# Patient Record
Sex: Female | Born: 1960 | Race: Black or African American | Hispanic: No | Smoking: Current every day smoker
Health system: Southern US, Community
[De-identification: ages and names within clinical notes are randomized; demographics above are authoritative.]

## PROBLEM LIST (undated history)

## (undated) DIAGNOSIS — F419 Anxiety disorder, unspecified: Secondary | ICD-10-CM

## (undated) DIAGNOSIS — F329 Major depressive disorder, single episode, unspecified: Secondary | ICD-10-CM

## (undated) DIAGNOSIS — M25569 Pain in unspecified knee: Secondary | ICD-10-CM

## (undated) DIAGNOSIS — Z8711 Personal history of peptic ulcer disease: Secondary | ICD-10-CM

## (undated) DIAGNOSIS — M25552 Pain in left hip: Secondary | ICD-10-CM

## (undated) DIAGNOSIS — K219 Gastro-esophageal reflux disease without esophagitis: Secondary | ICD-10-CM

## (undated) DIAGNOSIS — J449 Chronic obstructive pulmonary disease, unspecified: Secondary | ICD-10-CM

## (undated) DIAGNOSIS — F32A Depression, unspecified: Secondary | ICD-10-CM

## (undated) DIAGNOSIS — M549 Dorsalgia, unspecified: Secondary | ICD-10-CM

## (undated) DIAGNOSIS — M25579 Pain in unspecified ankle and joints of unspecified foot: Secondary | ICD-10-CM

## (undated) DIAGNOSIS — M87059 Idiopathic aseptic necrosis of unspecified femur: Secondary | ICD-10-CM

## (undated) DIAGNOSIS — Z8719 Personal history of other diseases of the digestive system: Secondary | ICD-10-CM

## (undated) DIAGNOSIS — I10 Essential (primary) hypertension: Secondary | ICD-10-CM

## (undated) DIAGNOSIS — G8929 Other chronic pain: Secondary | ICD-10-CM

## (undated) DIAGNOSIS — F1010 Alcohol abuse, uncomplicated: Secondary | ICD-10-CM

## (undated) DIAGNOSIS — R21 Rash and other nonspecific skin eruption: Secondary | ICD-10-CM

## (undated) HISTORY — PX: WISDOM TOOTH EXTRACTION: SHX21

## (undated) HISTORY — DX: Gastro-esophageal reflux disease without esophagitis: K21.9

## (undated) HISTORY — PX: OTHER SURGICAL HISTORY: SHX169

## (undated) HISTORY — PX: TOTAL ABDOMINAL HYSTERECTOMY: SHX209

## (undated) HISTORY — DX: Pain in left hip: M25.552

## (undated) HISTORY — DX: Chronic obstructive pulmonary disease, unspecified: J44.9

## (undated) HISTORY — PX: TUBAL LIGATION: SHX77

## (undated) HISTORY — DX: Essential (primary) hypertension: I10

## (undated) HISTORY — PX: NASAL SINUS SURGERY: SHX719

---

## 2001-05-22 ENCOUNTER — Encounter: Payer: Self-pay | Admitting: General Surgery

## 2001-05-25 ENCOUNTER — Ambulatory Visit (HOSPITAL_COMMUNITY): Admission: RE | Admit: 2001-05-25 | Discharge: 2001-05-25 | Payer: Self-pay | Admitting: General Surgery

## 2002-10-08 ENCOUNTER — Emergency Department (HOSPITAL_COMMUNITY): Admission: EM | Admit: 2002-10-08 | Discharge: 2002-10-08 | Payer: Self-pay | Admitting: Emergency Medicine

## 2002-10-08 ENCOUNTER — Encounter: Payer: Self-pay | Admitting: Emergency Medicine

## 2003-05-24 ENCOUNTER — Encounter: Payer: Self-pay | Admitting: Emergency Medicine

## 2003-05-24 ENCOUNTER — Encounter: Payer: Self-pay | Admitting: Internal Medicine

## 2003-05-24 ENCOUNTER — Inpatient Hospital Stay (HOSPITAL_COMMUNITY): Admission: EM | Admit: 2003-05-24 | Discharge: 2003-05-27 | Payer: Self-pay | Admitting: Emergency Medicine

## 2003-05-25 ENCOUNTER — Encounter: Payer: Self-pay | Admitting: Cardiology

## 2004-05-29 ENCOUNTER — Observation Stay (HOSPITAL_COMMUNITY): Admission: EM | Admit: 2004-05-29 | Discharge: 2004-05-30 | Payer: Self-pay | Admitting: Emergency Medicine

## 2004-05-29 ENCOUNTER — Ambulatory Visit: Payer: Self-pay | Admitting: Internal Medicine

## 2004-09-14 ENCOUNTER — Emergency Department (HOSPITAL_COMMUNITY): Admission: AC | Admit: 2004-09-14 | Discharge: 2004-09-15 | Payer: Self-pay

## 2004-12-31 ENCOUNTER — Emergency Department (HOSPITAL_COMMUNITY): Admission: EM | Admit: 2004-12-31 | Discharge: 2004-12-31 | Payer: Self-pay | Admitting: Emergency Medicine

## 2007-01-08 ENCOUNTER — Emergency Department (HOSPITAL_COMMUNITY): Admission: EM | Admit: 2007-01-08 | Discharge: 2007-01-08 | Payer: Self-pay | Admitting: Emergency Medicine

## 2007-10-27 ENCOUNTER — Encounter: Payer: Self-pay | Admitting: Orthopedic Surgery

## 2007-10-27 ENCOUNTER — Emergency Department (HOSPITAL_COMMUNITY): Admission: EM | Admit: 2007-10-27 | Discharge: 2007-10-27 | Payer: Self-pay | Admitting: Emergency Medicine

## 2008-01-03 ENCOUNTER — Emergency Department (HOSPITAL_COMMUNITY): Admission: EM | Admit: 2008-01-03 | Discharge: 2008-01-03 | Payer: Self-pay | Admitting: Emergency Medicine

## 2008-01-20 ENCOUNTER — Inpatient Hospital Stay (HOSPITAL_COMMUNITY): Admission: EM | Admit: 2008-01-20 | Discharge: 2008-01-23 | Payer: Self-pay | Admitting: Emergency Medicine

## 2008-03-15 ENCOUNTER — Encounter: Payer: Self-pay | Admitting: Physician Assistant

## 2008-04-11 ENCOUNTER — Encounter: Payer: Self-pay | Admitting: Physician Assistant

## 2008-04-25 ENCOUNTER — Encounter: Payer: Self-pay | Admitting: Physician Assistant

## 2008-05-05 ENCOUNTER — Encounter: Payer: Self-pay | Admitting: Physician Assistant

## 2008-05-05 ENCOUNTER — Ambulatory Visit: Payer: Self-pay | Admitting: Cardiology

## 2008-05-11 ENCOUNTER — Ambulatory Visit: Payer: Self-pay | Admitting: Cardiology

## 2008-10-04 ENCOUNTER — Emergency Department (HOSPITAL_COMMUNITY): Admission: EM | Admit: 2008-10-04 | Discharge: 2008-10-04 | Payer: Self-pay | Admitting: Emergency Medicine

## 2008-11-12 ENCOUNTER — Encounter: Payer: Self-pay | Admitting: Physician Assistant

## 2009-01-07 ENCOUNTER — Encounter: Payer: Self-pay | Admitting: Orthopedic Surgery

## 2009-01-25 ENCOUNTER — Ambulatory Visit (HOSPITAL_COMMUNITY): Admission: RE | Admit: 2009-01-25 | Discharge: 2009-01-25 | Payer: Self-pay | Admitting: Family Medicine

## 2009-01-25 ENCOUNTER — Encounter: Payer: Self-pay | Admitting: Orthopedic Surgery

## 2009-02-01 ENCOUNTER — Ambulatory Visit: Payer: Self-pay | Admitting: Orthopedic Surgery

## 2009-02-01 DIAGNOSIS — M8700 Idiopathic aseptic necrosis of unspecified bone: Secondary | ICD-10-CM | POA: Insufficient documentation

## 2009-02-03 ENCOUNTER — Emergency Department (HOSPITAL_COMMUNITY): Admission: EM | Admit: 2009-02-03 | Discharge: 2009-02-03 | Payer: Self-pay | Admitting: Emergency Medicine

## 2009-02-05 ENCOUNTER — Emergency Department (HOSPITAL_COMMUNITY): Admission: EM | Admit: 2009-02-05 | Discharge: 2009-02-05 | Payer: Self-pay | Admitting: Emergency Medicine

## 2009-02-08 ENCOUNTER — Emergency Department (HOSPITAL_COMMUNITY): Admission: EM | Admit: 2009-02-08 | Discharge: 2009-02-08 | Payer: Self-pay | Admitting: Emergency Medicine

## 2009-04-03 ENCOUNTER — Ambulatory Visit: Payer: Self-pay | Admitting: Orthopedic Surgery

## 2009-05-10 ENCOUNTER — Ambulatory Visit: Payer: Self-pay | Admitting: Orthopedic Surgery

## 2009-05-10 ENCOUNTER — Telehealth: Payer: Self-pay | Admitting: Orthopedic Surgery

## 2009-05-10 DIAGNOSIS — M5126 Other intervertebral disc displacement, lumbar region: Secondary | ICD-10-CM

## 2009-05-25 ENCOUNTER — Emergency Department (HOSPITAL_COMMUNITY): Admission: EM | Admit: 2009-05-25 | Discharge: 2009-05-25 | Payer: Self-pay | Admitting: Emergency Medicine

## 2009-06-08 ENCOUNTER — Encounter: Payer: Self-pay | Admitting: Orthopedic Surgery

## 2009-07-14 ENCOUNTER — Ambulatory Visit (HOSPITAL_COMMUNITY): Admission: RE | Admit: 2009-07-14 | Discharge: 2009-07-14 | Payer: Self-pay | Admitting: Family Medicine

## 2009-07-14 ENCOUNTER — Encounter (INDEPENDENT_AMBULATORY_CARE_PROVIDER_SITE_OTHER): Payer: Self-pay

## 2009-07-14 LAB — CONVERTED CEMR LAB
ALT: 19 units/L
AST: 33 units/L
Alkaline Phosphatase: 59 units/L
BUN: 16 mg/dL
Chloride: 103 meq/L
MCV: 94.4 fL
Platelets: 259 10*3/uL
Potassium: 3.2 meq/L
Sodium: 140 meq/L
Total Protein: 7.5 g/dL

## 2009-07-17 ENCOUNTER — Ambulatory Visit (HOSPITAL_COMMUNITY): Admission: RE | Admit: 2009-07-17 | Discharge: 2009-07-17 | Payer: Self-pay | Admitting: Family Medicine

## 2009-08-24 ENCOUNTER — Ambulatory Visit (HOSPITAL_COMMUNITY): Admission: RE | Admit: 2009-08-24 | Discharge: 2009-08-24 | Payer: Self-pay | Admitting: Family Medicine

## 2009-08-30 ENCOUNTER — Ambulatory Visit (HOSPITAL_COMMUNITY): Payer: Self-pay | Admitting: Oncology

## 2009-09-17 ENCOUNTER — Emergency Department (HOSPITAL_COMMUNITY): Admission: EM | Admit: 2009-09-17 | Discharge: 2009-09-17 | Payer: Self-pay | Admitting: Emergency Medicine

## 2009-09-18 ENCOUNTER — Telehealth: Payer: Self-pay | Admitting: Orthopedic Surgery

## 2009-09-20 ENCOUNTER — Telehealth: Payer: Self-pay | Admitting: Orthopedic Surgery

## 2009-09-22 ENCOUNTER — Encounter: Payer: Self-pay | Admitting: Orthopedic Surgery

## 2009-09-22 ENCOUNTER — Telehealth (INDEPENDENT_AMBULATORY_CARE_PROVIDER_SITE_OTHER): Payer: Self-pay | Admitting: *Deleted

## 2009-09-22 ENCOUNTER — Ambulatory Visit (HOSPITAL_COMMUNITY): Admission: RE | Admit: 2009-09-22 | Discharge: 2009-09-22 | Payer: Self-pay | Admitting: Family Medicine

## 2009-09-25 ENCOUNTER — Ambulatory Visit: Payer: Self-pay | Admitting: Genetic Counselor

## 2009-10-10 ENCOUNTER — Telehealth: Payer: Self-pay | Admitting: Orthopedic Surgery

## 2009-10-11 ENCOUNTER — Ambulatory Visit (HOSPITAL_COMMUNITY): Admission: RE | Admit: 2009-10-11 | Discharge: 2009-10-11 | Payer: Self-pay | Admitting: Orthopedic Surgery

## 2009-10-11 ENCOUNTER — Encounter: Payer: Self-pay | Admitting: Orthopedic Surgery

## 2009-10-13 ENCOUNTER — Ambulatory Visit (HOSPITAL_COMMUNITY): Admission: RE | Admit: 2009-10-13 | Discharge: 2009-10-13 | Payer: Self-pay | Admitting: Family Medicine

## 2009-10-18 ENCOUNTER — Ambulatory Visit: Payer: Self-pay | Admitting: Orthopedic Surgery

## 2009-10-18 ENCOUNTER — Encounter (INDEPENDENT_AMBULATORY_CARE_PROVIDER_SITE_OTHER): Payer: Self-pay | Admitting: *Deleted

## 2009-10-18 DIAGNOSIS — D1620 Benign neoplasm of long bones of unspecified lower limb: Secondary | ICD-10-CM

## 2009-10-18 DIAGNOSIS — M5430 Sciatica, unspecified side: Secondary | ICD-10-CM

## 2009-11-10 ENCOUNTER — Emergency Department (HOSPITAL_COMMUNITY): Admission: EM | Admit: 2009-11-10 | Discharge: 2009-11-10 | Payer: Self-pay | Admitting: Emergency Medicine

## 2009-11-29 ENCOUNTER — Encounter (HOSPITAL_COMMUNITY): Admission: RE | Admit: 2009-11-29 | Discharge: 2009-12-29 | Payer: Self-pay | Admitting: Oncology

## 2009-11-29 ENCOUNTER — Encounter: Payer: Self-pay | Admitting: Orthopedic Surgery

## 2009-11-29 ENCOUNTER — Ambulatory Visit (HOSPITAL_COMMUNITY): Payer: Self-pay | Admitting: Oncology

## 2009-12-13 ENCOUNTER — Telehealth: Payer: Self-pay | Admitting: Orthopedic Surgery

## 2009-12-20 ENCOUNTER — Ambulatory Visit: Payer: Self-pay | Admitting: Orthopedic Surgery

## 2009-12-20 DIAGNOSIS — M23329 Other meniscus derangements, posterior horn of medial meniscus, unspecified knee: Secondary | ICD-10-CM

## 2009-12-21 ENCOUNTER — Encounter: Payer: Self-pay | Admitting: Orthopedic Surgery

## 2009-12-25 ENCOUNTER — Telehealth: Payer: Self-pay | Admitting: Orthopedic Surgery

## 2009-12-26 ENCOUNTER — Telehealth: Payer: Self-pay | Admitting: Orthopedic Surgery

## 2009-12-27 ENCOUNTER — Ambulatory Visit: Payer: Self-pay | Admitting: Orthopedic Surgery

## 2009-12-27 ENCOUNTER — Ambulatory Visit (HOSPITAL_COMMUNITY): Admission: RE | Admit: 2009-12-27 | Discharge: 2009-12-27 | Payer: Self-pay | Admitting: Orthopedic Surgery

## 2010-01-03 ENCOUNTER — Ambulatory Visit: Payer: Self-pay | Admitting: Orthopedic Surgery

## 2010-01-04 ENCOUNTER — Telehealth: Payer: Self-pay | Admitting: Orthopedic Surgery

## 2010-01-06 ENCOUNTER — Encounter: Payer: Self-pay | Admitting: Orthopedic Surgery

## 2010-01-24 ENCOUNTER — Encounter: Payer: Self-pay | Admitting: Orthopedic Surgery

## 2010-02-07 ENCOUNTER — Ambulatory Visit: Payer: Self-pay | Admitting: Orthopedic Surgery

## 2010-02-14 ENCOUNTER — Ambulatory Visit: Payer: Self-pay | Admitting: Oncology

## 2010-03-28 ENCOUNTER — Encounter: Payer: Self-pay | Admitting: Orthopedic Surgery

## 2010-03-29 ENCOUNTER — Ambulatory Visit: Payer: Self-pay | Admitting: Oncology

## 2010-04-04 ENCOUNTER — Ambulatory Visit: Payer: Self-pay | Admitting: Orthopedic Surgery

## 2010-04-19 ENCOUNTER — Ambulatory Visit: Payer: Self-pay | Admitting: Genetic Counselor

## 2010-04-27 ENCOUNTER — Emergency Department (HOSPITAL_COMMUNITY): Admission: EM | Admit: 2010-04-27 | Discharge: 2010-04-27 | Payer: Self-pay | Admitting: Emergency Medicine

## 2010-05-16 ENCOUNTER — Emergency Department (HOSPITAL_COMMUNITY): Admission: EM | Admit: 2010-05-16 | Discharge: 2010-05-17 | Payer: Self-pay | Admitting: Emergency Medicine

## 2010-05-16 ENCOUNTER — Encounter (INDEPENDENT_AMBULATORY_CARE_PROVIDER_SITE_OTHER): Payer: Self-pay

## 2010-05-23 ENCOUNTER — Ambulatory Visit: Payer: Self-pay | Admitting: Oncology

## 2010-05-25 ENCOUNTER — Encounter (INDEPENDENT_AMBULATORY_CARE_PROVIDER_SITE_OTHER): Payer: Self-pay | Admitting: *Deleted

## 2010-06-07 ENCOUNTER — Ambulatory Visit: Payer: Self-pay | Admitting: Orthopedic Surgery

## 2010-06-07 DIAGNOSIS — M25569 Pain in unspecified knee: Secondary | ICD-10-CM | POA: Insufficient documentation

## 2010-06-18 ENCOUNTER — Telehealth: Payer: Self-pay | Admitting: Orthopedic Surgery

## 2010-06-29 ENCOUNTER — Encounter (HOSPITAL_COMMUNITY): Admission: RE | Admit: 2010-06-29 | Discharge: 2010-07-29 | Payer: Self-pay | Admitting: Orthopedic Surgery

## 2010-07-09 ENCOUNTER — Encounter: Payer: Self-pay | Admitting: Orthopedic Surgery

## 2010-08-15 ENCOUNTER — Encounter: Payer: Self-pay | Admitting: Orthopedic Surgery

## 2010-10-04 ENCOUNTER — Ambulatory Visit: Payer: Self-pay | Admitting: Oncology

## 2010-10-07 ENCOUNTER — Encounter: Payer: Self-pay | Admitting: Orthopedic Surgery

## 2010-10-07 ENCOUNTER — Encounter (HOSPITAL_COMMUNITY): Payer: Self-pay | Admitting: Oncology

## 2010-10-07 ENCOUNTER — Encounter: Payer: Self-pay | Admitting: Family Medicine

## 2010-10-08 ENCOUNTER — Encounter: Payer: Self-pay | Admitting: Orthopedic Surgery

## 2010-10-18 NOTE — Miscellaneous (Signed)
Summary: CMP and CBC 07-14-09  Clinical Lists Changes  Observations: Added new observation of CALCIUM: 9.2 mg/dL (01/60/1093 23:55) Added new observation of ALBUMIN: 4.1 g/dL (73/22/0254 27:06) Added new observation of PROTEIN, TOT: 7.5 g/dL (23/76/2831 51:76) Added new observation of SGPT (ALT): 19 units/L (07/14/2009 10:29) Added new observation of SGOT (AST): 33 units/L (07/14/2009 10:29) Added new observation of ALK PHOS: 59 units/L (07/14/2009 10:29) Added new observation of BILI DIRECT: BILI Total: 0.6 mg/dL (16/03/3709 62:69) Added new observation of GFR AA: >60 mL/min/1.58m2 (07/14/2009 10:29) Added new observation of GFR: >60 mL/min (07/14/2009 10:29) Added new observation of CREATININE: 0.66 mg/dL (48/54/6270 35:00) Added new observation of BUN: 16 mg/dL (93/81/8299 37:16) Added new observation of BG RANDOM: 111 mg/dL (96/78/9381 01:75) Added new observation of CO2 PLSM/SER: 29 meq/L (07/14/2009 10:29) Added new observation of CL SERUM: 103 meq/L (07/14/2009 10:29) Added new observation of K SERUM: 3.2 meq/L (07/14/2009 10:29) Added new observation of NA: 140 meq/L (07/14/2009 10:29) Added new observation of PLATELETK/UL: 259 K/uL (07/14/2009 10:29) Added new observation of MCV: 94.4 fL (07/14/2009 10:29) Added new observation of HCT: 41.1 % (07/14/2009 10:29) Added new observation of HGB: 13.9 g/dL (07/10/8526 78:24) Added new observation of WBC COUNT: 10.0 10*3/microliter (07/14/2009 10:29)

## 2010-10-18 NOTE — Progress Notes (Signed)
Summary: Call from patient's PCP  Phone Note From Other Clinic   Summary of Call: Beck Caller: Provider Summary of Call: Kriste Basque from Dr Michelle Nasuti office, patient's primary care physician.  States Dr Sudie Bailey wishes [place] to proceed with ordering MRI.   I relayed that patient had an MRI appt and a fol up appt scheduled, both of which were no-shows.  Letter had been sent to patient re: missed appointments; no response.  - Notes faxed  to their office. Initial call taken by: Cammie Sickle,  September 18, 2009 11:47 AM

## 2010-10-18 NOTE — Assessment & Plan Note (Signed)
Summary: MRI RESULTS BRINGING DISC/MED/MEDICAID/[place]/BSF   Visit Type:  Follow-up Referring Provider:  Dr Sudie Bailey  Primary Provider:  Dr. Sudie Bailey  CC:  back and right knee pain.  History of Present Illness: I saw Pilar Klasen in the office today for a followup visit.  She is a 50 years old woman with the complaint of:  DX:  Right knee and back pain.  Image:  MRI L spine 10/11/09 and MRI Right knee 09/22/09.  Lorcet plus for pain, no relief Neurontin 100mg  three times a day does not help  MEDS: Prednisone pak and Neurontin, Prednisone helped.  Her MRI did not show any compressive disease  Her RIGHT knee only showed infarcts    IMPRESSION: No neural compressive pathology is seen.  There are mild disc bulges at T11-12 and L4-5.    Right knee         no injections or recent therapy. IMPRESSION: Right knee image  Arthritis and meniscal tear.  I would like to get a consult regarding her bone infarcts, have her see Dr Jerelyn Scott.  She may need a peripheral blood smear or bone biopsy.  If nothing else turns up within she might be a candidate for a knee scope.  Allergies: 1)  ! Sulfa 2)  ! 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 Patient Level II (10272)  Problem # 2:  SCIATICA (ICD-724.3) Assessment: Comment Only  seems to have primarily RIGHT hip thigh and leg symptoms were no compressive neuropathy  Referred back to primary care for further workup   Her updated medication list for this problem includes:    Lorcet Plus 7.5-650 Mg Tabs (Hydrocodone-acetaminophen) .Marland Kitchen... 1 by mouth  q 4 as needed  Orders: Est. Patient Level II (53664)  Patient Instructions: 1)  Consult Dr Jerelyn Scott Evaluate for bone infarct possible blood dyscrasias  2)  Refer back to Dr Sudie Bailey  for pain management  3)  Return here after seeing Dr Jerelyn Scott

## 2010-10-18 NOTE — Progress Notes (Signed)
Summary: MRI need to be ordered?  Phone Note Outgoing Call Call back at [facility] Phone [phone]   Summary of Call: called and left pt a message to call here, to ask if she wants MRI L spine ordered so she can be seen here Initial call taken by: Ether Griffins,  September 20, 2009 2:37 PM  Follow-up for Phone Call        patient called back stating that [facility] ordered MRI for her knee and she has appt  to come in here for the MRI results of her knee 09/28/08 that we did not order, in the past she was to get MRI L spine before coming back here because you had examined her knee and she was having radicular symptoms,  do we need to order MRI L spine and go over both results on 09/28/09 or wait for patient to come in for knee results and then order L spine MRI. Follow-up by: Ether Griffins,  September 20, 2009 2:57 PM  Additional Follow-up for Phone Call Additional follow up Details #1::        i dont see a need for mri of the knee   I need one of the spine before she comes back here  Additional Follow-up by: Fuller Canada MD,  September 20, 2009 3:08 PM    Additional Follow-up for Phone Call Additional follow up Details #2::    ok Follow-up by: Ether Griffins,  September 20, 2009 4:21 PM

## 2010-10-18 NOTE — Miscellaneous (Signed)
Summary: Verbal order for equipment  Verbal order for equipment   Imported By: Jacklynn Ganong 03/28/2010 11:35:32  _____________________________________________________________________  External Attachment:    Type:   Image     Comment:   External Document

## 2010-10-18 NOTE — Progress Notes (Signed)
Summary: getting Vicodin from Dr. Hilda Lias  Phone Note Other Incoming   Summary of Call: Warnell Forester at Dr. Sanjuan Dame office called to let you know that Toleen Lachapelle (09/01/1961) is also  getting Vicodin from Dr. Hilda Lias for  Shoulder pain.  They had received a refill request from [facility] in [place] Initial call taken by: Jacklynn Ganong,  June 18, 2010 10:59 AM

## 2010-10-18 NOTE — Progress Notes (Signed)
Summary: MRI appointment re-scheduled  Phone Note Outgoing Call   Summary of Call: Called patient to followup on appointment, MRI of L-spine, as she did not make it to original appointment at [facility].   MRI Re-scheduled, per [facility] for Fri, 10/13/09, register 1:30pm, 2:00pm appt.  Patient to follow up at our office for results. Advised patient, cell # [phone]. Initial call taken by: Cammie Sickle,  October 10, 2009 2:07 PM

## 2010-10-18 NOTE — Progress Notes (Signed)
Summary: please call Dr. Mariel Sleet regarding this pt  Phone Note Other Incoming Call back at [phone] Dr. Mariel Sleet   Summary of Call: would like for you to call him about this patient, he will be in his office on Friday Initial call taken by: Chasity Tereasa Coop,  December 13, 2009 1:45 PM

## 2010-10-18 NOTE — Progress Notes (Signed)
Summary: needs order faxed to [facility]  Phone Note Call from Patient   Summary of Call: Sarah Ochoa (12/29/1960) needs an order faxed to [facility] for a bedside commode and a small wash pan. Her # [id number] Initial call taken by: Jacklynn Ganong,  December 26, 2009 2:57 PM
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
## 2010-10-18 NOTE — Letter (Signed)
Summary: Appointment - Missed  [facility], Main Office  [address]   [place], [place] [address]   Phone: [phone]  Fax: [phone]     May 25, 2010 MRN: [id number]   Sarah Ochoa [address] [place], [place]  [address]   Dear Ms. REY,  Our records indicate you missed your appointment on    Friday, May 25, 2010                    with Dr. Willa Rough   It is very important that we reach you to reschedule this appointment. We look forward to participating in your health care needs. Please contact us at [phone] at your earliest convenience to reschedule this appointment.     Sincerely,    Glass blower/designer

## 2010-10-18 NOTE — Letter (Signed)
Summary: Office Note Dr Mariel Sleet   Office Note Dr Mariel Sleet   Imported By: Cammie Sickle 12/21/2009 11:30:45  _____________________________________________________________________  External Attachment:    Type:   Image     Comment:   External Document

## 2010-10-18 NOTE — Miscellaneous (Signed)
Summary: Discharge from  PT  Discharge from  PT   Imported By: Jacklynn Ganong 08/16/2010 08:47:59  _____________________________________________________________________  External Attachment:    Type:   Image     Comment:   External Document
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
## 2010-10-18 NOTE — Letter (Signed)
Summary: surgery order RT knee sched 12/27/09  surgery order RT knee sched 12/27/09   Imported By: Cammie Sickle 01/01/2010 06:37:07  _____________________________________________________________________  External Attachment:    Type:   Image     Comment:   External Document

## 2010-10-18 NOTE — Assessment & Plan Note (Signed)
Summary: POST OP 1/SARK/SURG 12/27/09/MEDICARE,MEDICAID/CAF   Visit Type:  Follow-up Referring Provider:  Dr Sudie Bailey  Primary Provider:  Dr. Sudie Bailey  CC:  post op 1 sark.  History of Present Illness: I saw Sarah Ochoa in the office today for a followup visit.  She is a 50 years old woman with the complaint of:  post op 1 sark, PMM, drilling, chondroplasty, medial femoral condyle.  Does exercises everyday and uses CPM.  12/27/09 DOS right knee.  POD 7  Has not been weightbearing.  In our wheelchair today with Cryocuff.  No PT.  Taking Norco 7.5 for pain, no Ibuprofen.  Pain is a 7 today.      Allergies: 1)  ! Sulfa 2)  ! 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  Nonweightbearing 6 weeks  Orders: Post-Op Check (16109)  Patient Instructions: 1)  No weight on right foot  2)  Use CPM machine 8 hrs a day  3)  return in 5 weeks

## 2010-10-18 NOTE — Progress Notes (Signed)
Summary: MRI no show, cx fu appt for 1/13/11in our office  Phone Note Outgoing Call Call back at [facility] Phone [phone]   Summary of Call: I called patient to advise her that I cancelled her appt for 09/28/09 in our office, she did not go for MRI L spine on 09/21/09, so no need to fu here until she has MRI per Dr. Rexene Edison      No answer or machine when I called we will have to get in touch with her, will try calling again later. Initial call taken by: Ether Griffins,  September 22, 2009 8:51 AM  Follow-up for Phone Call        no answer again Follow-up by: Ether Griffins,  September 22, 2009 11:04 AM  Additional Follow-up for Phone Call Additional follow up Details #1::        Patient called today to relay that she had the MRI of the RT knee 09/22/09 at [facility], 9AM today.   States that she is to return there for MRI of her back on Tues, 09/26/09, 12:30PM register. Additional Follow-up by: Cammie Sickle,  September 22, 2009 11:59 AM

## 2010-10-18 NOTE — Assessment & Plan Note (Signed)
Summary: RECK RT KNEE POST OP 12/27/09/MEDICARE/MEDICAID/   Visit Type:  Follow-up Referring Provider:  Dr Sudie Bailey  Primary Provider:  Dr. Sudie Bailey  CC:  recheck right knee.  History of Present Illness: 50 year old female status post arthroscopy RIGHT knee partial medial meniscectomy drilling chondroplasty medial femoral condyle on April 13  Medications Norco 5 mg request refill  Patient states she is doing a lot better only has pain when the weather is changing or him when it's raining and she also says it pops at times    Allergies: 1)  ! Sulfa 2)  ! 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 1 q 6 as needed pain  Patient Instructions: 1)  You are being released today you do not have to return  2)  You have 6 weeks of pain medication  3)  wear your brace as needed  Prescriptions: NORCO 5-325 MG TABS (HYDROCODONE-ACETAMINOPHEN) 1 q 6 as needed pain  #56 x 2   Entered and Authorized by:   Fuller Canada MD   Signed by:   Fuller Canada MD on 04/04/2010   Method used:   Print then Give to Patient   RxID:   [id number]

## 2010-10-18 NOTE — Progress Notes (Signed)
Summary: question from patient about driving  Phone Note Call from Patient   Caller: Patient Summary of Call: Patient asking when she can drive.  Ph [phone] Initial call taken by: Cammie Sickle,  January 04, 2010 4:19 PM  Follow-up for Phone Call        advised patient that she is not weightbearing yet, so when she comes back in 5 weeks Dr will let her know. Follow-up by: Ether Griffins,  January 04, 2010 4:25 PM

## 2010-10-18 NOTE — Progress Notes (Signed)
Summary: potassium low  Phone Note Outgoing Call   Summary of Call:  Takes Potassium 10 meg everyday already, preop today for wed surgery potassium is 3.3 any suggestions? Initial call taken by: Ether Griffins,  December 25, 2009 1:27 PM  Follow-up for Phone Call        take 2 tabs a day starting to day Follow-up by: Fuller Canada MD,  December 25, 2009 1:33 PM  Additional Follow-up for Phone Call Additional follow up Details #1::        advised patient Additional Follow-up by: Ether Griffins,  December 25, 2009 1:49 PM

## 2010-10-18 NOTE — Letter (Signed)
Summary: Out of Work  [facility]  [address]  [place], [place] [address]   Phone: [phone]  Fax: [phone]    January 03, 2010   Employee:  Sarah Ochoa    To Whom It May Concern:   For Medical reasons, please excuse the above named :  Start:   01/04/2010  End:   01/20/2010  If you need additional information, please feel free to contact our office.         Sincerely,    Fuller Canada MD

## 2010-10-18 NOTE — Assessment & Plan Note (Signed)
Summary: 5 WK RE-CK RT FOOT/POST OP 12/27/09/MEDICARE,MEDICAI/[facility]F   Visit Type:  Follow-up Referring Provider:  Dr Sudie Bailey  Primary Provider:  Dr. Sudie Bailey  CC:  post op right knee.  History of Present Illness: I saw Sarah Ochoa in the office today for a 5 week  followup visit.  She is a 50 years old woman with the complaint of:  post op, right knee.  post op 2  sark, PMM, drilling, chondroplasty, medial femoral condyle.  Does exercises everyday.  12/27/09 DOS right knee.  week # 6   Meds: Norco 7.5  complains of a popping sensation when she goes from a flexed position to the extended position, complains of lateral knee pain.    Allergies: 1)  ! Sulfa 2)  ! Codeine 3)  Codeine Phosphate (Codeine Phosphate) 4)  Sulfamethoxazole (Sulfamethoxazole)   Knee Exam  General:    Well-developed, well-nourished, normal body habitus; no deformities, normal grooming.  Gait:    she is using crutches and/or a cane, partial weightbearing  Skin:    portal sites are normal  Inspection:    no swelling today  Palpation:    tenderness lateral patella femoral joint line  Vascular:    There was no swelling or varicose veins. The pulses and temperature are normal. There was no edema or tenderness.  Motor:    Motor strength 5/5 bilaterally for quadriceps, hamstrings, ankle dorsiflexion, and ankle plantar flexion.    Knee Exam:    Right:    Inspection/Palpation:  she has painful range of motion with patellar compression throughout the arc of 0-90, the knee is stable. The medial side is nontender. Range of motion is full   Impression & Recommendations:  Problem # 1:  DERANGEMENT OF POSTERIOR HORN OF MEDIAL MENISCUS (ICD-717.2) Assessment Improved  Orders: Post-Op Check (60454)  Problem # 2:  ASEPTIC NECROSIS (ICD-733.40) Assessment: Improved  Orders: Post-Op Check (09811)  Patient Instructions: 1)  Resume normal walking  2)  return in 6 weeks  3)  wear knee sleeve

## 2010-10-18 NOTE — Letter (Signed)
Summary: Letter fax [facility]  Letter fax [facility]   Imported By: Cammie Sickle 01/06/2010 10:49:39  _____________________________________________________________________  External Attachment:    Type:   Image     Comment:   External Document

## 2010-10-18 NOTE — Miscellaneous (Signed)
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

## 2010-10-19 NOTE — Miscellaneous (Signed)
Summary: PT Clinical  Evaluation  PT Clinical  Evaluation   Imported By: Jacklynn Ganong 07/09/2010 13:34:28  _____________________________________________________________________  External Attachment:    Type:   Image     Comment:   External Document

## 2010-10-19 NOTE — Miscellaneous (Signed)
Summary: Home Care Report  Home Care Report   Imported By: Elvera Maria 01/26/2010 10:35:40  _____________________________________________________________________  External Attachment:    Type:   Image     Comment:   advanced order

## 2010-10-19 NOTE — Consult Note (Signed)
Summary: Consult report from Dr. Mariel Sleet  Consult report from Dr. Mariel Sleet   Imported By: Jacklynn Ganong 12/14/2009 14:59:44  _____________________________________________________________________  External Attachment:    Type:   Image     Comment:   External Document

## 2010-12-01 LAB — BASIC METABOLIC PANEL
CO2: 27 mEq/L (ref 19–32)
Calcium: 9.2 mg/dL (ref 8.4–10.5)
Creatinine, Ser: 0.93 mg/dL (ref 0.4–1.2)
Glucose, Bld: 111 mg/dL — ABNORMAL HIGH (ref 70–99)

## 2010-12-05 LAB — BASIC METABOLIC PANEL
CO2: 28 mEq/L (ref 19–32)
Chloride: 106 mEq/L (ref 96–112)
Creatinine, Ser: 0.7 mg/dL (ref 0.4–1.2)
GFR calc Af Amer: >60 mL/min (ref >60)
Sodium: 141 mEq/L (ref 135–145)

## 2010-12-05 LAB — RAPID STREP SCREEN (MED CTR MEBANE ONLY): Streptococcus, Group A Screen (Direct): POSITIVE — AB

## 2010-12-09 LAB — CBC
Hemoglobin: 14 g/dL (ref 12.0–15.0)
MCHC: 34.1 g/dL (ref 30.0–36.0)
MCV: 92.7 fL (ref 78.0–100.0)
RBC: 4.42 MIL/uL (ref 3.87–5.11)
WBC: 9 10*3/uL (ref 4.0–10.5)

## 2010-12-09 LAB — DIFFERENTIAL
Eosinophils Absolute: 0.2 10*3/uL (ref 0.0–0.7)
Lymphs Abs: 3.3 10*3/uL (ref 0.7–4.0)
Monocytes Absolute: 0.8 10*3/uL (ref 0.1–1.0)
Monocytes Relative: 9 % (ref 3–12)
Neutro Abs: 4.6 10*3/uL (ref 1.7–7.7)
Neutrophils Relative %: 51 % (ref 43–77)

## 2010-12-09 LAB — HEMOGLOBINOPATHY EVALUATION
Hemoglobin Other: 0 % (ref 0.0–0.0)
Hgb F Quant: 0 % (ref 0.0–2.0)
Hgb S Quant: 0 % (ref 0.0–0.0)

## 2010-12-20 LAB — COMPREHENSIVE METABOLIC PANEL
ALT: 19 U/L (ref 0–35)
AST: 33 U/L (ref 0–37)
Albumin: 4.1 g/dL (ref 3.5–5.2)
Alkaline Phosphatase: 59 U/L (ref 39–117)
CO2: 29 mEq/L (ref 19–32)
Chloride: 103 mEq/L (ref 96–112)
GFR calc Af Amer: >60 mL/min (ref >60)
Potassium: 3.2 mEq/L — ABNORMAL LOW (ref 3.5–5.1)
Total Bilirubin: 0.6 mg/dL (ref 0.3–1.2)

## 2010-12-20 LAB — DIFFERENTIAL
Basophils Relative: 1 % (ref 0–1)
Eosinophils Absolute: 0.2 10*3/uL (ref 0.0–0.7)
Eosinophils Relative: 2 % (ref 0–5)
Monocytes Absolute: 0.9 10*3/uL (ref 0.1–1.0)
Monocytes Relative: 9 % (ref 3–12)

## 2010-12-20 LAB — CBC
Platelets: 259 10*3/uL (ref 150–400)
RBC: 4.35 MIL/uL (ref 3.87–5.11)
WBC: 10 10*3/uL (ref 4.0–10.5)

## 2010-12-31 LAB — BASIC METABOLIC PANEL
BUN: 13 mg/dL (ref 6–23)
CO2: 25 mEq/L (ref 19–32)
Calcium: 8.2 mg/dL — ABNORMAL LOW (ref 8.4–10.5)
Glucose, Bld: 101 mg/dL — ABNORMAL HIGH (ref 70–99)
Sodium: 141 mEq/L (ref 135–145)

## 2010-12-31 LAB — POCT CARDIAC MARKERS
Myoglobin, poc: 24.6 ng/mL (ref 12–200)
Troponin i, poc: <0.05 ng/mL (ref 0.00–0.09)

## 2010-12-31 LAB — DIFFERENTIAL
Basophils Absolute: 0 10*3/uL (ref 0.0–0.1)
Basophils Relative: 0 % (ref 0–1)
Eosinophils Relative: 2 % (ref 0–5)
Monocytes Absolute: 1 10*3/uL (ref 0.1–1.0)
Neutro Abs: 4.8 10*3/uL (ref 1.7–7.7)

## 2010-12-31 LAB — CBC
Hemoglobin: 12.3 g/dL (ref 12.0–15.0)
MCHC: 32.5 g/dL (ref 30.0–36.0)
Platelets: 166 10*3/uL (ref 150–400)
RDW: 15.5 % (ref 11.5–15.5)
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
## 2011-01-29 NOTE — H&P (Signed)
Sarah Ochoa, Sarah Ochoa                ACCOUNT NO.:  [id number]   MEDICAL RECORD NO.:  [id number]          PATIENT TYPE:  INP   LOCATION:  A318                          FACILITY:  [facility]   PHYSICIAN:  Skeet Latch, DO    DATE OF BIRTH:  [birth year]   DATE OF ADMISSION:  01/20/2008  DATE OF DISCHARGE:  LH                              HISTORY & PHYSICAL   PRIMARY CARE PHYSICIAN:  Delaney Meigs, M.D.   CHIEF COMPLAINT:  Cough.   HISTORY OF PRESENT ILLNESS:  This is a 50 year old African American  female who presents with complaint of cough.  The patient states that  she has been having productive cough for the last 2-3 days.  She saw her  PCP yesterday and was started on Zithromax.  The patient states she  continues to have fever, chills and constant cough and states that she  fell like she was getting worse and she decided to come to the emergency  room for evaluation.  Upon being seen in the emergency room chest x-ray  was performed which showed mild basilar pneumonia notable in the lateral  view posteriorly.  The patient seems to be stable.  No acute distress at  this time but still has cough.   PAST MEDICAL HISTORY:  Includes asthma, hypertension and  gastroesophageal reflux disease.   PAST SURGICAL HISTORY:  Hysterectomy, hernia repair.   FAMILY HISTORY:  Unremarkable.   SOCIAL HISTORY:  She is a current smoker one-half pack per day for over  30 years.  No history of drug or alcohol abuse.   HOME MEDICATIONS:  1. Include Singulair 10 mg at bedtime.  2. Amitriptyline 50 mg at bedtime.  3. Diovan 160 mg once daily.  4. ProAir HFA as needed.  5. Ventolin inhaler as needed.  6. Advair Diskus 250/50 mg twice a day.  7. She was on a Z-Pak.  8. She is also on Xanax 0.5 mg as needed.  9. Tussicaps 10/8 mg twice a day.  10.Clotrimazole topical twice daily.   DRUG ALLERGIES:  SULFA and CODEINE.   REVIEW OF SYSTEMS:  CONSTITUTIONAL:  Positive for fever, decreased  appetite,  chills and fatigue.  HEENT:  Slight sore throat, nasal  congestion.  CARDIOVASCULAR:  No chest pain, palpitation.  RESPIRATORY:  Positive for cough, slight wheeze.  GASTROINTESTINAL:  No nausea,  vomiting, diarrhea.  GENITOURINARY:  Unremarkable.  MUSCULOSKELETAL:  Positive for some myalgias.  SKIN:  No rashes, no pruritus.  NEUROLOGICAL:  Positive for a slight headache.  Other systems are  negative.   PHYSICAL EXAM:  VITAL SIGNS:  Temperature 98.5, pulse 99, respirations  18, blood pressure 114/73.  GENERAL:  She is well-developed, well-nourished, well-hydrated, in no  acute distress.  HEENT:  Head is atraumatic, normocephalic.  Eyes PERRLA.  EOMI.  NECK:  Soft, supple, nontender, nondistended.  CARDIOVASCULAR:  Regular rate and rhythm.  No murmurs, rubs or gallops.  LUNGS:  Slight wheeze, otherwise clear.  No rhonchi, no rales.  ABDOMEN:  Abdomen is soft, nontender, nondistended.  No rigidity or  guarding.  Positive bowel sounds.  EXTREMITIES:  No clubbing, cyanosis or edema.  NEUROLOGICAL:  She is awake and alert.  Cranial nerves II-XII are  grossly intact.   LABS:  Her white count is 11,800, hemoglobin 13.0, hematocrit 36.6,  platelets 213.  Sodium 143, potassium 2.6, chloride 106, CO2 29, glucose  113, BUN 7, creatinine 0.94.  Chest x-ray showed mild basilar pneumonia  notable in the lateral view posteriorly.   ASSESSMENT:  1. Pneumonia.  2. History of asthma.  3. History of hypertension.  4. History of GERD (gastroesophageal reflux disease).   PLAN:  1. The patient will be admitted to a general medical bed.  For her      pneumonia the patient will be placed on IV Levaquin.  Will obtain a      sputum culture and sensitivity and incentive spirometry to her      bedside.  The patient also will receive nebulizer treatments q.4 h      and as needed.  2. Leukocytosis probably secondary to pneumonia.  We will continue to      monitor closely.  3. History of hypertension.  The  patient states she is on a medication      of unknown name and dose at this time.  We will try to reconcile      her home hypertensive medicine and place the patient when      available.  4. For her gastroesophageal reflux disease.  The patient will be      placed on Protonix daily.  5. For her nicotine abuse.  The patient will placed on nicotine patch      and receive counseling regarding tobacco cessation.  6. The patient will also be on DVT as well as GI prophylaxis.      Skeet Latch, DO  Electronically Signed     SM/MEDQ  D:  01/20/2008  T:  01/20/2008  Job:  [id number]   cc:   Delaney Meigs, M.D.  Fax: [phone]

## 2011-01-29 NOTE — Discharge Summary (Signed)
Sarah Ochoa, Sarah Ochoa                ACCOUNT NO.:  [id number]   MEDICAL RECORD NO.:  [id number]          PATIENT TYPE:  INP   LOCATION:  A318                          FACILITY:  [facility]   PHYSICIAN:  Osvaldo Shipper, MD     DATE OF BIRTH:  03-19-1961   DATE OF ADMISSION:  01/20/2008  DATE OF DISCHARGE:  05/09/2009LH                               DISCHARGE SUMMARY   PRIMARY MEDICAL DOCTOR:  Delaney Meigs, M.D.   DISCHARGE DIAGNOSES:  1. Community-acquired pneumonia.  2. Hypokalemia, resolved.  3. History of asthma.  4. Depression.  5. Hypertension.   BRIEF HOSPITAL COURSE:  Briefly, this is a 50 year old African-American  female who has a history of asthma, depression, hypertension, who  presented to the ED complaining of shortness of breath and cough.  She  underwent a chest x-ray which suggested pneumonia.  The patient was  evaluated in the ED where she was found to have an elevated white count.  The patient was subsequently admitted to the hospital.  She was started  on Levaquin.  She was also found to be severely hypokalemic with a  potassium of 2.6.  This was also aggressively replaced.  Potassium has  come up to 4.0 as of yesterday.  Her elevated white count has resolved.  Her pneumonia is actually better.  Her examination also improved.  She  is saturating more than 94% on room air.   She also started complaining of left ear pain.  Examination of the left  ear suggested the middle ear infection.  She is on Levaquin which should  cover any organisms which can cause middle ear infection.  I did not see  any obvious swelling around the left ear.  She does have some enlarged  cervical lymph nodes.  I have told her that if her ear pain does not  improve in the next few days, her doctor may consider referring her to  an ENT specialist.  I have explained to her that ENT is not available in  this hospital during weekends and most of the week days as well.   The patient was also  mildly anemic for which an iron profile study was  done which showed low iron and low ferritin as well, so she was started  on Nu-Iron.  The patient to discuss with her PMD regarding further  evaluation of her iron-deficiency anemia.  Her hemoglobin here was 12.1.  It did drop down to 11.8.   Her other medical issues are all stable.  This morning she was  complaining of the left ear pain which she says is slightly better, but  her breathing is improved.  Cough is improved.  Examination reveals that  her lungs clear to auscultation bilaterally.  Examination of the HEENT  was described above.  Vital signs are all stable, so she is considered  stable for discharge.   DISCHARGE MEDICATIONS:  1. Levaquin 750 mg daily for 3 days.  2. Nu-Iron 150 mg b.i.d.  3. Tessalon 200 mg t.i.d. for 5 days.  4. Oxycodone 5 mg q.6 h. p.r.n. for pain,  15 tablets prescribed.  5. Otherwise she may continue her home medications as follows:      Singulair 10 mg daily.  6. Albuterol nebulizers as needed.  7. Advair inhaler b.i.d.  8. Amitriptyline 50 mg at night time.  9. Diovan 160 mg every day.  10.Xanax as needed.  11.Clotrimazole topically twice daily.  12.She has been asked to discontinue her azithromycin.   DIET:  No restrictions.   PHYSICAL ACTIVITY:  No restrictions.   FOLLOW UP:  Follow up with PMD in 1 week.   Total time on discharge encounter 35 minutes.      Osvaldo Shipper, MD  Electronically Signed     GK/MEDQ  D:  01/23/2008  T:  01/23/2008  Job:  [id number]   cc:   Delaney Meigs, M.D.  Fax: [phone]

## 2011-01-29 NOTE — Group Therapy Note (Signed)
NAMESAVVY, PEETERS                ACCOUNT NO.:  [id number]   MEDICAL RECORD NO.:  [id number]          PATIENT TYPE:  INP   LOCATION:  A318                          FACILITY:  [facility]   PHYSICIAN:  Osvaldo Shipper, MD     DATE OF BIRTH:  [birth year]   DATE OF PROCEDURE:  01/21/2008  DATE OF DISCHARGE:                                 PROGRESS NOTE   PRIMARY CARE PHYSICIAN:  Delaney Meigs, M.D.   SUBJECTIVE:  The patient says she has not shown any improvement,  continues to cough with  yellowish white expectoration.  She also is  complaining of some pleuritic chest pain in the retrosternal area  worsened with deep breathing and cough.  She admitted to some fever and  chills at home as well.  These symptoms have been ongoing 2-3 days.   OBJECTIVE:  VITAL SIGNS:  She is afebrile at 97.6, heart rate 83,  respiratory rate 20, blood pressure 109/74 and saturation 96% on room  air.  LUNGS:  Reveal crackles in the right 1/3 of the lung at the bases.  The  left lung is zone mostly clear.  No wheezes appreciated.  CARDIOVASCULAR:  S1, S2 is normal, regular.  ABDOMEN:  Soft.  EXTREMITIES:  Show no edema.   LABORATORY DATA:  This morning white count is normal, hemoglobin is  11.8, lower from 13.0 yesterday, platelet count is 197. Potassium 3.3,  other parameters are all normal.   ASSESSMENT/PLAN:  1. Acute community-acquired pneumonia involving mostly the right lung.      This is made on clinical examination this morning.  She is on IV      Levaquin which will be continued. Her nebulizer treatment frequency      will be decreased today.  The rest of her medications will be      continued.  She is on [employer] as well.  2. She also has a history of asthma for which she is on maintenance      medications which will be continued.  3. She also has a history of psych disorder which appears to be stable      at this point.  She has a history of hypertension as well.   Continue current  treatment, anticipate improvement in the next day or  two.  If not we will have to obtain repeat imaging studies.      Osvaldo Shipper, MD  Electronically Signed    GK/MEDQ  D:  01/21/2008  T:  01/21/2008  Job:  [id number]

## 2011-01-29 NOTE — Group Therapy Note (Signed)
NAMEDENA, ESPERANZA                ACCOUNT NO.:  [id number]   MEDICAL RECORD NO.:  [id number]          PATIENT TYPE:  INP   LOCATION:  A318                          FACILITY:  [facility]   PHYSICIAN:  Osvaldo Shipper, MD     DATE OF BIRTH:  1961/05/01   DATE OF PROCEDURE:  01/22/2008  DATE OF DISCHARGE:                                 PROGRESS NOTE   SUBJECTIVE:  She feels better this morning.  Continues to have a cough.  She is also complaining of some left ear stuffiness.  Otherwise, the  breathing is better.  No chest pain.   OBJECTIVE:  VITAL SIGNS:  Stable.  She is afebrile, saturating 99% on  room air.  LUNGS:  Today clear to auscultation bilaterally.  The crackles I heard  yesterday have completely disappeared this morning.  CARDIAC:  Unremarkable.  ABDOMEN:  Soft.  EXTREMITIES:  No edema.   Labs this morning:  Her CBC shows stable hemoglobin.  Other parameters  are OK.  BMET is still pending.  Blood cultures are not growing anything  at this point.   ASSESSMENT:  1. Community-acquired pneumonia.  She is on Levaquin, which we will      switch to p.o. today.  She appears to be improving clinically.  She      is on [employer] as well for her cough.  She is on nebulizer      treatments as well, which she is getting every 8 hours.  So overall      the patient has continued to show improvement.  2. Hypokalemia.  We are awaiting a BMET this morning to see if she      needs more replacement of her potassium.  3. History of asthma, depression and hypertension are all stable at      this point.   So I am anticipating the patient going home tomorrow morning.  I have  told her of this.      Osvaldo Shipper, MD  Electronically Signed     GK/MEDQ  D:  01/22/2008  T:  01/22/2008  Job:  [id number]

## 2011-01-29 NOTE — Group Therapy Note (Signed)
NAMECRYSTIN, Sarah Ochoa                ACCOUNT NO.:  [id number]   MEDICAL RECORD NO.:  [id number]          PATIENT TYPE:  INP   LOCATION:  A318                          FACILITY:  [facility]   PHYSICIAN:  Lucita Ferrara, MD         DATE OF BIRTH:  [birth year]   DATE OF PROCEDURE:  DATE OF DISCHARGE:                                 PROGRESS NOTE   Addendum   This is an addendum to Dr. Chancy Milroy progress note.   Upon questioning of how patient was doing overnight, the patient states  that she has left ear fullness.  Otoscopic examination shows a middle  ear infection.  Given the patient is already on antibiotics with  Levaquin, I will keep the current treatment.  If the patient's symptoms  progress, she may need ENT referral versus a CT scan of the head and  neck.  Will continue to monitor her progress.      Lucita Ferrara, MD  Electronically Signed     RR/MEDQ  D:  01/22/2008  T:  01/22/2008  Job:  [id number]

## 2011-02-01 NOTE — Consult Note (Signed)
NAME:  Sarah Ochoa, Sarah Ochoa                          ACCOUNT NO.:  [id number]   MEDICAL RECORD NO.:  [id number]                   PATIENT TYPE:  EMS   LOCATION:  MAJO                                 FACILITY:  [facility]   PHYSICIAN:  Melvyn Novas, M.D.               DATE OF BIRTH:  [birth year]   DATE OF CONSULTATION:  05/29/2004  DATE OF DISCHARGE:                                   CONSULTATION   This is a 50 year old African-American right-handed female with a history of  posttraumatic stress disorder, nonvascular stroke symptoms in the past, and  had __________  evaluation for stroke and stroke risk factors last year in  September the same week.  She presents this time to the ER with identical  symptoms.  The patient, besides a history of hypertension, has no risk  factors.  She is taking hydrochlorothiazide at a low dose and potassium  supplements her diet.  The patient's primary care physician is Dr.  __________  in [place], [place].  Her psychiatric care is unknown.  She  was seen by Dr. Jeanie Sewer in the hospital and previously admitted to the  teaching service under Dr. Lonia Blood.  Her family history:  The patient  lives with one of her daughters.  She indicates that there is a lot of home  stress, but does not give [place] any detail.  She presents today with noticing  left-sided grip weakness upon awakening between 7-7:30 in the morning.  Her  grip strength varies very much during the examination and during the  interview here.  She also presents with aphasia and dysarthria, but  maintains an active jaw deviation to the right while her tongue deviates to  the left.  Both require significant muscular strength and I cannot overcome  her jaw deviation to the right indicating that this is a willful  presentation and not a sign of a stroke.   PHYSICAL EXAMINATION:  VITAL SIGNS:  The patient has 150/80 blood pressure,  heart rate 62, respiratory rate 16.  Her temperature is 98 degrees  Fahrenheit.  GENERAL:  She is tearful.  EXTREMITIES:  She has no clubbing, cyanosis.  NEUROLOGIC:  She can speak, but speaks in a very unintelligible fashion and  her jaw remains deviated while she is trying to speak.  She does not open  her jaw when she tries to speak.  Asked to open her jaw for the neurologic  evaluation.  The deviation of the jaw disappears.  She is also able to  wiggle her jaw from the left to the right.  She shows full extraocular  movements, full pupillary reactions.  No papilledema.  No visual field  deficits.  Tongue and uvula are moving and are in midline.  No  fasciculation.  No atrophy.  The forehead is not involved, neither is the  eye closure.  There is no sensory abnormality over the face but  the patient  states that she has jaw pain.   Deep tendon reflexes are equal with downgoing toes to plantar stimulation.  No clonus.  Sensory:  States that she feels nothing in left hand and foot,  but responds with downgoing Babinski to plantar responses.  Coordination:  On finger-nose test she produces dysmetria upon reaching the goal of the  nose tip within 3 inches, then starts to have dysmetria.  There is no tremor  and no ataxia and no over shooting movements.  Gait and stance show astasia  and abasia.  She can maintain her stance without assistance.   ASSESSMENT:  Likely nonorganic origin of various deficits that are not  likely to originate from the same lesion.  I do wonder about severe  depression.  The patient is tearful and has a history of posttraumatic  stress disorder.  She does not provide [place] with the number of her mental  health caretaker and I doubt that she has one on the outside.   PLAN:  Obtain an MRI with diffusion weighted image.  Although her CT was  negative, it will be helpful to verify that there is truly no stroke.  She  will receive 1 mg of Ativan prior to the study and she needs to establish  primary care outside of [place] since she  attempts to present here to the  hospital and not in [place].  She also needs to establish a connection with a  mental health professional.                                               Melvyn Novas, M.D.    CD/MEDQ  D:  05/29/2004  T:  05/29/2004  Job:  [id number]
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
## 2011-04-17 ENCOUNTER — Ambulatory Visit (HOSPITAL_COMMUNITY)
Admission: RE | Admit: 2011-04-17 | Discharge: 2011-04-17 | Disposition: A | Discharge status: Home / Self Care | Payer: Medicare Other | Source: Ambulatory Visit | Attending: Family Medicine | Admitting: Family Medicine

## 2011-04-17 ENCOUNTER — Other Ambulatory Visit (HOSPITAL_COMMUNITY): Payer: Self-pay | Admitting: Family Medicine

## 2011-04-17 DIAGNOSIS — M545 Low back pain, unspecified: Secondary | ICD-10-CM | POA: Insufficient documentation

## 2011-04-17 DIAGNOSIS — M25571 Pain in right ankle and joints of right foot: Secondary | ICD-10-CM

## 2011-04-17 DIAGNOSIS — M25579 Pain in unspecified ankle and joints of unspecified foot: Secondary | ICD-10-CM | POA: Insufficient documentation

## 2011-06-11 ENCOUNTER — Encounter: Payer: Self-pay | Admitting: Orthopedic Surgery

## 2011-06-11 ENCOUNTER — Ambulatory Visit (INDEPENDENT_AMBULATORY_CARE_PROVIDER_SITE_OTHER): Payer: Medicare Other | Admitting: Orthopedic Surgery

## 2011-06-11 VITALS — BP 128/82 | Ht 64.0 in | Wt 138.0 lb

## 2011-06-11 DIAGNOSIS — M25579 Pain in unspecified ankle and joints of unspecified foot: Secondary | ICD-10-CM

## 2011-06-11 DIAGNOSIS — M24873 Other specific joint derangements of unspecified ankle, not elsewhere classified: Secondary | ICD-10-CM

## 2011-06-11 DIAGNOSIS — M25373 Other instability, unspecified ankle: Secondary | ICD-10-CM | POA: Insufficient documentation

## 2011-06-11 NOTE — Progress Notes (Signed)
Chief complaint RIGHT foot and ankle pain  Patient referred by Dr. Sudie Bailey  History the patient presents with a 2-3 month history of recurrent ankle sprain.  She indicates multiple ankle sprains over the years complains of medial pain beneath the medial malleolus which she describes as a 10 out of 10.  She describes it as constant and sharp associated with locking and swelling and again frequent instability episodes.  She has several positive review of systems including shortness of breath chest pain heartburn nervousness easy bruising seasonal ALLERGIES blurred vision and weight gain  Her medical history has been reviewed including family history, social history and surgical history see recorded data  Exam reveals a well-developed well-nourished female who is ectomorphic in terms of her body habitus, she has excellent groaning and normal hygiene.  Her vital signs are stable. She is oriented x3, her mood and affect are normal  She ambulates favoring the RIGHT lower extremity  Her RIGHT ankle is tender in the medial malleolus she is hypersensitive if not over reacting to palpation making her diagnosis in question  The range of motion in the ankle is normal the ankle does not have any detectable stability to manual testing.  She has weakness in her toe extensors and dorsiflexors which is chronic and may be related to her degenerative disc disease.  The skin is normal although dark in color.  Shows normal pulse and temperature.  She has hypersensitivity to the foot.  No pathologic reflexes.  Coordination is normal.  X-ray was taken at the hospital and it included 3 views of her ankle it was done within the last month or so.  It is negative.  There is an accessory ossicle under the medial malleolus.  Although I really feel that there is not much going on here I recommended an MRI and a Cam Walker to fully evaluate the situation.  If the MRI is normal patient will be discharged.  He is to wear the  Cam Walker for 6 weeks.  She may benefit from some physical therapy.

## 2011-06-11 NOTE — Patient Instructions (Addendum)
Use heat or ice   Send MRI right ankle   Take tylenol or advil for pain   Short Cam walker x 6 weeks

## 2011-06-12 LAB — DIFFERENTIAL
Basophils Absolute: 0
Basophils Absolute: 0
Basophils Relative: 1
Basophils Relative: 1
Basophils Relative: 1
Eosinophils Absolute: 0.5
Eosinophils Absolute: 0.5
Eosinophils Absolute: 0.6
Eosinophils Relative: 5
Eosinophils Relative: 6 — ABNORMAL HIGH
Eosinophils Relative: 7 — ABNORMAL HIGH
Lymphocytes Relative: 45
Lymphs Abs: 3.6
Lymphs Abs: 3.8
Lymphs Abs: 3.9
Monocytes Absolute: 0.8
Monocytes Absolute: 0.9
Monocytes Relative: 10
Monocytes Relative: 7
Neutro Abs: 3.2
Neutrophils Relative %: 32 — ABNORMAL LOW
Neutrophils Relative %: 39 — ABNORMAL LOW

## 2011-06-12 LAB — CULTURE, BLOOD (ROUTINE X 2)
Culture: NO GROWTH
Report Status: 5122009

## 2011-06-12 LAB — BASIC METABOLIC PANEL
BUN: 9
BUN: 9
CO2: 27
CO2: 27
CO2: 29
Calcium: 8.1 — ABNORMAL LOW
Calcium: 9
Chloride: 106
Chloride: 109
Creatinine, Ser: 0.73
Creatinine, Ser: 0.98
GFR calc Af Amer: >60
GFR calc Af Amer: >60
GFR calc non Af Amer: >60
Glucose, Bld: 137 — ABNORMAL HIGH
Glucose, Bld: 84
Glucose, Bld: 97
Potassium: 2.6 — CL
Potassium: 4
Sodium: 137
Sodium: 139

## 2011-06-12 LAB — CBC
HCT: 34.1 — ABNORMAL LOW
HCT: 34.4 — ABNORMAL LOW
HCT: 36.6
Hemoglobin: 12.1
MCHC: 34.6
MCHC: 35
MCHC: 35.5
MCV: 90.5
MCV: 91
MCV: 91.1
Platelets: 197
Platelets: 206
RBC: 3.78 — ABNORMAL LOW
RBC: 4.04
RDW: 14.7
RDW: 14.8
WBC: 11.8 — ABNORMAL HIGH
WBC: 7.5
WBC: 8.2

## 2011-06-12 LAB — URINALYSIS, ROUTINE W REFLEX MICROSCOPIC
Bilirubin Urine: NEGATIVE
Glucose, UA: NEGATIVE
Ketones, ur: NEGATIVE
Leukocytes, UA: NEGATIVE
Nitrite: NEGATIVE
Protein, ur: NEGATIVE
Specific Gravity, Urine: <1.005 — ABNORMAL LOW
Urobilinogen, UA: 0.2
pH: 5.5

## 2011-06-12 LAB — URINE MICROSCOPIC-ADD ON: Urine-Other: NONE SEEN

## 2011-06-12 LAB — MAGNESIUM: Magnesium: 1.9

## 2011-06-12 LAB — IRON AND TIBC
Iron: 40 — ABNORMAL LOW
TIBC: 272

## 2011-06-12 LAB — URINE CULTURE: Colony Count: 9000

## 2011-06-12 LAB — FERRITIN: Ferritin: 47 (ref 10–291)

## 2011-06-12 LAB — PHOSPHORUS: Phosphorus: 4.1

## 2011-06-19 ENCOUNTER — Ambulatory Visit (HOSPITAL_COMMUNITY): Payer: Medicare Other | Attending: Orthopedic Surgery

## 2011-06-19 ENCOUNTER — Telehealth: Payer: Self-pay | Admitting: Radiology

## 2011-06-19 NOTE — Telephone Encounter (Signed)
I called to give the patient her MRI appointment and cell phone was disconnected and home phone, no answer and no answering machine. Her MRI appointment is 06-19-11 at 10:00.

## 2011-07-01 ENCOUNTER — Ambulatory Visit (HOSPITAL_COMMUNITY)
Admission: RE | Admit: 2011-07-01 | Discharge: 2011-07-01 | Disposition: A | Discharge status: Home / Self Care | Payer: Medicare Other | Source: Ambulatory Visit | Attending: Orthopedic Surgery | Admitting: Orthopedic Surgery

## 2011-07-01 DIAGNOSIS — M25373 Other instability, unspecified ankle: Secondary | ICD-10-CM

## 2011-07-01 DIAGNOSIS — R937 Abnormal findings on diagnostic imaging of other parts of musculoskeletal system: Secondary | ICD-10-CM | POA: Insufficient documentation

## 2011-07-01 DIAGNOSIS — M25579 Pain in unspecified ankle and joints of unspecified foot: Secondary | ICD-10-CM

## 2011-07-11 ENCOUNTER — Ambulatory Visit (INDEPENDENT_AMBULATORY_CARE_PROVIDER_SITE_OTHER): Payer: Medicare Other | Admitting: Orthopedic Surgery

## 2011-07-11 ENCOUNTER — Encounter: Payer: Self-pay | Admitting: Orthopedic Surgery

## 2011-07-11 VITALS — BP 120/80 | Ht 64.0 in | Wt 141.6 lb

## 2011-07-11 DIAGNOSIS — M8700 Idiopathic aseptic necrosis of unspecified bone: Secondary | ICD-10-CM

## 2011-07-11 MED ORDER — HYDROCODONE-ACETAMINOPHEN 7.5-650 MG PO TABS: 1.0000 | ORAL_TABLET | Freq: Four times a day (QID) | ORAL | Status: DC | PRN

## 2011-07-11 NOTE — Patient Instructions (Signed)
BRACE OFF  CRUTCHES NO WEIGHT BEARING

## 2011-07-11 NOTE — Progress Notes (Signed)
Follow up visit after MRI, RIGHT ankle at the hospital, date, October 15  Patient having pain in the RIGHT ankle.  MRI shows bone infarcts distal tibia and calcaneus ligament structures and soft tissue structures are otherwise normal.  Recommend nonweightbearing with crutches for 6 weeks. Continue hydrocodone 7.5 mg

## 2011-08-27 ENCOUNTER — Encounter: Payer: Self-pay | Admitting: Orthopedic Surgery

## 2011-08-27 ENCOUNTER — Telehealth: Payer: Self-pay | Admitting: Orthopedic Surgery

## 2011-08-27 ENCOUNTER — Ambulatory Visit (INDEPENDENT_AMBULATORY_CARE_PROVIDER_SITE_OTHER): Payer: Medicare Other | Admitting: Orthopedic Surgery

## 2011-08-27 VITALS — BP 120/70 | Ht 64.0 in | Wt 140.0 lb

## 2011-08-27 DIAGNOSIS — M8700 Idiopathic aseptic necrosis of unspecified bone: Secondary | ICD-10-CM

## 2011-08-27 MED ORDER — HYDROCODONE-ACETAMINOPHEN 7.5-650 MG PO TABS: 1.0000 | ORAL_TABLET | Freq: Four times a day (QID) | ORAL | Status: DC | PRN

## 2011-08-27 NOTE — Progress Notes (Signed)
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 Bone marrow signal is otherwise unremarkable. There is  no osteochondral lesion of the talar dome.  The peroneal, Achilles, posteromedial and anterior tendons all  appear normal. Major ligamentous structures about the ankle are  intact. There is no focal fluid collection or mass. Plantar  fascia and sinus tarsi appear normal.  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 The  examination is otherwise negative.   The patient continues to complain of severe pain although the MRI does not show why.  Her pain is not relieved by hydrocodone 7.5 mg  I can find no reason for her to continue to have this kind of discomfort in her ankle.  There is no swelling she has normal range of motion at the ankle joint and the ankle joint is stable.  The patient does not need any surgery that I can see that would help  I think she needs further treatment than she should see a foot and ankle specialist and we are trying to arrange that at [facility].  No further appointments have been arranged here.  I did give her some pain medication to help with her discomfort.

## 2011-08-27 NOTE — Patient Instructions (Signed)
Schedule appointment to see foot ankle specialist at [facility] for bone infarcts ankle

## 2011-08-27 NOTE — Telephone Encounter (Signed)
Patient asked if still to continue using crutches?  She has no follow up appointment here; referral to [facility].  Her ph# [phone].

## 2011-08-28 ENCOUNTER — Telehealth: Payer: Self-pay | Admitting: Radiology

## 2011-08-28 NOTE — Telephone Encounter (Signed)
yes

## 2011-08-28 NOTE — Telephone Encounter (Signed)
Called patient to notify

## 2011-08-28 NOTE — Telephone Encounter (Signed)
I faxed a referral for this patient to [facility] to be seen for her ankle.

## 2011-09-15 NOTE — Telephone Encounter (Signed)
Yes

## 2011-09-18 ENCOUNTER — Telehealth: Payer: Self-pay | Admitting: Orthopedic Surgery

## 2011-09-18 NOTE — Telephone Encounter (Signed)
Patient called to ask about referral to [facility].  Advised referral request faxed on 08/28/11 and in progress.  Any further information received?  Her cell # is [phone] (Mobile)

## 2011-10-02 ENCOUNTER — Other Ambulatory Visit: Payer: Self-pay | Admitting: Orthopedic Surgery

## 2011-10-03 NOTE — Telephone Encounter (Signed)
10/03/11 - (1)patient called to relay that she was contacted by [facility] and has been scheduled for her referral appointment:  Dr. Deborra Medina, on 10/24/11, 9:00am                (2)patient is asking about Hydrocodone refill that she said was faxed to our office yesterday.

## 2011-10-03 NOTE — Telephone Encounter (Signed)
Declined

## 2011-10-07 NOTE — Telephone Encounter (Signed)
Patient was made aware last week that pain medicine would not be prescribed from this office

## 2011-10-24 DIAGNOSIS — R52 Pain, unspecified: Secondary | ICD-10-CM | POA: Insufficient documentation

## 2012-03-01 ENCOUNTER — Emergency Department (HOSPITAL_COMMUNITY): Payer: Medicare Other

## 2012-03-01 ENCOUNTER — Encounter (HOSPITAL_COMMUNITY): Payer: Self-pay | Admitting: Emergency Medicine

## 2012-03-01 ENCOUNTER — Inpatient Hospital Stay (HOSPITAL_COMMUNITY)
Admission: EM | Admit: 2012-03-01 | Discharge: 2012-03-02 | DRG: 313 | Disposition: A | Discharge status: Home / Self Care | Payer: Medicare Other | Attending: Internal Medicine | Admitting: Internal Medicine

## 2012-03-01 DIAGNOSIS — Z882 Allergy status to sulfonamides status: Secondary | ICD-10-CM

## 2012-03-01 DIAGNOSIS — F17200 Nicotine dependence, unspecified, uncomplicated: Secondary | ICD-10-CM | POA: Diagnosis present

## 2012-03-01 DIAGNOSIS — K219 Gastro-esophageal reflux disease without esophagitis: Secondary | ICD-10-CM | POA: Diagnosis present

## 2012-03-01 DIAGNOSIS — J441 Chronic obstructive pulmonary disease with (acute) exacerbation: Secondary | ICD-10-CM

## 2012-03-01 DIAGNOSIS — I10 Essential (primary) hypertension: Secondary | ICD-10-CM

## 2012-03-01 DIAGNOSIS — Z23 Encounter for immunization: Secondary | ICD-10-CM

## 2012-03-01 DIAGNOSIS — Z886 Allergy status to analgesic agent status: Secondary | ICD-10-CM

## 2012-03-01 DIAGNOSIS — R0789 Other chest pain: Principal | ICD-10-CM | POA: Diagnosis present

## 2012-03-01 DIAGNOSIS — F1210 Cannabis abuse, uncomplicated: Secondary | ICD-10-CM | POA: Diagnosis present

## 2012-03-01 DIAGNOSIS — E876 Hypokalemia: Secondary | ICD-10-CM | POA: Diagnosis present

## 2012-03-01 DIAGNOSIS — E119 Type 2 diabetes mellitus without complications: Secondary | ICD-10-CM | POA: Diagnosis present

## 2012-03-01 DIAGNOSIS — Z79899 Other long term (current) drug therapy: Secondary | ICD-10-CM

## 2012-03-01 DIAGNOSIS — Z720 Tobacco use: Secondary | ICD-10-CM | POA: Diagnosis present

## 2012-03-01 DIAGNOSIS — R079 Chest pain, unspecified: Secondary | ICD-10-CM

## 2012-03-01 DIAGNOSIS — J449 Chronic obstructive pulmonary disease, unspecified: Secondary | ICD-10-CM | POA: Diagnosis present

## 2012-03-01 HISTORY — DX: Other chronic pain: M25.569

## 2012-03-01 HISTORY — DX: Pain in unspecified ankle and joints of unspecified foot: M25.579

## 2012-03-01 HISTORY — DX: Other chronic pain: M54.9

## 2012-03-01 HISTORY — DX: Other chronic pain: G89.29

## 2012-03-01 LAB — DIFFERENTIAL
Basophils Relative: 1 % (ref 0–1)
Eosinophils Absolute: 0.2 10*3/uL (ref 0.0–0.7)
Lymphs Abs: 3.6 10*3/uL (ref 0.7–4.0)
Neutro Abs: 3.1 10*3/uL (ref 1.7–7.7)
Neutrophils Relative %: 40 % — ABNORMAL LOW (ref 43–77)

## 2012-03-01 LAB — CARDIAC PANEL(CRET KIN+CKTOT+MB+TROPI)
Relative Index: 1.5 (ref 0.0–2.5)
Total CK: 139 U/L (ref 7–177)

## 2012-03-01 LAB — BASIC METABOLIC PANEL
GFR calc Af Amer: >90 mL/min (ref >90)
GFR calc non Af Amer: >90 mL/min (ref >90)
Glucose, Bld: 109 mg/dL — ABNORMAL HIGH (ref 70–99)
Potassium: 3 mEq/L — ABNORMAL LOW (ref 3.5–5.1)
Sodium: 140 mEq/L (ref 135–145)

## 2012-03-01 LAB — RAPID URINE DRUG SCREEN, HOSP PERFORMED
Cocaine: NOT DETECTED
Opiates: POSITIVE — AB

## 2012-03-01 LAB — CBC
MCH: 30.9 pg (ref 26.0–34.0)
Platelets: 206 10*3/uL (ref 150–400)
RBC: 4.33 MIL/uL (ref 3.87–5.11)

## 2012-03-01 MED ORDER — ONDANSETRON HCL 4 MG PO TABS
4.0000 mg | ORAL_TABLET | Freq: Four times a day (QID) | ORAL | Status: DC | PRN
  Administered 2012-03-01: 4 mg via ORAL
  Filled 2012-03-01: qty 1

## 2012-03-01 MED ORDER — MORPHINE SULFATE 4 MG/ML IJ SOLN
4.0000 mg | INTRAMUSCULAR | Status: DC | PRN
  Administered 2012-03-01: 4 mg via INTRAVENOUS
  Filled 2012-03-01: qty 1

## 2012-03-01 MED ORDER — ESTRADIOL 1 MG PO TABS
1.0000 mg | ORAL_TABLET | Freq: Every day | ORAL | Status: DC
  Administered 2012-03-02: 1 mg via ORAL
  Filled 2012-03-01 (×3): qty 1

## 2012-03-01 MED ORDER — HYDROCHLOROTHIAZIDE 25 MG PO TABS
25.0000 mg | ORAL_TABLET | Freq: Every day | ORAL | Status: DC
  Filled 2012-03-01: qty 1

## 2012-03-01 MED ORDER — NITROGLYCERIN 0.4 MG SL SUBL
0.4000 mg | SUBLINGUAL_TABLET | SUBLINGUAL | Status: DC | PRN
  Administered 2012-03-01 (×2): 0.4 mg via SUBLINGUAL
  Filled 2012-03-01: qty 25

## 2012-03-01 MED ORDER — IRBESARTAN 150 MG PO TABS
150.0000 mg | ORAL_TABLET | Freq: Every day | ORAL | Status: DC
  Filled 2012-03-01: qty 1

## 2012-03-01 MED ORDER — DEXTROSE 5 % IV SOLN
1.0000 g | INTRAVENOUS | Status: DC
  Administered 2012-03-01: 1 g via INTRAVENOUS
  Filled 2012-03-01 (×2): qty 10

## 2012-03-01 MED ORDER — PNEUMOCOCCAL VAC POLYVALENT 25 MCG/0.5ML IJ INJ
0.5000 mL | INJECTION | INTRAMUSCULAR | Status: AC
  Administered 2012-03-02: 0.5 mL via INTRAMUSCULAR
  Filled 2012-03-01: qty 0.5

## 2012-03-01 MED ORDER — ALBUTEROL SULFATE (5 MG/ML) 0.5% IN NEBU: 2.5000 mg | INHALATION_SOLUTION | RESPIRATORY_TRACT | Status: DC | PRN

## 2012-03-01 MED ORDER — POTASSIUM CHLORIDE 10 MEQ/100ML IV SOLN
10.0000 meq | Freq: Once | INTRAVENOUS | Status: AC
  Administered 2012-03-01: 10 meq via INTRAVENOUS
  Filled 2012-03-01: qty 100

## 2012-03-01 MED ORDER — DEXTROSE 5 % IV SOLN
INTRAVENOUS | Status: AC
  Filled 2012-03-01: qty 10

## 2012-03-01 MED ORDER — ONDANSETRON HCL 4 MG/2ML IJ SOLN: 4.0000 mg | Freq: Four times a day (QID) | INTRAMUSCULAR | Status: DC | PRN

## 2012-03-01 MED ORDER — POTASSIUM CHLORIDE CRYS ER 20 MEQ PO TBCR
40.0000 meq | EXTENDED_RELEASE_TABLET | Freq: Once | ORAL | Status: AC
  Administered 2012-03-01: 40 meq via ORAL
  Filled 2012-03-01: qty 2

## 2012-03-01 MED ORDER — POTASSIUM CHLORIDE IN NACL 20-0.9 MEQ/L-% IV SOLN
INTRAVENOUS | Status: DC
  Administered 2012-03-01 (×2): via INTRAVENOUS

## 2012-03-01 MED ORDER — SODIUM CHLORIDE 0.9 % IJ SOLN
3.0000 mL | Freq: Two times a day (BID) | INTRAMUSCULAR | Status: DC
  Administered 2012-03-01: 3 mL via INTRAVENOUS
  Filled 2012-03-01: qty 3

## 2012-03-01 MED ORDER — HYDROCODONE-ACETAMINOPHEN 5-325 MG PO TABS
1.0000 | ORAL_TABLET | ORAL | Status: DC | PRN
  Administered 2012-03-01: 2 via ORAL
  Administered 2012-03-01 – 2012-03-02 (×3): 1 via ORAL
  Filled 2012-03-01 (×3): qty 1
  Filled 2012-03-01: qty 2

## 2012-03-01 MED ORDER — METHYLPREDNISOLONE SODIUM SUCC 125 MG IJ SOLR
125.0000 mg | Freq: Four times a day (QID) | INTRAMUSCULAR | Status: DC
  Administered 2012-03-01 – 2012-03-02 (×3): 125 mg via INTRAVENOUS
  Filled 2012-03-01 (×3): qty 2

## 2012-03-01 MED ORDER — IRBESARTAN 150 MG PO TABS
150.0000 mg | ORAL_TABLET | Freq: Every day | ORAL | Status: DC
  Administered 2012-03-01 – 2012-03-02 (×2): 150 mg via ORAL
  Filled 2012-03-01 (×2): qty 1

## 2012-03-01 MED ORDER — BENZONATATE 100 MG PO CAPS
200.0000 mg | ORAL_CAPSULE | Freq: Three times a day (TID) | ORAL | Status: DC | PRN
  Administered 2012-03-01 – 2012-03-02 (×2): 200 mg via ORAL
  Filled 2012-03-01 (×2): qty 2

## 2012-03-01 MED ORDER — ASPIRIN 81 MG PO CHEW
324.0000 mg | CHEWABLE_TABLET | Freq: Once | ORAL | Status: AC
  Administered 2012-03-01: 324 mg via ORAL
  Filled 2012-03-01: qty 4

## 2012-03-01 MED ORDER — ONDANSETRON HCL 4 MG/2ML IJ SOLN
4.0000 mg | Freq: Three times a day (TID) | INTRAMUSCULAR | Status: DC | PRN
  Administered 2012-03-01: 4 mg via INTRAVENOUS
  Filled 2012-03-01: qty 2

## 2012-03-01 MED ORDER — MONTELUKAST SODIUM 10 MG PO TABS
10.0000 mg | ORAL_TABLET | Freq: Every day | ORAL | Status: DC
  Administered 2012-03-02: 10 mg via ORAL
  Filled 2012-03-01: qty 1

## 2012-03-01 MED ORDER — ALBUTEROL SULFATE HFA 108 (90 BASE) MCG/ACT IN AERS
2.0000 | INHALATION_SPRAY | Freq: Four times a day (QID) | RESPIRATORY_TRACT | Status: DC | PRN
  Administered 2012-03-01 – 2012-03-02 (×2): 2 via RESPIRATORY_TRACT
  Filled 2012-03-01: qty 6.7

## 2012-03-01 MED ORDER — SERTRALINE HCL 50 MG PO TABS
50.0000 mg | ORAL_TABLET | Freq: Every day | ORAL | Status: DC
  Administered 2012-03-02: 50 mg via ORAL
  Filled 2012-03-01 (×2): qty 1

## 2012-03-01 MED ORDER — HYDROCHLOROTHIAZIDE 25 MG PO TABS
25.0000 mg | ORAL_TABLET | Freq: Every day | ORAL | Status: DC
  Administered 2012-03-01 – 2012-03-02 (×2): 25 mg via ORAL
  Filled 2012-03-01 (×2): qty 1

## 2012-03-01 MED ORDER — FLUTICASONE-SALMETEROL 115-21 MCG/ACT IN AERO
2.0000 | INHALATION_SPRAY | Freq: Two times a day (BID) | RESPIRATORY_TRACT | Status: DC
  Administered 2012-03-02: 2 via RESPIRATORY_TRACT
  Filled 2012-03-01: qty 8

## 2012-03-01 MED ORDER — GUAIFENESIN ER 600 MG PO TB12
1200.0000 mg | ORAL_TABLET | Freq: Two times a day (BID) | ORAL | Status: DC
  Administered 2012-03-01 – 2012-03-02 (×2): 1200 mg via ORAL
  Filled 2012-03-01 (×2): qty 2

## 2012-03-01 MED ORDER — SODIUM CHLORIDE 0.9 % IV SOLN: INTRAVENOUS | Status: DC

## 2012-03-01 MED ORDER — HEPARIN SODIUM (PORCINE) 5000 UNIT/ML IJ SOLN
5000.0000 [IU] | Freq: Three times a day (TID) | INTRAMUSCULAR | Status: DC
  Administered 2012-03-01 – 2012-03-02 (×2): 5000 [IU] via SUBCUTANEOUS
  Filled 2012-03-01 (×3): qty 1

## 2012-03-01 NOTE — ED Notes (Signed)
Patient with c/o intermittent, sharp chest pain for two days, worsening today. Patient reports pain radiates to neck. +N/V/D, +diaphoresis.

## 2012-03-01 NOTE — ED Notes (Signed)
SL nitro given, pt reporting minor relief following first dose.  Second dose given.
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
## 2012-03-01 NOTE — Progress Notes (Signed)
BP 183/101 paged Dr. Karilyn Cota. Dr. Karilyn Cota stated to give patient Hydrodiuril and Avapro now.
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
## 2012-03-01 NOTE — ED Notes (Signed)
Pt reports continued improvement in pain level. Rated 4/10.

## 2012-03-01 NOTE — ED Notes (Signed)
Pt reporting pain in left chest and shoulder.  Reports pain started 2 days ago.  Also reporting nausea and vomiting x4 days.  Pt on monitor, NS rhythm.  No distress noted.

## 2012-03-01 NOTE — Progress Notes (Signed)
Duplicate Zofran orders noted. One to expire at 0415. Notified MD on call and was told to use discretion based on patient needs. Will follow hospitalist's orders.

## 2012-03-02 DIAGNOSIS — F17200 Nicotine dependence, unspecified, uncomplicated: Secondary | ICD-10-CM

## 2012-03-02 DIAGNOSIS — I10 Essential (primary) hypertension: Secondary | ICD-10-CM

## 2012-03-02 DIAGNOSIS — R079 Chest pain, unspecified: Secondary | ICD-10-CM

## 2012-03-02 DIAGNOSIS — J441 Chronic obstructive pulmonary disease with (acute) exacerbation: Secondary | ICD-10-CM

## 2012-03-02 LAB — CBC
Hemoglobin: 12.9 g/dL (ref 12.0–15.0)
MCH: 30.8 pg (ref 26.0–34.0)
MCV: 90.7 fL (ref 78.0–100.0)
RBC: 4.19 MIL/uL (ref 3.87–5.11)

## 2012-03-02 LAB — CARDIAC PANEL(CRET KIN+CKTOT+MB+TROPI)
CK, MB: 2.3 ng/mL (ref 0.3–4.0)
Relative Index: 1.6 (ref 0.0–2.5)
Relative Index: 1.8 (ref 0.0–2.5)
Total CK: 130 U/L (ref 7–177)
Total CK: 148 U/L (ref 7–177)
Troponin I: <0.3 ng/mL (ref <0.30)

## 2012-03-02 LAB — COMPREHENSIVE METABOLIC PANEL
AST: 19 U/L (ref 0–37)
Albumin: 3.8 g/dL (ref 3.5–5.2)
Calcium: 9.6 mg/dL (ref 8.4–10.5)
Creatinine, Ser: 0.53 mg/dL (ref 0.50–1.10)

## 2012-03-02 MED ORDER — HYDROCODONE-ACETAMINOPHEN 5-325 MG PO TABS: 1.0000 | ORAL_TABLET | ORAL | Status: AC | PRN

## 2012-03-02 MED ORDER — BENZONATATE 200 MG PO CAPS: 200.0000 mg | ORAL_CAPSULE | Freq: Three times a day (TID) | ORAL | Status: AC | PRN

## 2012-03-02 MED ORDER — LEVOFLOXACIN 500 MG PO TABS: 500.0000 mg | ORAL_TABLET | Freq: Every day | ORAL | Status: AC

## 2012-03-02 MED ORDER — PREDNISONE 20 MG PO TABS: ORAL_TABLET | ORAL | Status: DC

## 2012-03-02 NOTE — Progress Notes (Signed)
03/02/12 1320 Patient discharged home this afternoon. Reviewed discharge instructions with patient this morning via teachback method, verbalized understanding. Given copy of instructions, med list, prescriptions, and f/u appointment information. IV site d/c'd per nurse tech, within normal limits. Pt left floor in stable condition. Riccardo Dubin

## 2012-03-02 NOTE — Discharge Summary (Signed)
Physician Discharge Summary  Patient ID: Sarah Ochoa MRN: [id number] DOB/AGE: 1961-04-17 51 y.o. Primary Care Physician:KNOWLTON,STEPHEN D, MD Admit date: 03/01/2012 Discharge date: 03/02/2012    Discharge Diagnoses:  1. Musculoskeletal chest pain secondary to intractable coughing. No evidence of myocardial ischemia or infarction. 2. COPD exacerbation. 3. Hypertension. 4. Tobacco abuse. 5. Marijuana abuse.   Medication List  As of 03/02/2012  7:37 AM   STOP taking these medications         budesonide-formoterol 160-4.5 MCG/ACT inhaler         TAKE these medications         albuterol 108 (90 BASE) MCG/ACT inhaler   Commonly known as: PROVENTIL HFA;VENTOLIN HFA   Inhale 2 puffs into the lungs every 6 (six) hours as needed. Shortness of breath      benzonatate 200 MG capsule   Commonly known as: TESSALON   Take 1 capsule (200 mg total) by mouth 3 (three) times daily as needed for cough.      DIOVAN 160 MG tablet   Generic drug: valsartan   Take 160 mg by mouth daily.      estradiol 1 MG tablet   Commonly known as: ESTRACE   Take 1 mg by mouth daily.      fluticasone-salmeterol 115-21 MCG/ACT inhaler   Commonly known as: ADVAIR HFA   Inhale 2 puffs into the lungs 2 (two) times daily.      hydrochlorothiazide 25 MG tablet   Commonly known as: HYDRODIURIL   Take 25 mg by mouth daily.      HYDROcodone-acetaminophen 5-325 MG per tablet   Commonly known as: NORCO   Take 1-2 tablets by mouth every 4 (four) hours as needed.      levofloxacin 500 MG tablet   Commonly known as: LEVAQUIN   Take 1 tablet (500 mg total) by mouth daily.      predniSONE 20 MG tablet   Commonly known as: DELTASONE   Take 2 tablets daily for 3 days, then 1 tablet daily for 3 days, then half tablet daily for 3 days, then STOP.      sertraline 50 MG tablet   Commonly known as: ZOLOFT   Take 50 mg by mouth daily.      SINGULAIR 10 MG tablet   Generic drug: montelukast   Take 10 mg by mouth  at bedtime.            Discharged Condition: Stable and improved.    Consults: None.  Significant Diagnostic Studies: Dg Chest Portable 1 View  03/01/2012  *RADIOLOGY REPORT*  Clinical Data: Chest pain  PORTABLE CHEST - 1 VIEW  Comparison: 05/16/2010  Findings: Heart size is upper limits of normal, likely related to portable AP technique.  The lungs are grossly clear.  Minimal bilateral lower lobe scarring again noted.  No pleural effusion. No acute osseous finding.  IMPRESSION: No focal acute abnormality.  Original Report Authenticated By: Harrel Lemon, M.D.    Lab Results: Basic Metabolic Panel:  [facility] 03/01/12 1232  NA 140  K 3.0*  CL 103  CO2 23  GLUCOSE 109*  BUN 12  CREATININE 0.68  CALCIUM 9.4  MG --  PHOS --       CBC:  Basename 03/01/12 1232  WBC 7.7  NEUTROABS 3.1  HGB 13.4  HCT 39.1  MCV 90.3  PLT 206       Hospital Course: This 51 year old lady, who has risk factors for coronary artery disease including  hypertension, smoking presented to the hospital with chest pain. Check she described the chest pain as a sharp pain and was associated with coughing. She had been coughing for the last 2-3 days associated with purulent sputum and a fever. She also had some wheezing. On admission she in fact did have some wheezing and was clearly tender in the anterior chest wall, reproducing her pain. She was admitted to telemetry and serial cardiac enzymes twice a day negative. Electrocardiogram twice did not show any ST-T wave changes. She has improved overnight with use of analgesia, steroids and antibiotics. She is stable to be discharged.  Discharge Exam: Blood pressure 143/77, pulse 45, temperature 97.4 F (36.3 C), temperature source Oral, resp. rate 20, height 5\' 5"  (1.651 m), weight 61.598 kg (135 lb 12.8 oz), SpO2 97.00%. She looks systemically well. Lung fields are entirely clear at this time. Heart sounds are present and normal without gallop  rhythm. Chest wall shows anterior chest wall tenderness reproducing her pain but less so than yesterday. She is alert and orientated without any focal neurological signs.  Disposition: Home. She will need a tapering course of prednisone as well as a one-week course of Levaquin. She will follow with her primary care physician.  Discharge Orders    Future Orders Please Complete By Expires   Diet - low sodium heart healthy      Increase activity slowly           SignedWilson Singer Pager [phone]  03/02/2012, 7:37 AM

## 2012-09-26 ENCOUNTER — Encounter (HOSPITAL_COMMUNITY): Payer: Self-pay | Admitting: Emergency Medicine

## 2012-09-26 ENCOUNTER — Emergency Department (HOSPITAL_COMMUNITY): Payer: Medicare Other

## 2012-09-26 ENCOUNTER — Emergency Department (HOSPITAL_COMMUNITY)
Admission: EM | Admit: 2012-09-26 | Discharge: 2012-09-26 | Disposition: A | Discharge status: Home / Self Care | Payer: Medicare Other | Attending: Emergency Medicine | Admitting: Emergency Medicine

## 2012-09-26 DIAGNOSIS — F17200 Nicotine dependence, unspecified, uncomplicated: Secondary | ICD-10-CM | POA: Insufficient documentation

## 2012-09-26 DIAGNOSIS — E119 Type 2 diabetes mellitus without complications: Secondary | ICD-10-CM | POA: Insufficient documentation

## 2012-09-26 DIAGNOSIS — J449 Chronic obstructive pulmonary disease, unspecified: Secondary | ICD-10-CM | POA: Insufficient documentation

## 2012-09-26 DIAGNOSIS — J4489 Other specified chronic obstructive pulmonary disease: Secondary | ICD-10-CM | POA: Insufficient documentation

## 2012-09-26 DIAGNOSIS — G8929 Other chronic pain: Secondary | ICD-10-CM | POA: Insufficient documentation

## 2012-09-26 DIAGNOSIS — R509 Fever, unspecified: Secondary | ICD-10-CM | POA: Insufficient documentation

## 2012-09-26 DIAGNOSIS — J45909 Unspecified asthma, uncomplicated: Secondary | ICD-10-CM | POA: Insufficient documentation

## 2012-09-26 DIAGNOSIS — Z79899 Other long term (current) drug therapy: Secondary | ICD-10-CM | POA: Insufficient documentation

## 2012-09-26 DIAGNOSIS — I10 Essential (primary) hypertension: Secondary | ICD-10-CM | POA: Insufficient documentation

## 2012-09-26 DIAGNOSIS — J029 Acute pharyngitis, unspecified: Secondary | ICD-10-CM | POA: Insufficient documentation

## 2012-09-26 DIAGNOSIS — R079 Chest pain, unspecified: Secondary | ICD-10-CM | POA: Insufficient documentation

## 2012-09-26 LAB — CBC WITH DIFFERENTIAL/PLATELET
Basophils Absolute: 0 10*3/uL (ref 0.0–0.1)
Basophils Relative: 0 % (ref 0–1)
Eosinophils Absolute: 0.1 10*3/uL (ref 0.0–0.7)
Eosinophils Relative: 0 % (ref 0–5)
HCT: 37.9 % (ref 36.0–46.0)
MCH: 31.6 pg (ref 26.0–34.0)
MCHC: 34.6 g/dL (ref 30.0–36.0)
MCV: 91.5 fL (ref 78.0–100.0)
Monocytes Absolute: 0.2 10*3/uL (ref 0.1–1.0)
Platelets: 199 10*3/uL (ref 150–400)
RDW: 14.4 % (ref 11.5–15.5)

## 2012-09-26 LAB — TROPONIN I: Troponin I: <0.3 ng/mL (ref <0.30)

## 2012-09-26 LAB — BASIC METABOLIC PANEL
BUN: 16 mg/dL (ref 6–23)
CO2: 26 mEq/L (ref 19–32)
Calcium: 9 mg/dL (ref 8.4–10.5)
Chloride: 102 mEq/L (ref 96–112)
Creatinine, Ser: 0.66 mg/dL (ref 0.50–1.10)

## 2012-09-26 MED ORDER — LORAZEPAM 1 MG PO TABS
1.0000 mg | ORAL_TABLET | Freq: Once | ORAL | Status: AC
  Administered 2012-09-26: 1 mg via ORAL
  Filled 2012-09-26: qty 1

## 2012-09-26 MED ORDER — ALBUTEROL SULFATE (5 MG/ML) 0.5% IN NEBU
INHALATION_SOLUTION | RESPIRATORY_TRACT | Status: AC
  Filled 2012-09-26: qty 0.5

## 2012-09-26 MED ORDER — AZITHROMYCIN 250 MG PO TABS: ORAL_TABLET | ORAL | Status: DC

## 2012-09-26 MED ORDER — METHYLPREDNISOLONE SODIUM SUCC 125 MG IJ SOLR
INTRAMUSCULAR | Status: AC
  Filled 2012-09-26: qty 2

## 2012-09-26 MED ORDER — IPRATROPIUM BROMIDE 0.02 % IN SOLN
RESPIRATORY_TRACT | Status: AC
  Filled 2012-09-26: qty 2.5

## 2012-09-26 MED ORDER — HYDROCOD POLST-CHLORPHEN POLST 10-8 MG/5ML PO LQCR: 5.0000 mL | Freq: Two times a day (BID) | ORAL | Status: DC

## 2012-09-26 MED ORDER — ONDANSETRON 8 MG PO TBDP
ORAL_TABLET | ORAL | Status: AC
  Administered 2012-09-26: 8 mg
  Filled 2012-09-26: qty 1

## 2012-09-26 MED ORDER — IBUPROFEN 800 MG PO TABS
800.0000 mg | ORAL_TABLET | Freq: Once | ORAL | Status: AC
  Administered 2012-09-26: 800 mg via ORAL
  Filled 2012-09-26: qty 1

## 2012-09-26 NOTE — ED Notes (Signed)
Family at bedside. 

## 2012-09-26 NOTE — ED Notes (Signed)
Patient called out wanting some medicine for her headache RN made aware.
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
## 2012-09-26 NOTE — ED Notes (Signed)
Pt reports cough and "general illness" for 3 days, woke up this morning with worsened shortness of breath. Did not use her home nebulizer due to anxiety, called 911. Pt VSS. Left A/C PIV #18, solumedrol 125 IVP and Albuterol Atrovent neb given by EMS. Pt denies chest pain. Reports sore throat.

## 2012-09-27 NOTE — ED Provider Notes (Signed)
Medical screening examination/treatment/procedure(s) were conducted as a shared visit with non-physician practitioner(s) and myself.  I personally evaluated the patient during the encounter.  No resp distress.  Good color.  Pulse ox 98%  Donnetta Hutching, MD 09/27/12 [phone]

## 2012-11-29 ENCOUNTER — Emergency Department (HOSPITAL_COMMUNITY): Payer: Medicare Other

## 2012-11-29 ENCOUNTER — Emergency Department (HOSPITAL_COMMUNITY)
Admission: EM | Admit: 2012-11-29 | Discharge: 2012-11-29 | Disposition: A | Discharge status: Home / Self Care | Payer: Medicare Other | Attending: Emergency Medicine | Admitting: Emergency Medicine

## 2012-11-29 ENCOUNTER — Encounter (HOSPITAL_COMMUNITY): Payer: Self-pay

## 2012-11-29 DIAGNOSIS — J4489 Other specified chronic obstructive pulmonary disease: Secondary | ICD-10-CM | POA: Insufficient documentation

## 2012-11-29 DIAGNOSIS — Z8739 Personal history of other diseases of the musculoskeletal system and connective tissue: Secondary | ICD-10-CM | POA: Insufficient documentation

## 2012-11-29 DIAGNOSIS — F17200 Nicotine dependence, unspecified, uncomplicated: Secondary | ICD-10-CM | POA: Insufficient documentation

## 2012-11-29 DIAGNOSIS — J449 Chronic obstructive pulmonary disease, unspecified: Secondary | ICD-10-CM | POA: Insufficient documentation

## 2012-11-29 DIAGNOSIS — Z79899 Other long term (current) drug therapy: Secondary | ICD-10-CM | POA: Insufficient documentation

## 2012-11-29 DIAGNOSIS — R05 Cough: Secondary | ICD-10-CM | POA: Insufficient documentation

## 2012-11-29 DIAGNOSIS — K219 Gastro-esophageal reflux disease without esophagitis: Secondary | ICD-10-CM | POA: Insufficient documentation

## 2012-11-29 DIAGNOSIS — E119 Type 2 diabetes mellitus without complications: Secondary | ICD-10-CM | POA: Insufficient documentation

## 2012-11-29 DIAGNOSIS — R059 Cough, unspecified: Secondary | ICD-10-CM | POA: Insufficient documentation

## 2012-11-29 DIAGNOSIS — I10 Essential (primary) hypertension: Secondary | ICD-10-CM | POA: Insufficient documentation

## 2012-11-29 MED ORDER — ALBUTEROL SULFATE (5 MG/ML) 0.5% IN NEBU
5.0000 mg | INHALATION_SOLUTION | Freq: Once | RESPIRATORY_TRACT | Status: AC
  Administered 2012-11-29: 5 mg via RESPIRATORY_TRACT
  Filled 2012-11-29: qty 0.5

## 2012-11-29 MED ORDER — LIDOCAINE HCL (PF) 2 % IJ SOLN
5.0000 mL | Freq: Once | INTRAMUSCULAR | Status: AC
  Administered 2012-11-29: 5 mL
  Filled 2012-11-29: qty 10

## 2012-11-29 MED ORDER — LIDOCAINE HCL 2 % IJ SOLN: INTRAMUSCULAR | Status: DC

## 2012-11-29 MED ORDER — HYDROCOD POLST-CHLORPHEN POLST 10-8 MG/5ML PO LQCR
ORAL | Status: AC
Start: 2012-11-29 — End: 2012-11-29
  Administered 2012-11-29: 5 mL via ORAL
  Filled 2012-11-29: qty 5

## 2012-11-29 MED ORDER — BENZONATATE 100 MG PO CAPS
200.0000 mg | ORAL_CAPSULE | Freq: Once | ORAL | Status: AC
  Administered 2012-11-29: 200 mg via ORAL
  Filled 2012-11-29: qty 2

## 2012-11-29 MED ORDER — BENZONATATE 100 MG PO CAPS: 100.0000 mg | ORAL_CAPSULE | Freq: Three times a day (TID) | ORAL | Status: DC

## 2012-11-29 MED ORDER — LIDOCAINE HCL 4 % IJ SOLN: 5.0000 mL | Freq: Once | INTRAMUSCULAR | Status: DC

## 2012-11-29 MED ORDER — HYDROCOD POLST-CHLORPHEN POLST 10-8 MG/5ML PO LQCR: 5.0000 mL | Freq: Two times a day (BID) | ORAL | Status: DC

## 2012-11-29 MED ORDER — HYDROCOD POLST-CHLORPHEN POLST 10-8 MG/5ML PO LQCR
5.0000 mL | Freq: Once | ORAL | Status: AC
  Administered 2012-11-29: 5 mL via ORAL

## 2012-11-29 NOTE — ED Notes (Signed)
Pt presents to ED with c/o cough and fever. Patient says she has been coughing for three weeks. Patient says she has been coughing up green sputum and says her chest is hurting when she coughs. Patient says she has had a fever and feels hot but has not taken her temperature at home.
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
## 2012-12-04 NOTE — ED Provider Notes (Signed)
Medical screening examination/treatment/procedure(s) were performed by non-physician practitioner and as supervising physician I was immediately available for consultation/collaboration.  Neha Waight, MD 12/04/12 0041 

## 2012-12-20 ENCOUNTER — Emergency Department (HOSPITAL_COMMUNITY): Payer: Medicare Other

## 2012-12-20 ENCOUNTER — Emergency Department (HOSPITAL_COMMUNITY)
Admission: EM | Admit: 2012-12-20 | Discharge: 2012-12-20 | Disposition: A | Discharge status: Home / Self Care | Payer: Medicare Other | Attending: Emergency Medicine | Admitting: Emergency Medicine

## 2012-12-20 ENCOUNTER — Encounter (HOSPITAL_COMMUNITY): Payer: Self-pay

## 2012-12-20 DIAGNOSIS — F17200 Nicotine dependence, unspecified, uncomplicated: Secondary | ICD-10-CM | POA: Insufficient documentation

## 2012-12-20 DIAGNOSIS — R109 Unspecified abdominal pain: Secondary | ICD-10-CM | POA: Insufficient documentation

## 2012-12-20 DIAGNOSIS — G8929 Other chronic pain: Secondary | ICD-10-CM | POA: Insufficient documentation

## 2012-12-20 DIAGNOSIS — Z79899 Other long term (current) drug therapy: Secondary | ICD-10-CM | POA: Insufficient documentation

## 2012-12-20 DIAGNOSIS — A599 Trichomoniasis, unspecified: Secondary | ICD-10-CM | POA: Insufficient documentation

## 2012-12-20 DIAGNOSIS — Z8739 Personal history of other diseases of the musculoskeletal system and connective tissue: Secondary | ICD-10-CM | POA: Insufficient documentation

## 2012-12-20 DIAGNOSIS — J4489 Other specified chronic obstructive pulmonary disease: Secondary | ICD-10-CM | POA: Insufficient documentation

## 2012-12-20 DIAGNOSIS — R197 Diarrhea, unspecified: Secondary | ICD-10-CM | POA: Insufficient documentation

## 2012-12-20 DIAGNOSIS — I10 Essential (primary) hypertension: Secondary | ICD-10-CM | POA: Insufficient documentation

## 2012-12-20 DIAGNOSIS — K219 Gastro-esophageal reflux disease without esophagitis: Secondary | ICD-10-CM | POA: Insufficient documentation

## 2012-12-20 DIAGNOSIS — E876 Hypokalemia: Secondary | ICD-10-CM | POA: Insufficient documentation

## 2012-12-20 DIAGNOSIS — J449 Chronic obstructive pulmonary disease, unspecified: Secondary | ICD-10-CM | POA: Insufficient documentation

## 2012-12-20 LAB — URINALYSIS, ROUTINE W REFLEX MICROSCOPIC
Bilirubin Urine: NEGATIVE
Ketones, ur: NEGATIVE mg/dL
Nitrite: NEGATIVE
Protein, ur: NEGATIVE mg/dL
pH: 6 (ref 5.0–8.0)

## 2012-12-20 LAB — COMPREHENSIVE METABOLIC PANEL
ALT: 9 U/L (ref 0–35)
AST: 18 U/L (ref 0–37)
Albumin: 3.5 g/dL (ref 3.5–5.2)
Calcium: 8.9 mg/dL (ref 8.4–10.5)
Chloride: 104 mEq/L (ref 96–112)
Creatinine, Ser: 0.66 mg/dL (ref 0.50–1.10)
GFR calc non Af Amer: >90 mL/min (ref >90)
Potassium: 3 mEq/L — ABNORMAL LOW (ref 3.5–5.1)
Total Protein: 6.8 g/dL (ref 6.0–8.3)

## 2012-12-20 LAB — CBC WITH DIFFERENTIAL/PLATELET
Eosinophils Absolute: 0.4 10*3/uL (ref 0.0–0.7)
HCT: 33.3 % — ABNORMAL LOW (ref 36.0–46.0)
Hemoglobin: 11.4 g/dL — ABNORMAL LOW (ref 12.0–15.0)
Lymphs Abs: 4.3 10*3/uL — ABNORMAL HIGH (ref 0.7–4.0)
MCH: 30.7 pg (ref 26.0–34.0)
Monocytes Absolute: 0.8 10*3/uL (ref 0.1–1.0)
Monocytes Relative: 7 % (ref 3–12)
Neutro Abs: 5.7 10*3/uL (ref 1.7–7.7)
Neutrophils Relative %: 51 % (ref 43–77)
RBC: 3.71 MIL/uL — ABNORMAL LOW (ref 3.87–5.11)

## 2012-12-20 LAB — URINE MICROSCOPIC-ADD ON

## 2012-12-20 MED ORDER — IOHEXOL 300 MG/ML  SOLN
100.0000 mL | Freq: Once | INTRAMUSCULAR | Status: AC | PRN
  Administered 2012-12-20: 100 mL via INTRAVENOUS

## 2012-12-20 MED ORDER — METRONIDAZOLE 500 MG PO TABS: 500.0000 mg | ORAL_TABLET | Freq: Two times a day (BID) | ORAL | Status: DC

## 2012-12-20 MED ORDER — IOHEXOL 300 MG/ML  SOLN
50.0000 mL | Freq: Once | INTRAMUSCULAR | Status: AC | PRN
  Administered 2012-12-20: 50 mL via ORAL

## 2012-12-20 MED ORDER — POTASSIUM CHLORIDE ER 10 MEQ PO TBCR: 10.0000 meq | EXTENDED_RELEASE_TABLET | Freq: Two times a day (BID) | ORAL | Status: DC
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
## 2012-12-20 NOTE — ED Notes (Signed)
Pt c/o lower abd pain and lower back pain x 1 week.  Reports has the urge to urinate but only a few drips come out.  Denies pain with urination.  C/O vaginal soreness.  Denies any abnormal vaginal bleeding or discharge.  Also reports pulled a tick off of her lower back Friday.

## 2012-12-20 NOTE — ED Provider Notes (Signed)
Medical screening examination/treatment/procedure(s) were performed by non-physician practitioner and as supervising physician I was immediately available for consultation/collaboration.   Dione Booze, MD 12/20/12 [phone]

## 2012-12-20 NOTE — ED Notes (Signed)
Pt c/o lower abdominal and lower back pain x1 week. Pt states "I've been urinating more often but I don't empty my bladder". Pt also c/o "pain when wiping". Pt denies NVD and denies dysuria.

## 2013-02-28 ENCOUNTER — Encounter (HOSPITAL_COMMUNITY): Payer: Self-pay | Admitting: *Deleted

## 2013-02-28 ENCOUNTER — Emergency Department (HOSPITAL_COMMUNITY): Payer: Medicare Other

## 2013-02-28 ENCOUNTER — Emergency Department (HOSPITAL_COMMUNITY)
Admission: EM | Admit: 2013-02-28 | Discharge: 2013-02-28 | Disposition: A | Discharge status: Home / Self Care | Payer: Medicare Other | Attending: Emergency Medicine | Admitting: Emergency Medicine

## 2013-02-28 DIAGNOSIS — R0789 Other chest pain: Secondary | ICD-10-CM | POA: Insufficient documentation

## 2013-02-28 DIAGNOSIS — F10231 Alcohol dependence with withdrawal delirium: Secondary | ICD-10-CM | POA: Insufficient documentation

## 2013-02-28 DIAGNOSIS — G8929 Other chronic pain: Secondary | ICD-10-CM | POA: Insufficient documentation

## 2013-02-28 DIAGNOSIS — F1210 Cannabis abuse, uncomplicated: Secondary | ICD-10-CM

## 2013-02-28 DIAGNOSIS — F411 Generalized anxiety disorder: Secondary | ICD-10-CM | POA: Insufficient documentation

## 2013-02-28 DIAGNOSIS — K219 Gastro-esophageal reflux disease without esophagitis: Secondary | ICD-10-CM | POA: Insufficient documentation

## 2013-02-28 DIAGNOSIS — F1010 Alcohol abuse, uncomplicated: Secondary | ICD-10-CM | POA: Insufficient documentation

## 2013-02-28 DIAGNOSIS — Z8739 Personal history of other diseases of the musculoskeletal system and connective tissue: Secondary | ICD-10-CM | POA: Insufficient documentation

## 2013-02-28 DIAGNOSIS — J449 Chronic obstructive pulmonary disease, unspecified: Secondary | ICD-10-CM | POA: Insufficient documentation

## 2013-02-28 DIAGNOSIS — J4489 Other specified chronic obstructive pulmonary disease: Secondary | ICD-10-CM | POA: Insufficient documentation

## 2013-02-28 DIAGNOSIS — I10 Essential (primary) hypertension: Secondary | ICD-10-CM | POA: Insufficient documentation

## 2013-02-28 DIAGNOSIS — F17200 Nicotine dependence, unspecified, uncomplicated: Secondary | ICD-10-CM | POA: Insufficient documentation

## 2013-02-28 DIAGNOSIS — Z79899 Other long term (current) drug therapy: Secondary | ICD-10-CM | POA: Insufficient documentation

## 2013-02-28 DIAGNOSIS — F419 Anxiety disorder, unspecified: Secondary | ICD-10-CM

## 2013-02-28 DIAGNOSIS — F10121 Alcohol abuse with intoxication delirium: Secondary | ICD-10-CM

## 2013-02-28 DIAGNOSIS — F10931 Alcohol use, unspecified with withdrawal delirium: Secondary | ICD-10-CM | POA: Insufficient documentation

## 2013-02-28 DIAGNOSIS — IMO0002 Reserved for concepts with insufficient information to code with codable children: Secondary | ICD-10-CM | POA: Insufficient documentation

## 2013-02-28 HISTORY — DX: Depression, unspecified: F32.A

## 2013-02-28 HISTORY — DX: Depression, unspecified: F41.9

## 2013-02-28 HISTORY — DX: Major depressive disorder, single episode, unspecified: F32.9

## 2013-02-28 LAB — CBC WITH DIFFERENTIAL/PLATELET
Basophils Absolute: 0.1 10*3/uL (ref 0.0–0.1)
Basophils Relative: 1 % (ref 0–1)
Eosinophils Absolute: 0.2 10*3/uL (ref 0.0–0.7)
Hemoglobin: 12.4 g/dL (ref 12.0–15.0)
MCH: 31.3 pg (ref 26.0–34.0)
MCHC: 34.4 g/dL (ref 30.0–36.0)
Monocytes Relative: 6 % (ref 3–12)
Neutrophils Relative %: 44 % (ref 43–77)
RDW: 14.4 % (ref 11.5–15.5)

## 2013-02-28 LAB — RAPID URINE DRUG SCREEN, HOSP PERFORMED
Amphetamines: NOT DETECTED
Tetrahydrocannabinol: POSITIVE — AB

## 2013-02-28 LAB — BASIC METABOLIC PANEL
BUN: 14 mg/dL (ref 6–23)
Calcium: 9.3 mg/dL (ref 8.4–10.5)
Creatinine, Ser: 0.61 mg/dL (ref 0.50–1.10)
GFR calc Af Amer: >90 mL/min (ref >90)
GFR calc non Af Amer: >90 mL/min (ref >90)
Glucose, Bld: 96 mg/dL (ref 70–99)
Potassium: 3.1 mEq/L — ABNORMAL LOW (ref 3.5–5.1)

## 2013-02-28 LAB — ETHANOL: Alcohol, Ethyl (B): 101 mg/dL — ABNORMAL HIGH (ref 0–11)

## 2013-02-28 MED ORDER — POTASSIUM CHLORIDE 20 MEQ/15ML (10%) PO LIQD
40.0000 meq | Freq: Once | ORAL | Status: AC
  Administered 2013-02-28: 40 meq via ORAL
  Filled 2013-02-28: qty 30

## 2013-02-28 MED ORDER — ONDANSETRON HCL 4 MG/2ML IJ SOLN: 4.0000 mg | INTRAMUSCULAR | Status: DC | PRN

## 2013-02-28 MED ORDER — LORAZEPAM 1 MG PO TABS
1.0000 mg | ORAL_TABLET | Freq: Once | ORAL | Status: DC
  Filled 2013-02-28: qty 1

## 2013-02-28 MED ORDER — LORAZEPAM 2 MG/ML IJ SOLN
INTRAMUSCULAR | Status: AC
  Administered 2013-02-28: 1 mg via INTRAVENOUS
  Filled 2013-02-28: qty 1

## 2013-02-28 MED ORDER — LORAZEPAM 0.5 MG PO TABS: ORAL_TABLET | ORAL | Status: DC

## 2013-02-28 MED ORDER — ONDANSETRON HCL 4 MG/2ML IJ SOLN
INTRAMUSCULAR | Status: AC
  Administered 2013-02-28: 4 mg via INTRAVENOUS
  Filled 2013-02-28: qty 2

## 2013-02-28 MED ORDER — LORAZEPAM 2 MG/ML IJ SOLN: 1.0000 mg | Freq: Once | INTRAMUSCULAR | Status: AC

## 2013-02-28 MED ORDER — SODIUM CHLORIDE 0.9 % IV SOLN
INTRAVENOUS | Status: DC
  Administered 2013-02-28: 22:00:00 via INTRAVENOUS

## 2013-02-28 NOTE — ED Notes (Signed)
Pt vomiting. Dr. Clarene Duke gave orders  For IV meds.

## 2013-02-28 NOTE — ED Notes (Signed)
Pt finished all of potassium and coke.

## 2013-02-28 NOTE — ED Notes (Signed)
Mixed potassium with coke. Pt doesn't like the taste. I explained she needed to drink all of it.
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
## 2013-02-28 NOTE — ED Notes (Addendum)
Pt has been under a lot of stress lately. Pt c/o chest pain and anxiety. Pt was given 324 mg of aspirin PTA. Pt states "I just don't feel good."

## 2013-03-31 ENCOUNTER — Encounter (INDEPENDENT_AMBULATORY_CARE_PROVIDER_SITE_OTHER): Payer: Self-pay | Admitting: *Deleted

## 2013-04-28 ENCOUNTER — Emergency Department (HOSPITAL_COMMUNITY)
Admission: EM | Admit: 2013-04-28 | Discharge: 2013-04-28 | Disposition: A | Discharge status: Home / Self Care | Payer: Medicare Other | Attending: Emergency Medicine | Admitting: Emergency Medicine

## 2013-04-28 ENCOUNTER — Encounter (HOSPITAL_COMMUNITY): Payer: Self-pay

## 2013-04-28 ENCOUNTER — Emergency Department (HOSPITAL_COMMUNITY): Payer: Medicare Other

## 2013-04-28 DIAGNOSIS — F17200 Nicotine dependence, unspecified, uncomplicated: Secondary | ICD-10-CM | POA: Insufficient documentation

## 2013-04-28 DIAGNOSIS — J449 Chronic obstructive pulmonary disease, unspecified: Secondary | ICD-10-CM | POA: Insufficient documentation

## 2013-04-28 DIAGNOSIS — G8929 Other chronic pain: Secondary | ICD-10-CM | POA: Insufficient documentation

## 2013-04-28 DIAGNOSIS — Z792 Long term (current) use of antibiotics: Secondary | ICD-10-CM | POA: Insufficient documentation

## 2013-04-28 DIAGNOSIS — K6289 Other specified diseases of anus and rectum: Secondary | ICD-10-CM | POA: Insufficient documentation

## 2013-04-28 DIAGNOSIS — I10 Essential (primary) hypertension: Secondary | ICD-10-CM | POA: Insufficient documentation

## 2013-04-28 DIAGNOSIS — J4489 Other specified chronic obstructive pulmonary disease: Secondary | ICD-10-CM | POA: Insufficient documentation

## 2013-04-28 DIAGNOSIS — R197 Diarrhea, unspecified: Secondary | ICD-10-CM | POA: Insufficient documentation

## 2013-04-28 DIAGNOSIS — F341 Dysthymic disorder: Secondary | ICD-10-CM | POA: Insufficient documentation

## 2013-04-28 DIAGNOSIS — K219 Gastro-esophageal reflux disease without esophagitis: Secondary | ICD-10-CM | POA: Insufficient documentation

## 2013-04-28 DIAGNOSIS — R109 Unspecified abdominal pain: Secondary | ICD-10-CM | POA: Insufficient documentation

## 2013-04-28 DIAGNOSIS — IMO0002 Reserved for concepts with insufficient information to code with codable children: Secondary | ICD-10-CM | POA: Insufficient documentation

## 2013-04-28 LAB — CBC WITH DIFFERENTIAL/PLATELET
Basophils Absolute: 0 10*3/uL (ref 0.0–0.1)
Lymphocytes Relative: 44 % (ref 12–46)
Neutro Abs: 4.4 10*3/uL (ref 1.7–7.7)
Platelets: 198 10*3/uL (ref 150–400)
RDW: 14.7 % (ref 11.5–15.5)
WBC: 9.6 10*3/uL (ref 4.0–10.5)

## 2013-04-28 LAB — CLOSTRIDIUM DIFFICILE BY PCR: Toxigenic C. Difficile by PCR: NEGATIVE

## 2013-04-28 LAB — COMPREHENSIVE METABOLIC PANEL
ALT: 12 U/L (ref 0–35)
AST: 24 U/L (ref 0–37)
CO2: 22 mEq/L (ref 19–32)
Calcium: 9.4 mg/dL (ref 8.4–10.5)
Chloride: 107 mEq/L (ref 96–112)
GFR calc non Af Amer: >90 mL/min (ref >90)
Potassium: 3.3 mEq/L — ABNORMAL LOW (ref 3.5–5.1)
Sodium: 142 mEq/L (ref 135–145)
Total Bilirubin: 0.5 mg/dL (ref 0.3–1.2)

## 2013-04-28 LAB — URINALYSIS, ROUTINE W REFLEX MICROSCOPIC
Bilirubin Urine: NEGATIVE
Glucose, UA: NEGATIVE mg/dL
Ketones, ur: NEGATIVE mg/dL
Nitrite: NEGATIVE
Specific Gravity, Urine: >1.03 — ABNORMAL HIGH (ref 1.005–1.030)
pH: 6 (ref 5.0–8.0)

## 2013-04-28 LAB — URINE MICROSCOPIC-ADD ON

## 2013-04-28 MED ORDER — DICYCLOMINE HCL 20 MG PO TABS: 20.0000 mg | ORAL_TABLET | Freq: Four times a day (QID) | ORAL | Status: DC | PRN

## 2013-04-28 MED ORDER — CIPROFLOXACIN HCL 500 MG PO TABS: 500.0000 mg | ORAL_TABLET | Freq: Two times a day (BID) | ORAL | Status: DC

## 2013-04-28 MED ORDER — OXYCODONE-ACETAMINOPHEN 5-325 MG PO TABS: 2.0000 | ORAL_TABLET | Freq: Once | ORAL | Status: AC

## 2013-04-28 MED ORDER — OXYCODONE-ACETAMINOPHEN 5-325 MG PO TABS: ORAL_TABLET | ORAL | Status: DC

## 2013-04-28 MED ORDER — ONDANSETRON HCL 4 MG/2ML IJ SOLN
4.0000 mg | INTRAMUSCULAR | Status: DC | PRN
  Administered 2013-04-28: 4 mg via INTRAVENOUS
  Filled 2013-04-28: qty 2

## 2013-04-28 MED ORDER — FENTANYL CITRATE 0.05 MG/ML IJ SOLN
50.0000 ug | INTRAMUSCULAR | Status: AC | PRN
  Administered 2013-04-28 (×2): 50 ug via INTRAVENOUS
  Filled 2013-04-28 (×2): qty 2

## 2013-04-28 MED ORDER — OXYCODONE-ACETAMINOPHEN 5-325 MG PO TABS
ORAL_TABLET | ORAL | Status: AC
  Administered 2013-04-28: 2 via ORAL
  Filled 2013-04-28: qty 2

## 2013-04-28 MED ORDER — SODIUM CHLORIDE 0.9 % IV SOLN: INTRAVENOUS | Status: DC

## 2013-04-28 MED ORDER — DICYCLOMINE HCL 10 MG/ML IM SOLN
20.0000 mg | Freq: Once | INTRAMUSCULAR | Status: AC
  Administered 2013-04-28: 20 mg via INTRAMUSCULAR
  Filled 2013-04-28: qty 2

## 2013-04-28 MED ORDER — IOHEXOL 300 MG/ML  SOLN
100.0000 mL | Freq: Once | INTRAMUSCULAR | Status: AC | PRN
  Administered 2013-04-28: 100 mL via INTRAVENOUS

## 2013-04-28 MED ORDER — METRONIDAZOLE 500 MG PO TABS: 500.0000 mg | ORAL_TABLET | Freq: Three times a day (TID) | ORAL | Status: DC

## 2013-04-28 MED ORDER — IOHEXOL 300 MG/ML  SOLN
50.0000 mL | Freq: Once | INTRAMUSCULAR | Status: AC | PRN
  Administered 2013-04-28: 50 mL via ORAL

## 2013-04-28 MED ORDER — ONDANSETRON HCL 4 MG PO TABS: 4.0000 mg | ORAL_TABLET | Freq: Three times a day (TID) | ORAL | Status: DC | PRN

## 2013-04-28 MED ORDER — POTASSIUM CHLORIDE 20 MEQ/15ML (10%) PO LIQD
40.0000 meq | Freq: Once | ORAL | Status: AC
  Administered 2013-04-28: 40 meq via ORAL
  Filled 2013-04-28: qty 30
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
## 2013-04-28 NOTE — ED Notes (Signed)
Pt reports diarrhea and abd pain x 4 weeks.  PT says now has rectal pain from so much diarrhea.  Reports nausea, no vomiting.

## 2013-04-28 NOTE — ED Notes (Signed)
EDP in again to update

## 2013-04-29 LAB — GI PATHOGEN PANEL BY PCR, STOOL
C difficile toxin A/B: NEGATIVE
Campylobacter by PCR: NEGATIVE
Cryptosporidium by PCR: NEGATIVE
E coli (ETEC) LT/ST: NEGATIVE
Norovirus GI/GII: NEGATIVE
Rotavirus A by PCR: NEGATIVE

## 2013-05-05 ENCOUNTER — Encounter (INDEPENDENT_AMBULATORY_CARE_PROVIDER_SITE_OTHER): Payer: Self-pay | Admitting: Internal Medicine

## 2013-05-05 ENCOUNTER — Ambulatory Visit (INDEPENDENT_AMBULATORY_CARE_PROVIDER_SITE_OTHER): Payer: Medicare Other | Admitting: Internal Medicine

## 2013-05-05 VITALS — BP 118/70 | HR 60 | Ht 65.0 in | Wt 124.3 lb

## 2013-05-05 DIAGNOSIS — K529 Noninfective gastroenteritis and colitis, unspecified: Secondary | ICD-10-CM | POA: Insufficient documentation

## 2013-05-05 DIAGNOSIS — K5289 Other specified noninfective gastroenteritis and colitis: Secondary | ICD-10-CM

## 2013-05-05 LAB — CBC WITH DIFFERENTIAL/PLATELET
Basophils Relative: 1 % (ref 0–1)
Eosinophils Absolute: 0.3 10*3/uL (ref 0.0–0.7)
Hemoglobin: 13 g/dL (ref 12.0–15.0)
Lymphs Abs: 3.5 10*3/uL (ref 0.7–4.0)
MCH: 31.3 pg (ref 26.0–34.0)
MCHC: 34.3 g/dL (ref 30.0–36.0)
Monocytes Relative: 10 % (ref 3–12)
Neutro Abs: 5.2 10*3/uL (ref 1.7–7.7)
Neutrophils Relative %: 51 % (ref 43–77)
Platelets: 264 10*3/uL (ref 150–400)
RBC: 4.15 MIL/uL (ref 3.87–5.11)

## 2013-05-05 LAB — SEDIMENTATION RATE: Sed Rate: 11 mm/hr (ref 0–22)

## 2013-05-05 NOTE — Patient Instructions (Addendum)
OV in 3 weeks. CBC and sedrate. Try Imodium BID as needed.

## 2013-05-05 NOTE — Progress Notes (Signed)
Subjective:     Patient ID: Sarah Ochoa, female   DOB: 1961-09-09, 52 y.o.   MRN: [id number]  HPI Referred to our office for f/u for diarrhea. She has been in the ED for same.  She underwent a CT  Which revealed enteritis and colitis. She has had diarrhea x 4 weeks. She is having 7-8 stools a day before going to the ED. Now she is having 5 stools a day. Stools are loose but not watery.  Normally she will have 2 stools a day. Presently taking Cipro and Flagyl.  She has some lower mid abdominal pain. She rates the pain at an 8. There was a fever. She says her temp was 101.5. Her stools are dark now. No melena or bright red rectal bleeding.  She has pain when she goes to the bathroom. She has had some nausea and vomiting for just a few days. She does now have occasionally nausea. There has been no fever since she was in the ED.  She has not been out of the country. No antibiotics before going to the ED. No raw meats. She tells me she feel 80% better. No family hx of Crohn's Appetite has not been good since being sick. She thinks she may have lost about 4 pounds in the past 4 weeks. 04/28/2013 CT abdomen/pelvis with CM: Evidence of a degree of enteritis and colitis. There is rather  marked fold thickening in the sigmoid colon. There are a few  sigmoid diverticula, but there is no well-defined diverticular  inflammation.  No abscess. Appendix appears normal. No bowel obstruction.  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   04/28/2013 GI pathogen: Negative.   12/20/2012 CT abdomen/pelvis with CM: IMPRESSION:  1. Mild extrahepatic biliary dilatation, etiology uncertain.  2. Diffuse hepatic steatosis.  3. 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 Before distal duodenal caliber is at the upper  normal range, although this may be incidental note is no underlying  cause is observed.    Review of Systems Family: She is divorced. Two daugthers in good  health. She is disabled from asthma. She has worked at [employer].      Objective:   Physical Exam  Filed Vitals:   05/05/13 1108  BP: 118/70  Pulse: 60  Height: 5\' 5"  (1.651 m)  Weight: 124 lb 4.8 oz (56.382 kg)  Alert and oriented. Skin warm and dry. Oral mucosa is moist.   . Sclera anicteric, conjunctivae is pink. Thyroid not enlarged. No cervical lymphadenopathy. Lungs clear. Heart regular rate and rhythm.  Abdomen is soft. Bowel sounds are positive. No hepatomegaly. No abdominal masses felt. Diffuse abdominal tenderness. Her abdomen is soft and her BS+. .  No edema to lower extremities.         Assessment:   Possible gastroenteritis. She is 80% better. She is almost finished with the Cipro and Flagyl.     Plan:    CBC, Sedrate today. OV in 3 weeks to reevaluate. We will repeat a CT abdomen/pelvis with CM next office visit if not better.

## 2013-05-14 ENCOUNTER — Ambulatory Visit (HOSPITAL_COMMUNITY)
Admission: RE | Admit: 2013-05-14 | Discharge: 2013-05-14 | Disposition: A | Discharge status: Home / Self Care | Payer: Medicare Other | Source: Ambulatory Visit | Attending: Family Medicine | Admitting: Family Medicine

## 2013-05-14 ENCOUNTER — Other Ambulatory Visit (HOSPITAL_COMMUNITY): Payer: Self-pay | Admitting: Family Medicine

## 2013-05-14 DIAGNOSIS — T07XXXA Unspecified multiple injuries, initial encounter: Secondary | ICD-10-CM | POA: Insufficient documentation

## 2013-05-14 DIAGNOSIS — M5430 Sciatica, unspecified side: Secondary | ICD-10-CM

## 2013-05-14 DIAGNOSIS — M545 Low back pain, unspecified: Secondary | ICD-10-CM | POA: Insufficient documentation

## 2013-05-14 DIAGNOSIS — W19XXXA Unspecified fall, initial encounter: Secondary | ICD-10-CM | POA: Insufficient documentation

## 2013-05-14 DIAGNOSIS — M25559 Pain in unspecified hip: Secondary | ICD-10-CM | POA: Insufficient documentation

## 2013-05-22 ENCOUNTER — Encounter (HOSPITAL_COMMUNITY): Payer: Self-pay

## 2013-05-22 DIAGNOSIS — Z79899 Other long term (current) drug therapy: Secondary | ICD-10-CM | POA: Insufficient documentation

## 2013-05-22 DIAGNOSIS — R509 Fever, unspecified: Secondary | ICD-10-CM | POA: Insufficient documentation

## 2013-05-22 DIAGNOSIS — J4489 Other specified chronic obstructive pulmonary disease: Secondary | ICD-10-CM | POA: Insufficient documentation

## 2013-05-22 DIAGNOSIS — IMO0001 Reserved for inherently not codable concepts without codable children: Secondary | ICD-10-CM | POA: Insufficient documentation

## 2013-05-22 DIAGNOSIS — J449 Chronic obstructive pulmonary disease, unspecified: Secondary | ICD-10-CM | POA: Insufficient documentation

## 2013-05-22 DIAGNOSIS — G8929 Other chronic pain: Secondary | ICD-10-CM | POA: Insufficient documentation

## 2013-05-22 DIAGNOSIS — K219 Gastro-esophageal reflux disease without esophagitis: Secondary | ICD-10-CM | POA: Insufficient documentation

## 2013-05-22 DIAGNOSIS — F341 Dysthymic disorder: Secondary | ICD-10-CM | POA: Insufficient documentation

## 2013-05-22 DIAGNOSIS — F17200 Nicotine dependence, unspecified, uncomplicated: Secondary | ICD-10-CM | POA: Insufficient documentation

## 2013-05-22 DIAGNOSIS — IMO0002 Reserved for concepts with insufficient information to code with codable children: Secondary | ICD-10-CM | POA: Insufficient documentation

## 2013-05-22 DIAGNOSIS — I10 Essential (primary) hypertension: Secondary | ICD-10-CM | POA: Insufficient documentation

## 2013-05-22 NOTE — ED Notes (Signed)
Fever and chills since Monday, took tyleno at 8 pm

## 2013-05-23 ENCOUNTER — Other Ambulatory Visit: Payer: Self-pay | Admitting: Emergency Medicine

## 2013-05-23 ENCOUNTER — Emergency Department (HOSPITAL_COMMUNITY): Payer: Medicare Other

## 2013-05-23 ENCOUNTER — Emergency Department (HOSPITAL_COMMUNITY)
Admission: EM | Admit: 2013-05-23 | Discharge: 2013-05-23 | Disposition: A | Discharge status: Home / Self Care | Payer: Medicare Other | Attending: Emergency Medicine | Admitting: Emergency Medicine

## 2013-05-23 DIAGNOSIS — R509 Fever, unspecified: Secondary | ICD-10-CM

## 2013-05-23 LAB — CBC WITH DIFFERENTIAL/PLATELET
Band Neutrophils: 0 % (ref 0–10)
Basophils Absolute: 0.1 10*3/uL (ref 0.0–0.1)
Basophils Relative: 2 % — ABNORMAL HIGH (ref 0–1)
HCT: 39.2 % (ref 36.0–46.0)
Hemoglobin: 13.4 g/dL (ref 12.0–15.0)
Lymphocytes Relative: 50 % — ABNORMAL HIGH (ref 12–46)
Lymphs Abs: 2.4 10*3/uL (ref 0.7–4.0)
MCH: 31.2 pg (ref 26.0–34.0)
MCHC: 34.2 g/dL (ref 30.0–36.0)
MCV: 91.4 fL (ref 78.0–100.0)
Metamyelocytes Relative: 0 %
Myelocytes: 0 %
Promyelocytes Absolute: 0 %

## 2013-05-23 LAB — URINALYSIS, ROUTINE W REFLEX MICROSCOPIC
Ketones, ur: NEGATIVE mg/dL
Leukocytes, UA: NEGATIVE
Nitrite: NEGATIVE
Protein, ur: NEGATIVE mg/dL
Urobilinogen, UA: 0.2 mg/dL (ref 0.0–1.0)

## 2013-05-23 LAB — BASIC METABOLIC PANEL
Calcium: 9.6 mg/dL (ref 8.4–10.5)
Creatinine, Ser: 0.86 mg/dL (ref 0.50–1.10)
GFR calc Af Amer: 90 mL/min — ABNORMAL LOW (ref >90)
Sodium: 136 mEq/L (ref 135–145)

## 2013-05-23 LAB — URINE MICROSCOPIC-ADD ON

## 2013-05-23 MED ORDER — SODIUM CHLORIDE 0.9 % IV BOLUS (SEPSIS)
1000.0000 mL | Freq: Once | INTRAVENOUS | Status: AC
  Administered 2013-05-23: 1000 mL via INTRAVENOUS

## 2013-05-23 MED ORDER — IBUPROFEN 800 MG PO TABS
800.0000 mg | ORAL_TABLET | Freq: Once | ORAL | Status: AC
  Administered 2013-05-23: 800 mg via ORAL
  Filled 2013-05-23: qty 1

## 2013-05-23 MED ORDER — IBUPROFEN 800 MG PO TABS: 800.0000 mg | ORAL_TABLET | Freq: Three times a day (TID) | ORAL | Status: DC

## 2013-05-23 MED ORDER — ACETAMINOPHEN 325 MG PO TABS
650.0000 mg | ORAL_TABLET | Freq: Once | ORAL | Status: AC
  Administered 2013-05-23: 650 mg via ORAL
  Filled 2013-05-23: qty 2

## 2013-05-23 NOTE — ED Notes (Signed)
Pt called for a ride, Waiting for fluids to complete

## 2013-05-23 NOTE — ED Notes (Signed)
Pt states she took tylenol at home before coming in

## 2013-05-23 NOTE — ED Notes (Signed)
Pt drinking ginger ale  

## 2013-05-23 NOTE — ED Notes (Signed)
Pt is sweating, iv infusing w/o difficulty
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
## 2013-05-25 ENCOUNTER — Ambulatory Visit (INDEPENDENT_AMBULATORY_CARE_PROVIDER_SITE_OTHER): Payer: Medicare Other | Admitting: Internal Medicine

## 2013-05-26 ENCOUNTER — Other Ambulatory Visit (INDEPENDENT_AMBULATORY_CARE_PROVIDER_SITE_OTHER): Payer: Self-pay | Admitting: *Deleted

## 2013-05-26 ENCOUNTER — Telehealth (INDEPENDENT_AMBULATORY_CARE_PROVIDER_SITE_OTHER): Payer: Self-pay | Admitting: *Deleted

## 2013-05-26 ENCOUNTER — Ambulatory Visit (INDEPENDENT_AMBULATORY_CARE_PROVIDER_SITE_OTHER): Payer: Medicare Other | Admitting: Internal Medicine

## 2013-05-26 ENCOUNTER — Encounter (INDEPENDENT_AMBULATORY_CARE_PROVIDER_SITE_OTHER): Payer: Self-pay | Admitting: Internal Medicine

## 2013-05-26 VITALS — BP 138/70 | HR 68 | Temp 98.2°F | Ht 65.0 in | Wt 125.7 lb

## 2013-05-26 DIAGNOSIS — Z1211 Encounter for screening for malignant neoplasm of colon: Secondary | ICD-10-CM

## 2013-05-26 DIAGNOSIS — K529 Noninfective gastroenteritis and colitis, unspecified: Secondary | ICD-10-CM

## 2013-05-26 DIAGNOSIS — K5289 Other specified noninfective gastroenteritis and colitis: Secondary | ICD-10-CM

## 2013-05-26 NOTE — Telephone Encounter (Signed)
Patient needs trilyte 
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
## 2013-05-26 NOTE — Patient Instructions (Addendum)
Screening colonoscopy 

## 2013-05-27 MED ORDER — PEG 3350-KCL-NA BICARB-NACL 420 G PO SOLR: 4000.0000 mL | Freq: Once | ORAL | Status: DC

## 2013-06-07 ENCOUNTER — Encounter (HOSPITAL_COMMUNITY): Payer: Self-pay | Admitting: Pharmacy Technician

## 2013-06-17 ENCOUNTER — Ambulatory Visit (HOSPITAL_COMMUNITY)
Admission: RE | Admit: 2013-06-17 | Discharge: 2013-06-17 | Disposition: A | Discharge status: Home / Self Care | Payer: Medicare Other | Source: Ambulatory Visit | Attending: Internal Medicine | Admitting: Internal Medicine

## 2013-06-17 ENCOUNTER — Encounter (HOSPITAL_COMMUNITY): Payer: Self-pay | Admitting: *Deleted

## 2013-06-17 ENCOUNTER — Encounter (HOSPITAL_COMMUNITY)
Admission: RE | Disposition: A | Discharge status: Home / Self Care | Payer: Self-pay | Source: Ambulatory Visit | Attending: Internal Medicine

## 2013-06-17 DIAGNOSIS — J449 Chronic obstructive pulmonary disease, unspecified: Secondary | ICD-10-CM | POA: Insufficient documentation

## 2013-06-17 DIAGNOSIS — J4489 Other specified chronic obstructive pulmonary disease: Secondary | ICD-10-CM | POA: Insufficient documentation

## 2013-06-17 DIAGNOSIS — K644 Residual hemorrhoidal skin tags: Secondary | ICD-10-CM

## 2013-06-17 DIAGNOSIS — K529 Noninfective gastroenteritis and colitis, unspecified: Secondary | ICD-10-CM

## 2013-06-17 DIAGNOSIS — Z1211 Encounter for screening for malignant neoplasm of colon: Secondary | ICD-10-CM | POA: Insufficient documentation

## 2013-06-17 DIAGNOSIS — I10 Essential (primary) hypertension: Secondary | ICD-10-CM | POA: Insufficient documentation

## 2013-06-17 HISTORY — PX: COLONOSCOPY: SHX5424

## 2013-06-17 SURGERY — COLONOSCOPY
Anesthesia: Moderate Sedation

## 2013-06-17 MED ORDER — SODIUM CHLORIDE 0.9 % IV SOLN
INTRAVENOUS | Status: DC
  Administered 2013-06-17: 1000 mL via INTRAVENOUS

## 2013-06-17 MED ORDER — MEPERIDINE HCL 50 MG/ML IJ SOLN
INTRAMUSCULAR | Status: AC
  Filled 2013-06-17: qty 1

## 2013-06-17 MED ORDER — MEPERIDINE HCL 50 MG/ML IJ SOLN
INTRAMUSCULAR | Status: DC | PRN
  Administered 2013-06-17 (×2): 25 mg via INTRAVENOUS

## 2013-06-17 MED ORDER — STERILE WATER FOR IRRIGATION IR SOLN
Status: DC | PRN
  Administered 2013-06-17: 13:00:00

## 2013-06-17 MED ORDER — MIDAZOLAM HCL 5 MG/5ML IJ SOLN
INTRAMUSCULAR | Status: DC | PRN
  Administered 2013-06-17 (×6): 2 mg via INTRAVENOUS

## 2013-06-17 MED ORDER — MIDAZOLAM HCL 5 MG/5ML IJ SOLN
INTRAMUSCULAR | Status: AC
  Filled 2013-06-17: qty 15

## 2013-06-17 NOTE — Op Note (Signed)
COLONOSCOPY PROCEDURE REPORT  PATIENT:  Sarah Ochoa  MR#:  [id number] Birthdate:  Apr 25, 1961, 52 y.o., female Endoscopist:  Dr. Malissa Hippo, MD Referred By:  Dr. Mila Homer. Sudie Bailey, MD Procedure Date: 06/17/2013  Procedure:   Colonoscopy  Indications: Patient is 52 year old African female who is here for average risk screening colonoscopy. She developed acute diarrhea for 6 weeks ago and CT revealed changes of enterocolitis. Her symptoms of fairly completely resolved with antibiotic.  Informed Consent:  The procedure and risks were reviewed with the patient and informed consent was obtained.  Medications:  Demerol 50 mg IV Versed 12 mg IV  Description of procedure:  After a digital rectal exam was performed, that colonoscope was advanced from the anus through the rectum and colon to the area of the cecum, ileocecal valve and appendiceal orifice. The cecum was deeply intubated. These structures were well-seen and photographed for the record. From the level of the cecum and ileocecal valve, the scope was slowly and cautiously withdrawn. The mucosal surfaces were carefully surveyed utilizing scope tip to flexion to facilitate fold flattening as needed. The scope was pulled down into the rectum where a thorough exam including retroflexion was performed. Terminal ileum was also examined.  Findings:   Prep excellent. Normal mucosa of terminal ileum. Normal mucosa of colon and rectum. Small hemorrhoids below the dentate line.    Therapeutic/Diagnostic Maneuvers Performed:  None  Complications:  None  Cecal Withdrawal Time:  7  minutes  Impression:  Normal mucosa of terminal ileum. Normal colonoscopy except small external hemorrhoids.  Recommendations:  Standard instructions given. Next screening exam in 10 years.  Siddhant Hashemi U  06/17/2013 1:28 PM  CC: Dr. Milana Obey, MD & Dr. Bonnetta Barry ref. provider found
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
## 2013-06-17 NOTE — Op Note (Signed)
Xylocaine jelly applied to rectal area per Dr. Karilyn Cota

## 2013-06-21 ENCOUNTER — Encounter (HOSPITAL_COMMUNITY): Payer: Self-pay | Admitting: Internal Medicine

## 2013-08-11 ENCOUNTER — Other Ambulatory Visit (HOSPITAL_COMMUNITY): Payer: Self-pay | Admitting: Orthopaedic Surgery

## 2013-08-11 DIAGNOSIS — M8700 Idiopathic aseptic necrosis of unspecified bone: Secondary | ICD-10-CM

## 2013-08-11 DIAGNOSIS — M25552 Pain in left hip: Secondary | ICD-10-CM

## 2013-08-17 ENCOUNTER — Ambulatory Visit (HOSPITAL_COMMUNITY)
Admission: RE | Admit: 2013-08-17 | Discharge: 2013-08-17 | Disposition: A | Discharge status: Home / Self Care | Payer: Medicare Other | Source: Ambulatory Visit | Attending: Orthopaedic Surgery | Admitting: Orthopaedic Surgery

## 2013-08-17 DIAGNOSIS — M87059 Idiopathic aseptic necrosis of unspecified femur: Secondary | ICD-10-CM | POA: Insufficient documentation

## 2013-08-17 DIAGNOSIS — M47817 Spondylosis without myelopathy or radiculopathy, lumbosacral region: Secondary | ICD-10-CM | POA: Insufficient documentation

## 2013-08-17 DIAGNOSIS — M25552 Pain in left hip: Secondary | ICD-10-CM

## 2013-08-17 DIAGNOSIS — M25559 Pain in unspecified hip: Secondary | ICD-10-CM | POA: Insufficient documentation

## 2014-01-18 ENCOUNTER — Encounter (HOSPITAL_COMMUNITY): Payer: Self-pay | Admitting: Emergency Medicine

## 2014-01-18 ENCOUNTER — Emergency Department (HOSPITAL_COMMUNITY)
Admission: EM | Admit: 2014-01-18 | Discharge: 2014-01-18 | Disposition: A | Discharge status: Home / Self Care | Payer: Medicare Other | Attending: Emergency Medicine | Admitting: Emergency Medicine

## 2014-01-18 DIAGNOSIS — F329 Major depressive disorder, single episode, unspecified: Secondary | ICD-10-CM | POA: Insufficient documentation

## 2014-01-18 DIAGNOSIS — K219 Gastro-esophageal reflux disease without esophagitis: Secondary | ICD-10-CM | POA: Insufficient documentation

## 2014-01-18 DIAGNOSIS — W57XXXA Bitten or stung by nonvenomous insect and other nonvenomous arthropods, initial encounter: Secondary | ICD-10-CM

## 2014-01-18 DIAGNOSIS — Y939 Activity, unspecified: Secondary | ICD-10-CM | POA: Insufficient documentation

## 2014-01-18 DIAGNOSIS — R6889 Other general symptoms and signs: Secondary | ICD-10-CM | POA: Insufficient documentation

## 2014-01-18 DIAGNOSIS — J449 Chronic obstructive pulmonary disease, unspecified: Secondary | ICD-10-CM | POA: Insufficient documentation

## 2014-01-18 DIAGNOSIS — Z8739 Personal history of other diseases of the musculoskeletal system and connective tissue: Secondary | ICD-10-CM | POA: Insufficient documentation

## 2014-01-18 DIAGNOSIS — J4489 Other specified chronic obstructive pulmonary disease: Secondary | ICD-10-CM | POA: Insufficient documentation

## 2014-01-18 DIAGNOSIS — F17200 Nicotine dependence, unspecified, uncomplicated: Secondary | ICD-10-CM | POA: Insufficient documentation

## 2014-01-18 DIAGNOSIS — G8929 Other chronic pain: Secondary | ICD-10-CM | POA: Insufficient documentation

## 2014-01-18 DIAGNOSIS — S00209A Unspecified superficial injury of unspecified eyelid and periocular area, initial encounter: Secondary | ICD-10-CM | POA: Insufficient documentation

## 2014-01-18 DIAGNOSIS — Y929 Unspecified place or not applicable: Secondary | ICD-10-CM | POA: Insufficient documentation

## 2014-01-18 DIAGNOSIS — Z79899 Other long term (current) drug therapy: Secondary | ICD-10-CM | POA: Insufficient documentation

## 2014-01-18 DIAGNOSIS — F411 Generalized anxiety disorder: Secondary | ICD-10-CM | POA: Insufficient documentation

## 2014-01-18 DIAGNOSIS — I10 Essential (primary) hypertension: Secondary | ICD-10-CM | POA: Insufficient documentation

## 2014-01-18 DIAGNOSIS — IMO0002 Reserved for concepts with insufficient information to code with codable children: Secondary | ICD-10-CM | POA: Insufficient documentation

## 2014-01-18 DIAGNOSIS — F3289 Other specified depressive episodes: Secondary | ICD-10-CM | POA: Insufficient documentation

## 2014-01-18 MED ORDER — DOXYCYCLINE HYCLATE 100 MG PO CAPS: 100.0000 mg | ORAL_CAPSULE | Freq: Two times a day (BID) | ORAL | Status: DC

## 2014-01-18 NOTE — ED Notes (Signed)
Pt with tick embedded in the R upper eyelid, unknown bite date.

## 2014-01-18 NOTE — Discharge Instructions (Signed)
Tick Bite Information Ticks are insects that attach themselves to the skin and draw blood for food. There are various types of ticks. Common types include wood ticks and deer ticks. Most ticks live in shrubs and grassy areas. Ticks can climb onto your body when you make contact with leaves or grass where the tick is waiting. The most common places on the body for ticks to attach themselves are the scalp, neck, armpits, waist, and groin. Most tick bites are harmless, but sometimes ticks carry germs that cause diseases. These germs can be spread to a person during the tick's feeding process. The chance of a disease spreading through a tick bite depends on:   The type of tick.  Time of year.   How long the tick is attached.   Geographic location.  HOW CAN YOU PREVENT TICK BITES? Take these steps to help prevent tick bites when you are outdoors:  Wear protective clothing. Long sleeves and long pants are best.   Wear white clothes so you can see ticks more easily.  Tuck your pant legs into your socks.   If walking on a trail, stay in the middle of the trail to avoid brushing against bushes.  Avoid walking through areas with long grass.  Put insect repellent on all exposed skin and along boot tops, pant legs, and sleeve cuffs.   Check clothing, hair, and skin repeatedly and before going inside.   Brush off any ticks that are not attached.  Take a shower or bath as soon as possible after being outdoors.  WHAT IS THE PROPER WAY TO REMOVE A TICK? Ticks should be removed as soon as possible to help prevent diseases caused by tick bites. 1. If latex gloves are available, put them on before trying to remove a tick.  2. Using fine-point tweezers, grasp the tick as close to the skin as possible. You may also use curved forceps or a tick removal tool. Grasp the tick as close to its head as possible. Avoid grasping the tick on its body. 3. Pull gently with steady upward pressure until  the tick lets go. Do not twist the tick or jerk it suddenly. This may break off the tick's head or mouth parts. 4. Do not squeeze or crush the tick's body. This could force disease-carrying fluids from the tick into your body.  5. After the tick is removed, wash the bite area and your hands with soap and water or other disinfectant such as alcohol. 6. Apply a small amount of antiseptic cream or ointment to the bite site.  7. Wash and disinfect any instruments that were used.  Do not try to remove a tick by applying a hot match, petroleum jelly, or fingernail polish to the tick. These methods do not work and may increase the chances of disease being spread from the tick bite.  WHEN SHOULD YOU SEEK MEDICAL CARE? Contact your health care provider if you are unable to remove a tick from your skin or if a part of the tick breaks off and is stuck in the skin.  After a tick bite, you need to be aware of signs and symptoms that could be related to diseases spread by ticks. Contact your health care provider if you develop any of the following in the days or weeks after the tick bite:  Unexplained fever.  Rash. A circular rash that appears days or weeks after the tick bite may indicate the possibility of Lyme disease. The rash may resemble   a target with a bull's-eye and may occur at a different part of your body than the tick bite.  Redness and swelling in the area of the tick bite.   Tender, swollen lymph glands.   Diarrhea.   Weight loss.   Cough.   Fatigue.   Muscle, joint, or bone pain.   Abdominal pain.   Headache.   Lethargy or a change in your level of consciousness.  Difficulty walking or moving your legs.   Numbness in the legs.   Paralysis.  Shortness of breath.   Confusion.   Repeated vomiting.  Document Released: 08/30/2000 Document Revised: 06/23/2013 Document Reviewed: 02/10/2013 [employer] Patient Information 2014 [employer].  
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
## 2014-01-20 NOTE — ED Provider Notes (Signed)
Medical screening examination/treatment/procedure(s) were performed by non-physician practitioner and as supervising physician I was immediately available for consultation/collaboration.   EKG Interpretation None        Sharyon Cable, MD 01/20/14 [phone]

## 2014-05-16 ENCOUNTER — Emergency Department (HOSPITAL_COMMUNITY)
Admission: EM | Admit: 2014-05-16 | Discharge: 2014-05-16 | Disposition: A | Discharge status: Home / Self Care | Payer: Medicare Other | Attending: Emergency Medicine | Admitting: Emergency Medicine

## 2014-05-16 ENCOUNTER — Encounter (HOSPITAL_COMMUNITY): Payer: Self-pay | Admitting: Emergency Medicine

## 2014-05-16 ENCOUNTER — Emergency Department (HOSPITAL_COMMUNITY): Payer: Medicare Other

## 2014-05-16 DIAGNOSIS — Z79899 Other long term (current) drug therapy: Secondary | ICD-10-CM | POA: Diagnosis not present

## 2014-05-16 DIAGNOSIS — R059 Cough, unspecified: Secondary | ICD-10-CM | POA: Insufficient documentation

## 2014-05-16 DIAGNOSIS — G8929 Other chronic pain: Secondary | ICD-10-CM | POA: Diagnosis not present

## 2014-05-16 DIAGNOSIS — J441 Chronic obstructive pulmonary disease with (acute) exacerbation: Secondary | ICD-10-CM | POA: Diagnosis not present

## 2014-05-16 DIAGNOSIS — IMO0002 Reserved for concepts with insufficient information to code with codable children: Secondary | ICD-10-CM | POA: Diagnosis not present

## 2014-05-16 DIAGNOSIS — F411 Generalized anxiety disorder: Secondary | ICD-10-CM | POA: Diagnosis not present

## 2014-05-16 DIAGNOSIS — F329 Major depressive disorder, single episode, unspecified: Secondary | ICD-10-CM | POA: Insufficient documentation

## 2014-05-16 DIAGNOSIS — M549 Dorsalgia, unspecified: Secondary | ICD-10-CM | POA: Diagnosis not present

## 2014-05-16 DIAGNOSIS — I10 Essential (primary) hypertension: Secondary | ICD-10-CM | POA: Insufficient documentation

## 2014-05-16 DIAGNOSIS — K219 Gastro-esophageal reflux disease without esophagitis: Secondary | ICD-10-CM | POA: Insufficient documentation

## 2014-05-16 DIAGNOSIS — R05 Cough: Secondary | ICD-10-CM | POA: Diagnosis present

## 2014-05-16 DIAGNOSIS — J45901 Unspecified asthma with (acute) exacerbation: Principal | ICD-10-CM

## 2014-05-16 DIAGNOSIS — F3289 Other specified depressive episodes: Secondary | ICD-10-CM | POA: Diagnosis not present

## 2014-05-16 MED ORDER — MINOCYCLINE HCL 100 MG PO CAPS: 100.0000 mg | ORAL_CAPSULE | Freq: Two times a day (BID) | ORAL | Status: DC

## 2014-05-16 MED ORDER — DOXYCYCLINE HYCLATE 100 MG PO TABS
100.0000 mg | ORAL_TABLET | Freq: Once | ORAL | Status: AC
  Administered 2014-05-16: 100 mg via ORAL
  Filled 2014-05-16: qty 1

## 2014-05-16 MED ORDER — PROMETHAZINE-DM 6.25-15 MG/5ML PO SYRP: ORAL_SOLUTION | ORAL | Status: DC

## 2014-05-16 MED ORDER — PREDNISONE 50 MG PO TABS
60.0000 mg | ORAL_TABLET | Freq: Once | ORAL | Status: AC
  Administered 2014-05-16: 13:00:00 60 mg via ORAL
  Filled 2014-05-16 (×2): qty 1

## 2014-05-16 MED ORDER — ALBUTEROL SULFATE HFA 108 (90 BASE) MCG/ACT IN AERS
2.0000 | INHALATION_SPRAY | Freq: Once | RESPIRATORY_TRACT | Status: AC
  Administered 2014-05-16: 2 via RESPIRATORY_TRACT
  Filled 2014-05-16: qty 6.7

## 2014-05-16 MED ORDER — HYDROCOD POLST-CHLORPHEN POLST 10-8 MG/5ML PO LQCR
5.0000 mL | Freq: Once | ORAL | Status: AC
  Administered 2014-05-16: 5 mL via ORAL
  Filled 2014-05-16: qty 5

## 2014-05-16 MED ORDER — DEXAMETHASONE 4 MG PO TABS: 4.0000 mg | ORAL_TABLET | Freq: Two times a day (BID) | ORAL | Status: DC

## 2014-05-16 NOTE — Discharge Instructions (Signed)
Chronic Obstructive Pulmonary Disease Exacerbation  Chronic obstructive pulmonary disease (COPD) is a common lung problem. In COPD, the flow of air from the lungs is limited. COPD exacerbations are times that breathing gets worse and you need extra treatment. Without treatment they can be life threatening. If they happen often, your lungs can become more damaged. PLEASE STOP SMOKING. HOME CARE  Do not smoke.  Avoid tobacco smoke and other things that bother your lungs.  If given, take your antibiotic medicine as told. Finish the medicine even if you start to feel better.  Only take medicines as told by your doctor.  Drink enough fluids to keep your pee (urine) clear or pale yellow (unless your doctor has told you not to).  Use a cool mist machine (vaporizer).  If you use oxygen or a machine that turns liquid medicine into a mist (nebulizer), continue to use them as told.  Keep up with shots (vaccinations) as told by your doctor.  Exercise regularly.  Eat healthy foods.  Keep all doctor visits as told. GET HELP RIGHT AWAY IF:  You are very short of breath and it gets worse.  You have trouble talking.  You have bad chest pain.  You have blood in your spit (sputum).  You have a fever.  You keep throwing up (vomiting).  You feel weak, or you pass out (faint).  You feel confused.  You keep getting worse. MAKE SURE YOU:   Understand these instructions.  Will watch your condition.  Will get help right away if you are not doing well or get worse. Document Released: 08/22/2011 Document Revised: 06/23/2013 Document Reviewed: 05/07/2013 [facility] Patient Information 2015 [place], [place]. This information is not intended to replace advice given to you by your health care provider. Make sure you discuss any questions you have with your health care provider.
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
## 2014-05-16 NOTE — ED Notes (Signed)
Pt c/o cough x 1 week and pain in both hips.  Reports bilateral hip pain is chronic.

## 2014-05-17 NOTE — ED Provider Notes (Signed)
Medical screening examination/treatment/procedure(s) were performed by non-physician practitioner and as supervising physician I was immediately available for consultation/collaboration.   EKG Interpretation None       Babette Relic, MD 05/17/14 2024

## 2014-06-13 ENCOUNTER — Encounter (HOSPITAL_COMMUNITY): Payer: Self-pay | Admitting: Emergency Medicine

## 2014-06-13 ENCOUNTER — Emergency Department (HOSPITAL_COMMUNITY)
Admission: EM | Admit: 2014-06-13 | Discharge: 2014-06-13 | Discharge status: Against Medical Advice | Payer: Medicare Other | Attending: Emergency Medicine | Admitting: Emergency Medicine

## 2014-06-13 DIAGNOSIS — G8929 Other chronic pain: Secondary | ICD-10-CM | POA: Diagnosis not present

## 2014-06-13 DIAGNOSIS — M25559 Pain in unspecified hip: Secondary | ICD-10-CM | POA: Insufficient documentation

## 2014-06-13 DIAGNOSIS — J449 Chronic obstructive pulmonary disease, unspecified: Secondary | ICD-10-CM | POA: Insufficient documentation

## 2014-06-13 DIAGNOSIS — I10 Essential (primary) hypertension: Secondary | ICD-10-CM | POA: Insufficient documentation

## 2014-06-13 DIAGNOSIS — J4489 Other specified chronic obstructive pulmonary disease: Secondary | ICD-10-CM | POA: Insufficient documentation

## 2014-06-13 DIAGNOSIS — F17200 Nicotine dependence, unspecified, uncomplicated: Secondary | ICD-10-CM | POA: Insufficient documentation

## 2014-06-13 NOTE — ED Notes (Signed)
Left hip pain post fall

## 2014-06-13 NOTE — ED Notes (Signed)
Pain lt hip. Says it has been hurting "for a while" , then fell on it last night.  Points to lt hip as site of pain with radiation down lt thigh.  "Tired of waiting, , I'm ready to go home"

## 2014-06-13 NOTE — ED Notes (Signed)
Pt ambulatory out into hallway,  "My hip hurts and I'm going home"

## 2014-07-18 ENCOUNTER — Encounter (HOSPITAL_COMMUNITY): Payer: Self-pay | Admitting: Emergency Medicine

## 2014-09-30 NOTE — Progress Notes (Signed)
Called Dr. Trevor Mace office requested order surgery 10-14-14 pre op 10-07-14 Thanks

## 2014-10-06 ENCOUNTER — Other Ambulatory Visit (HOSPITAL_COMMUNITY): Payer: Self-pay

## 2014-10-06 ENCOUNTER — Other Ambulatory Visit (HOSPITAL_COMMUNITY): Payer: Self-pay | Admitting: Orthopaedic Surgery

## 2014-10-07 ENCOUNTER — Inpatient Hospital Stay (HOSPITAL_COMMUNITY): Admission: RE | Admit: 2014-10-07 | Payer: Medicare Other | Source: Ambulatory Visit

## 2014-10-10 ENCOUNTER — Encounter (HOSPITAL_COMMUNITY)
Admission: RE | Admit: 2014-10-10 | Discharge: 2014-10-10 | Disposition: A | Discharge status: Home / Self Care | Payer: Medicare Other | Source: Ambulatory Visit | Attending: Orthopaedic Surgery | Admitting: Orthopaedic Surgery

## 2014-10-10 ENCOUNTER — Encounter (HOSPITAL_COMMUNITY): Payer: Self-pay

## 2014-10-10 DIAGNOSIS — M1612 Unilateral primary osteoarthritis, left hip: Secondary | ICD-10-CM | POA: Diagnosis not present

## 2014-10-10 DIAGNOSIS — I10 Essential (primary) hypertension: Secondary | ICD-10-CM | POA: Insufficient documentation

## 2014-10-10 DIAGNOSIS — R001 Bradycardia, unspecified: Secondary | ICD-10-CM | POA: Diagnosis not present

## 2014-10-10 DIAGNOSIS — Z01812 Encounter for preprocedural laboratory examination: Secondary | ICD-10-CM | POA: Diagnosis not present

## 2014-10-10 DIAGNOSIS — Z882 Allergy status to sulfonamides status: Secondary | ICD-10-CM | POA: Diagnosis not present

## 2014-10-10 DIAGNOSIS — J449 Chronic obstructive pulmonary disease, unspecified: Secondary | ICD-10-CM | POA: Insufficient documentation

## 2014-10-10 DIAGNOSIS — F17200 Nicotine dependence, unspecified, uncomplicated: Secondary | ICD-10-CM | POA: Diagnosis not present

## 2014-10-10 DIAGNOSIS — J45909 Unspecified asthma, uncomplicated: Secondary | ICD-10-CM | POA: Insufficient documentation

## 2014-10-10 DIAGNOSIS — M87852 Other osteonecrosis, left femur: Secondary | ICD-10-CM | POA: Insufficient documentation

## 2014-10-10 DIAGNOSIS — Z79899 Other long term (current) drug therapy: Secondary | ICD-10-CM | POA: Insufficient documentation

## 2014-10-10 HISTORY — DX: Depression, unspecified: F32.A

## 2014-10-10 HISTORY — DX: Major depressive disorder, single episode, unspecified: F32.9

## 2014-10-10 HISTORY — DX: Anxiety disorder, unspecified: F41.9

## 2014-10-10 LAB — PROTIME-INR
INR: 1.01 (ref 0.00–1.49)
PROTHROMBIN TIME: 13.4 s (ref 11.6–15.2)

## 2014-10-10 LAB — URINALYSIS, ROUTINE W REFLEX MICROSCOPIC
BILIRUBIN URINE: NEGATIVE
Glucose, UA: NEGATIVE mg/dL
HGB URINE DIPSTICK: NEGATIVE
Ketones, ur: NEGATIVE mg/dL
LEUKOCYTES UA: NEGATIVE
Nitrite: NEGATIVE
PH: 5.5 (ref 5.0–8.0)
Protein, ur: NEGATIVE mg/dL
Specific Gravity, Urine: 1.024 (ref 1.005–1.030)
Urobilinogen, UA: 0.2 mg/dL (ref 0.0–1.0)

## 2014-10-10 LAB — CBC
HCT: 36.9 % (ref 36.0–46.0)
Hemoglobin: 12.3 g/dL (ref 12.0–15.0)
MCH: 30.7 pg (ref 26.0–34.0)
MCHC: 33.3 g/dL (ref 30.0–36.0)
MCV: 92 fL (ref 78.0–100.0)
PLATELETS: 276 10*3/uL (ref 150–400)
RBC: 4.01 MIL/uL (ref 3.87–5.11)
RDW: 15.1 % (ref 11.5–15.5)
WBC: 8.9 10*3/uL (ref 4.0–10.5)

## 2014-10-10 LAB — BASIC METABOLIC PANEL
ANION GAP: 7 (ref 5–15)
BUN: 20 mg/dL (ref 6–23)
CALCIUM: 9.5 mg/dL (ref 8.4–10.5)
CO2: 28 mmol/L (ref 19–32)
Chloride: 112 mmol/L (ref 96–112)
Creatinine, Ser: 0.65 mg/dL (ref 0.50–1.10)
GFR calc Af Amer: >90 mL/min (ref >90)
GLUCOSE: 106 mg/dL — AB (ref 70–99)
Potassium: 3 mmol/L — ABNORMAL LOW (ref 3.5–5.1)
SODIUM: 147 mmol/L — AB (ref 135–145)

## 2014-10-10 LAB — SURGICAL PCR SCREEN
MRSA, PCR: NEGATIVE
Staphylococcus aureus: NEGATIVE

## 2014-10-10 LAB — ABO/RH: ABO/RH(D): O POS

## 2014-10-10 LAB — APTT: APTT: 36 s (ref 24–37)

## 2014-10-10 NOTE — Patient Instructions (Addendum)
[address] KAROLYNN INFANTINO  10/10/2014   Your procedure is scheduled on:   10-14-2014 Friday  Enter through [facility]  Entrance and follow signs to [facility]. Arrive at     0900 AM.  Call this number if you have problems the morning of surgery: [phone]  Or Presurgical Testing [phone].   For Living Will and/or Health Care Power Attorney Forms: please provide copy for your medical record,may bring AM of surgery(Forms should be already notarized -we do not provide this service).(10-11-14 No information preferred today).    Do not eat food/ or drink: After Midnight.      Take these medicines the morning of surgery with A SIP OF WATER: Omeprazole. Sertraline. Singulair. Use/bring Inhalers. Flonase(if not using Mupirocin).  Do not wear jewelry, make-up or nail polish.  Do not wear deodorant, lotions, powders, or perfumes.   Do not shave legs and under arms- 48 hours(2 days) prior to first CHG shower.(Shaving face and neck okay.)  Do not bring valuables to the hospital.(Hospital is not responsible for lost valuables).  Contacts, dentures or removable bridgework, body piercing, hair pins may not be worn into surgery.  Leave suitcase in the car. After surgery it may be brought to your room.  For patients admitted to the hospital, checkout time is 11:00 AM the day of discharge.(Restricted visitors-Any Persons displaying flu-like symptoms or illness).    Patients discharged the day of surgery will not be allowed to drive home. Must have responsible person with you x 24 hours once discharged.  Name and phone number of your driver: Sheryle Hail [phone] cell     Please read over the following fact sheets that you were given:  CHG(Chlorhexidine Gluconate 4% Surgical Soap) use, MRSA Information.  Remember : Type/Screen "Blue armbands" - may not be removed once applied(would result in being retested AM of surgery, if removed).         [place] - Preparing for  Surgery Before surgery, you can play an important role.  Because skin is not sterile, your skin needs to be as free of germs as possible.  You can reduce the number of germs on your skin by washing with CHG (chlorahexidine gluconate) soap before surgery.  CHG is an antiseptic cleaner which kills germs and bonds with the skin to continue killing germs even after washing. Please DO NOT use if you have an allergy to CHG or antibacterial soaps.  If your skin becomes reddened/irritated stop using the CHG and inform your nurse when you arrive at Short Stay. Do not shave (including legs and underarms) for at least 48 hours prior to the first CHG shower.  You may shave your face/neck. Please follow these instructions carefully:  1.  Shower with CHG Soap the night before surgery and the  morning of Surgery.  2.  If you choose to wash your hair, wash your hair first as usual with your  normal  shampoo.  3.  After you shampoo, rinse your hair and body thoroughly to remove the  shampoo.                           4.  Use CHG as you would any other liquid soap.  You can apply chg directly  to the skin and wash                       Gently with a scrungie or  clean washcloth.  5.  Apply the CHG Soap to your body ONLY FROM THE NECK DOWN.   Do not use on face/ open                           Wound or open sores. Avoid contact with eyes, ears mouth and genitals (private parts).                       Wash face,  Genitals (private parts) with your normal soap.             6.  Wash thoroughly, paying special attention to the area where your surgery  will be performed.  7.  Thoroughly rinse your body with warm water from the neck down.  8.  DO NOT shower/wash with your normal soap after using and rinsing off  the CHG Soap.                9.  Pat yourself dry with a clean towel.            10.  Wear clean pajamas.            11.  Place clean sheets on your bed the night of your first shower and do not  sleep with pets. Day  of Surgery : Do not apply any lotions/deodorants the morning of surgery.  Please wear clean clothes to the hospital/surgery center.  FAILURE TO FOLLOW THESE INSTRUCTIONS MAY RESULT IN THE CANCELLATION OF YOUR SURGERY PATIENT SIGNATURE_________________________________  NURSE SIGNATURE__________________________________  ________________________________________________________________________

## 2014-10-10 NOTE — Pre-Procedure Instructions (Signed)
10-10-14 EKG done today, CXR 8'15 Epic.

## 2014-10-10 NOTE — Progress Notes (Signed)
10-10-14 EKG of today reviewed by Dr. Marcell Barlow, and compared to 02-28-13 Epic-"may proceed as planned.

## 2014-10-10 NOTE — Progress Notes (Signed)
10-10-14 1550 Potassium 3.0(pt. On diuretic and Potassium spplement) - please note labs viewable in Epic.

## 2014-10-14 ENCOUNTER — Encounter (HOSPITAL_COMMUNITY): Payer: Self-pay | Admitting: *Deleted

## 2014-10-14 ENCOUNTER — Inpatient Hospital Stay (HOSPITAL_COMMUNITY): Payer: Medicare Other

## 2014-10-14 ENCOUNTER — Inpatient Hospital Stay (HOSPITAL_COMMUNITY)
Admission: RE | Admit: 2014-10-14 | Discharge: 2014-10-16 | DRG: 470 | Disposition: A | Discharge status: Home Health | Payer: Medicare Other | Source: Ambulatory Visit | Attending: Orthopaedic Surgery | Admitting: Orthopaedic Surgery

## 2014-10-14 ENCOUNTER — Inpatient Hospital Stay (HOSPITAL_COMMUNITY): Payer: Medicare Other | Admitting: Anesthesiology

## 2014-10-14 ENCOUNTER — Encounter (HOSPITAL_COMMUNITY)
Admission: RE | Disposition: A | Discharge status: Home Health | Payer: Self-pay | Source: Ambulatory Visit | Attending: Orthopaedic Surgery

## 2014-10-14 DIAGNOSIS — F17210 Nicotine dependence, cigarettes, uncomplicated: Secondary | ICD-10-CM | POA: Diagnosis present

## 2014-10-14 DIAGNOSIS — J449 Chronic obstructive pulmonary disease, unspecified: Secondary | ICD-10-CM | POA: Diagnosis present

## 2014-10-14 DIAGNOSIS — Z90710 Acquired absence of both cervix and uterus: Secondary | ICD-10-CM

## 2014-10-14 DIAGNOSIS — M87052 Idiopathic aseptic necrosis of left femur: Principal | ICD-10-CM | POA: Diagnosis present

## 2014-10-14 DIAGNOSIS — Z419 Encounter for procedure for purposes other than remedying health state, unspecified: Secondary | ICD-10-CM

## 2014-10-14 DIAGNOSIS — Z01812 Encounter for preprocedural laboratory examination: Secondary | ICD-10-CM | POA: Diagnosis not present

## 2014-10-14 DIAGNOSIS — M1612 Unilateral primary osteoarthritis, left hip: Secondary | ICD-10-CM | POA: Diagnosis present

## 2014-10-14 DIAGNOSIS — M25552 Pain in left hip: Secondary | ICD-10-CM | POA: Diagnosis present

## 2014-10-14 DIAGNOSIS — I10 Essential (primary) hypertension: Secondary | ICD-10-CM | POA: Diagnosis present

## 2014-10-14 DIAGNOSIS — K219 Gastro-esophageal reflux disease without esophagitis: Secondary | ICD-10-CM | POA: Diagnosis present

## 2014-10-14 DIAGNOSIS — Z96642 Presence of left artificial hip joint: Secondary | ICD-10-CM

## 2014-10-14 HISTORY — PX: TOTAL HIP ARTHROPLASTY: SHX124

## 2014-10-14 LAB — TYPE AND SCREEN
ABO/RH(D): O POS
Antibody Screen: NEGATIVE

## 2014-10-14 SURGERY — ARTHROPLASTY, HIP, TOTAL, ANTERIOR APPROACH
Anesthesia: Spinal | Site: Hip | Laterality: Left

## 2014-10-14 MED ORDER — MOMETASONE FURO-FORMOTEROL FUM 100-5 MCG/ACT IN AERO
2.0000 | INHALATION_SPRAY | Freq: Two times a day (BID) | RESPIRATORY_TRACT | Status: DC
  Administered 2014-10-15 – 2014-10-16 (×3): 2 via RESPIRATORY_TRACT
  Filled 2014-10-14: qty 8.8

## 2014-10-14 MED ORDER — FENTANYL CITRATE 0.05 MG/ML IJ SOLN
INTRAMUSCULAR | Status: AC
Start: 2014-10-14 — End: 2014-10-14
  Filled 2014-10-14: qty 2

## 2014-10-14 MED ORDER — ALBUTEROL SULFATE HFA 108 (90 BASE) MCG/ACT IN AERS
2.0000 | INHALATION_SPRAY | Freq: Four times a day (QID) | RESPIRATORY_TRACT | Status: DC | PRN
Start: 2014-10-14 — End: 2014-10-14

## 2014-10-14 MED ORDER — MIDAZOLAM HCL 5 MG/5ML IJ SOLN
INTRAMUSCULAR | Status: DC | PRN
  Administered 2014-10-14: 2 mg via INTRAVENOUS

## 2014-10-14 MED ORDER — POTASSIUM CHLORIDE CRYS ER 10 MEQ PO TBCR
10.0000 meq | EXTENDED_RELEASE_TABLET | Freq: Every day | ORAL | Status: DC
  Administered 2014-10-15 – 2014-10-16 (×3): 10 meq via ORAL
  Filled 2014-10-14 (×3): qty 1

## 2014-10-14 MED ORDER — PROPOFOL 10 MG/ML IV BOLUS
INTRAVENOUS | Status: AC
  Filled 2014-10-14: qty 20

## 2014-10-14 MED ORDER — ONDANSETRON HCL 4 MG/2ML IJ SOLN
INTRAMUSCULAR | Status: DC | PRN
  Administered 2014-10-14: 4 mg via INTRAVENOUS

## 2014-10-14 MED ORDER — MEPERIDINE HCL 50 MG/ML IJ SOLN: 6.2500 mg | INTRAMUSCULAR | Status: DC | PRN

## 2014-10-14 MED ORDER — HYDROMORPHONE HCL 1 MG/ML IJ SOLN
INTRAMUSCULAR | Status: AC
  Filled 2014-10-14: qty 1

## 2014-10-14 MED ORDER — OXYCODONE HCL 5 MG PO TABS
5.0000 mg | ORAL_TABLET | ORAL | Status: DC | PRN
  Administered 2014-10-14: 5 mg via ORAL
  Administered 2014-10-15 (×4): 10 mg via ORAL
  Administered 2014-10-15: 5 mg via ORAL
  Administered 2014-10-15 – 2014-10-16 (×4): 10 mg via ORAL
  Filled 2014-10-14: qty 1
  Filled 2014-10-14 (×3): qty 2
  Filled 2014-10-14: qty 1
  Filled 2014-10-14 (×5): qty 2

## 2014-10-14 MED ORDER — 0.9 % SODIUM CHLORIDE (POUR BTL) OPTIME
TOPICAL | Status: DC | PRN
  Administered 2014-10-14: 1000 mL

## 2014-10-14 MED ORDER — ALBUTEROL SULFATE (2.5 MG/3ML) 0.083% IN NEBU: 2.5000 mg | INHALATION_SOLUTION | Freq: Four times a day (QID) | RESPIRATORY_TRACT | Status: DC | PRN

## 2014-10-14 MED ORDER — EPHEDRINE SULFATE 50 MG/ML IJ SOLN
INTRAMUSCULAR | Status: DC | PRN
  Administered 2014-10-14 (×2): 5 mg via INTRAVENOUS

## 2014-10-14 MED ORDER — ALUM & MAG HYDROXIDE-SIMETH 200-200-20 MG/5ML PO SUSP: 30.0000 mL | ORAL | Status: DC | PRN

## 2014-10-14 MED ORDER — CEFAZOLIN SODIUM-DEXTROSE 2-3 GM-% IV SOLR
INTRAVENOUS | Status: AC
  Filled 2014-10-14: qty 50

## 2014-10-14 MED ORDER — PANTOPRAZOLE SODIUM 40 MG PO TBEC
40.0000 mg | DELAYED_RELEASE_TABLET | Freq: Every day | ORAL | Status: DC
  Administered 2014-10-15 – 2014-10-16 (×2): 40 mg via ORAL
  Filled 2014-10-14 (×3): qty 1

## 2014-10-14 MED ORDER — METOCLOPRAMIDE HCL 5 MG/ML IJ SOLN: 5.0000 mg | Freq: Three times a day (TID) | INTRAMUSCULAR | Status: DC | PRN

## 2014-10-14 MED ORDER — VITAMIN D3 25 MCG (1000 UNIT) PO TABS
2000.0000 [IU] | ORAL_TABLET | Freq: Every day | ORAL | Status: DC
  Administered 2014-10-15 – 2014-10-16 (×2): 2000 [IU] via ORAL
  Filled 2014-10-14 (×2): qty 2

## 2014-10-14 MED ORDER — BUPIVACAINE HCL (PF) 0.5 % IJ SOLN
INTRAMUSCULAR | Status: DC | PRN
  Administered 2014-10-14: 3 mL

## 2014-10-14 MED ORDER — DIPHENHYDRAMINE HCL 12.5 MG/5ML PO ELIX
12.5000 mg | ORAL_SOLUTION | ORAL | Status: DC | PRN
  Administered 2014-10-15: 25 mg via ORAL
  Filled 2014-10-14: qty 10

## 2014-10-14 MED ORDER — FENTANYL CITRATE 0.05 MG/ML IJ SOLN
25.0000 ug | INTRAMUSCULAR | Status: DC | PRN
  Administered 2014-10-14 (×2): 50 ug via INTRAVENOUS

## 2014-10-14 MED ORDER — FENTANYL CITRATE 0.05 MG/ML IJ SOLN
50.0000 ug | INTRAMUSCULAR | Status: DC | PRN
  Administered 2014-10-14: 100 ug via INTRAVENOUS

## 2014-10-14 MED ORDER — VITAMIN D3 25 MCG (1000 UNIT) PO TABS
2000.0000 [IU] | ORAL_TABLET | Freq: Every day | ORAL | Status: DC
  Filled 2014-10-14: qty 2

## 2014-10-14 MED ORDER — DOCUSATE SODIUM 100 MG PO CAPS
100.0000 mg | ORAL_CAPSULE | Freq: Two times a day (BID) | ORAL | Status: DC
  Administered 2014-10-15 – 2014-10-16 (×3): 100 mg via ORAL

## 2014-10-14 MED ORDER — POLYETHYLENE GLYCOL 3350 17 G PO PACK: 17.0000 g | PACK | Freq: Every day | ORAL | Status: DC | PRN

## 2014-10-14 MED ORDER — ACETAMINOPHEN 650 MG RE SUPP: 650.0000 mg | Freq: Four times a day (QID) | RECTAL | Status: DC | PRN

## 2014-10-14 MED ORDER — PHENYLEPHRINE HCL 10 MG/ML IJ SOLN
INTRAMUSCULAR | Status: DC | PRN
  Administered 2014-10-14: 80 ug via INTRAVENOUS

## 2014-10-14 MED ORDER — LACTATED RINGERS IV SOLN
INTRAVENOUS | Status: DC
  Administered 2014-10-14: 1000 mL via INTRAVENOUS
  Administered 2014-10-14: 16:00:00 via INTRAVENOUS

## 2014-10-14 MED ORDER — PROPOFOL INFUSION 10 MG/ML OPTIME
INTRAVENOUS | Status: DC | PRN
  Administered 2014-10-14: 100 ug/kg/min via INTRAVENOUS

## 2014-10-14 MED ORDER — ACETAMINOPHEN 325 MG PO TABS
650.0000 mg | ORAL_TABLET | Freq: Four times a day (QID) | ORAL | Status: DC | PRN
  Administered 2014-10-15 – 2014-10-16 (×4): 650 mg via ORAL
  Filled 2014-10-14 (×4): qty 2

## 2014-10-14 MED ORDER — PROMETHAZINE HCL 25 MG/ML IJ SOLN: 6.2500 mg | INTRAMUSCULAR | Status: DC | PRN

## 2014-10-14 MED ORDER — MIDAZOLAM HCL 2 MG/2ML IJ SOLN
INTRAMUSCULAR | Status: AC
  Filled 2014-10-14: qty 2

## 2014-10-14 MED ORDER — ZOLPIDEM TARTRATE 5 MG PO TABS: 5.0000 mg | ORAL_TABLET | Freq: Every evening | ORAL | Status: DC | PRN

## 2014-10-14 MED ORDER — HYDROCHLOROTHIAZIDE 25 MG PO TABS
25.0000 mg | ORAL_TABLET | Freq: Every morning | ORAL | Status: DC
  Administered 2014-10-15 – 2014-10-16 (×2): 25 mg via ORAL
  Filled 2014-10-14 (×2): qty 1

## 2014-10-14 MED ORDER — KETOROLAC TROMETHAMINE 15 MG/ML IJ SOLN
7.5000 mg | Freq: Four times a day (QID) | INTRAMUSCULAR | Status: AC
  Administered 2014-10-14 – 2014-10-15 (×4): 7.5 mg via INTRAVENOUS
  Filled 2014-10-14 (×5): qty 1

## 2014-10-14 MED ORDER — FENTANYL CITRATE 0.05 MG/ML IJ SOLN
INTRAMUSCULAR | Status: DC | PRN
  Administered 2014-10-14 (×2): 50 ug via INTRAVENOUS

## 2014-10-14 MED ORDER — CEFAZOLIN SODIUM-DEXTROSE 2-3 GM-% IV SOLR
2.0000 g | INTRAVENOUS | Status: AC
  Administered 2014-10-14: 2 g via INTRAVENOUS

## 2014-10-14 MED ORDER — ADULT MULTIVITAMIN W/MINERALS CH
1.0000 | ORAL_TABLET | Freq: Every day | ORAL | Status: DC
  Administered 2014-10-15 – 2014-10-16 (×2): 1 via ORAL
  Filled 2014-10-14 (×2): qty 1

## 2014-10-14 MED ORDER — MONTELUKAST SODIUM 10 MG PO TABS
10.0000 mg | ORAL_TABLET | Freq: Every day | ORAL | Status: DC
  Administered 2014-10-15 – 2014-10-16 (×2): 10 mg via ORAL
  Filled 2014-10-14 (×2): qty 1

## 2014-10-14 MED ORDER — CEFAZOLIN SODIUM 1-5 GM-% IV SOLN
1.0000 g | Freq: Four times a day (QID) | INTRAVENOUS | Status: AC
  Administered 2014-10-14 – 2014-10-15 (×2): 1 g via INTRAVENOUS
  Filled 2014-10-14 (×2): qty 50

## 2014-10-14 MED ORDER — METHOCARBAMOL 1000 MG/10ML IJ SOLN
500.0000 mg | Freq: Four times a day (QID) | INTRAVENOUS | Status: DC | PRN
  Administered 2014-10-14 – 2014-10-15 (×3): 500 mg via INTRAVENOUS
  Filled 2014-10-14 (×5): qty 5

## 2014-10-14 MED ORDER — LACTATED RINGERS IV SOLN: INTRAVENOUS | Status: DC

## 2014-10-14 MED ORDER — SODIUM CHLORIDE 0.9 % IV SOLN
INTRAVENOUS | Status: DC
  Administered 2014-10-15: 04:00:00 via INTRAVENOUS

## 2014-10-14 MED ORDER — TRAMADOL HCL 50 MG PO TABS
100.0000 mg | ORAL_TABLET | Freq: Four times a day (QID) | ORAL | Status: DC | PRN
Start: 2014-10-14 — End: 2014-10-16
  Administered 2014-10-15 – 2014-10-16 (×3): 100 mg via ORAL
  Filled 2014-10-14 (×3): qty 2

## 2014-10-14 MED ORDER — METOCLOPRAMIDE HCL 10 MG PO TABS: 5.0000 mg | ORAL_TABLET | Freq: Three times a day (TID) | ORAL | Status: DC | PRN

## 2014-10-14 MED ORDER — FENTANYL CITRATE 0.05 MG/ML IJ SOLN
INTRAMUSCULAR | Status: AC
  Filled 2014-10-14: qty 2

## 2014-10-14 MED ORDER — SERTRALINE HCL 50 MG PO TABS
50.0000 mg | ORAL_TABLET | Freq: Every morning | ORAL | Status: DC
  Administered 2014-10-15 – 2014-10-16 (×2): 50 mg via ORAL
  Filled 2014-10-14 (×2): qty 1

## 2014-10-14 MED ORDER — MENTHOL 3 MG MT LOZG
1.0000 | LOZENGE | OROMUCOSAL | Status: DC | PRN
  Filled 2014-10-14: qty 9

## 2014-10-14 MED ORDER — BUPIVACAINE HCL (PF) 0.5 % IJ SOLN
INTRAMUSCULAR | Status: AC
  Filled 2014-10-14: qty 30

## 2014-10-14 MED ORDER — ONDANSETRON HCL 4 MG PO TABS: 4.0000 mg | ORAL_TABLET | Freq: Four times a day (QID) | ORAL | Status: DC | PRN

## 2014-10-14 MED ORDER — SODIUM CHLORIDE 0.9 % IR SOLN
Status: DC | PRN
  Administered 2014-10-14: 1000 mL

## 2014-10-14 MED ORDER — METHOCARBAMOL 500 MG PO TABS
500.0000 mg | ORAL_TABLET | Freq: Four times a day (QID) | ORAL | Status: DC | PRN
Start: 2014-10-14 — End: 2014-10-16
  Administered 2014-10-15 – 2014-10-16 (×4): 500 mg via ORAL
  Filled 2014-10-14 (×4): qty 1

## 2014-10-14 MED ORDER — LACTATED RINGERS IV SOLN
INTRAVENOUS | Status: DC | PRN
  Administered 2014-10-14 (×2): via INTRAVENOUS

## 2014-10-14 MED ORDER — PHENOL 1.4 % MT LIQD
1.0000 | OROMUCOSAL | Status: DC | PRN
  Filled 2014-10-14: qty 177

## 2014-10-14 MED ORDER — TRANEXAMIC ACID 100 MG/ML IV SOLN
1000.0000 mg | INTRAVENOUS | Status: AC
  Administered 2014-10-14: 1000 mg via INTRAVENOUS
  Filled 2014-10-14: qty 10

## 2014-10-14 MED ORDER — FLUTICASONE PROPIONATE 50 MCG/ACT NA SUSP
2.0000 | Freq: Every day | NASAL | Status: DC
  Administered 2014-10-15 – 2014-10-16 (×2): 2 via NASAL
  Filled 2014-10-14: qty 16

## 2014-10-14 MED ORDER — ONDANSETRON HCL 4 MG/2ML IJ SOLN
INTRAMUSCULAR | Status: AC
  Filled 2014-10-14: qty 2

## 2014-10-14 MED ORDER — LORAZEPAM 1 MG PO TABS
1.0000 mg | ORAL_TABLET | Freq: Two times a day (BID) | ORAL | Status: DC | PRN
  Administered 2014-10-14: 2 mg via ORAL
  Administered 2014-10-15: 1 mg via ORAL
  Filled 2014-10-14: qty 1
  Filled 2014-10-14: qty 2

## 2014-10-14 MED ORDER — ASPIRIN EC 325 MG PO TBEC
325.0000 mg | DELAYED_RELEASE_TABLET | Freq: Two times a day (BID) | ORAL | Status: DC
  Administered 2014-10-14 – 2014-10-16 (×5): 325 mg via ORAL
  Filled 2014-10-14 (×6): qty 1

## 2014-10-14 MED ORDER — ONDANSETRON HCL 4 MG/2ML IJ SOLN
4.0000 mg | Freq: Four times a day (QID) | INTRAMUSCULAR | Status: DC | PRN
  Administered 2014-10-14: 4 mg via INTRAVENOUS
  Filled 2014-10-14: qty 2

## 2014-10-14 MED ORDER — HYDROMORPHONE HCL 1 MG/ML IJ SOLN
1.0000 mg | INTRAMUSCULAR | Status: DC | PRN
  Administered 2014-10-14 (×2): 1 mg via INTRAVENOUS
  Filled 2014-10-14 (×2): qty 1

## 2014-10-14 MED ORDER — IRBESARTAN 150 MG PO TABS
150.0000 mg | ORAL_TABLET | Freq: Every day | ORAL | Status: DC
  Administered 2014-10-15 – 2014-10-16 (×2): 150 mg via ORAL
  Filled 2014-10-14 (×2): qty 1

## 2014-10-14 MED ORDER — PHENYLEPHRINE 40 MCG/ML (10ML) SYRINGE FOR IV PUSH (FOR BLOOD PRESSURE SUPPORT)
PREFILLED_SYRINGE | INTRAVENOUS | Status: AC
  Filled 2014-10-14: qty 10

## 2014-10-14 SURGICAL SUPPLY — 48 items
BAG ZIPLOCK 12X15 (MISCELLANEOUS) IMPLANT
BENZOIN TINCTURE PRP APPL 2/3 (GAUZE/BANDAGES/DRESSINGS) ×3 IMPLANT
BLADE SAW SGTL 18X1.27X75 (BLADE) ×2 IMPLANT
BLADE SAW SGTL 18X1.27X75MM (BLADE) ×1
CAPT HIP TOTAL 2 ×3 IMPLANT
CELLS DAT CNTRL 66122 CELL SVR (MISCELLANEOUS) ×1 IMPLANT
CLOSURE WOUND 1/2 X4 (GAUZE/BANDAGES/DRESSINGS) ×1
COVER PERINEAL POST (MISCELLANEOUS) ×3 IMPLANT
DRAPE C-ARM 42X120 X-RAY (DRAPES) ×3 IMPLANT
DRAPE STERI IOBAN 125X83 (DRAPES) ×3 IMPLANT
DRAPE U-SHAPE 47X51 STRL (DRAPES) ×9 IMPLANT
DRSG AQUACEL AG ADV 3.5X10 (GAUZE/BANDAGES/DRESSINGS) ×3 IMPLANT
DURAPREP 26ML APPLICATOR (WOUND CARE) ×3 IMPLANT
ELECT BLADE TIP CTD 4 INCH (ELECTRODE) ×3 IMPLANT
ELECT REM PT RETURN 9FT ADLT (ELECTROSURGICAL) ×3
ELECTRODE REM PT RTRN 9FT ADLT (ELECTROSURGICAL) ×1 IMPLANT
FACESHIELD WRAPAROUND (MASK) ×12 IMPLANT
GAUZE XEROFORM 1X8 LF (GAUZE/BANDAGES/DRESSINGS) IMPLANT
GLOVE BIO SURGEON STRL SZ7.5 (GLOVE) ×3 IMPLANT
GLOVE BIO SURGEON STRL SZ8 (GLOVE) ×3 IMPLANT
GLOVE BIOGEL PI IND STRL 6 (GLOVE) ×1 IMPLANT
GLOVE BIOGEL PI IND STRL 6.5 (GLOVE) ×1 IMPLANT
GLOVE BIOGEL PI IND STRL 8 (GLOVE) ×2 IMPLANT
GLOVE BIOGEL PI IND STRL 8.5 (GLOVE) ×1 IMPLANT
GLOVE BIOGEL PI INDICATOR 6 (GLOVE) ×2
GLOVE BIOGEL PI INDICATOR 6.5 (GLOVE) ×2
GLOVE BIOGEL PI INDICATOR 8 (GLOVE) ×4
GLOVE BIOGEL PI INDICATOR 8.5 (GLOVE) ×2
GLOVE ECLIPSE 8.0 STRL XLNG CF (GLOVE) ×3 IMPLANT
GLOVE SURG SS PI 6.5 STRL IVOR (GLOVE) ×3 IMPLANT
GOWN PREVENTION PLUS XLARGE (GOWN DISPOSABLE) ×3 IMPLANT
GOWN STRL REUS W/TWL XL LVL3 (GOWN DISPOSABLE) ×9 IMPLANT
HANDPIECE INTERPULSE COAX TIP (DISPOSABLE) ×2
KIT BASIN OR (CUSTOM PROCEDURE TRAY) ×3 IMPLANT
PACK TOTAL JOINT (CUSTOM PROCEDURE TRAY) ×3 IMPLANT
RTRCTR WOUND ALEXIS 18CM MED (MISCELLANEOUS) ×3
SET HNDPC FAN SPRY TIP SCT (DISPOSABLE) ×1 IMPLANT
STAPLER VISISTAT 35W (STAPLE) IMPLANT
STRIP CLOSURE SKIN 1/2X4 (GAUZE/BANDAGES/DRESSINGS) ×2 IMPLANT
SUT ETHIBOND NAB CT1 #1 30IN (SUTURE) ×3 IMPLANT
SUT MNCRL AB 4-0 PS2 18 (SUTURE) ×3 IMPLANT
SUT VIC AB 0 CT1 36 (SUTURE) ×3 IMPLANT
SUT VIC AB 1 CT1 36 (SUTURE) ×3 IMPLANT
SUT VIC AB 2-0 CT1 27 (SUTURE) ×4
SUT VIC AB 2-0 CT1 TAPERPNT 27 (SUTURE) ×2 IMPLANT
TOWEL OR 17X26 10 PK STRL BLUE (TOWEL DISPOSABLE) ×3 IMPLANT
TOWEL OR NON WOVEN STRL DISP B (DISPOSABLE) ×3 IMPLANT
TRAY FOLEY CATH 14FRSI W/METER (CATHETERS) ×3 IMPLANT

## 2014-10-14 NOTE — Transfer of Care (Signed)
Immediate Anesthesia Transfer of Care Note  Patient: Sarah Ochoa  Procedure(s) Performed: Procedure(s): LEFT TOTAL HIP ARTHROPLASTY ANTERIOR APPROACH (Left)  Patient Location: PACU  Anesthesia Type:Spinal  Level of Consciousness: awake, alert  and oriented  Airway & Oxygen Therapy: Patient Spontanous Breathing and Patient connected to face mask oxygen  Post-op Assessment: Report given to RN  Post vital signs: Reviewed and stable  Last Vitals:  Filed Vitals:   10/14/14 0938  BP: 159/94  Pulse: 60  Temp: 36.4 C  Resp: 20    Complications: No apparent anesthesia complications

## 2014-10-14 NOTE — Brief Op Note (Signed)
10/14/2014  1:40 PM  PATIENT:  Sarah Ochoa  54 y.o. female  PRE-OPERATIVE DIAGNOSIS:  avascular necrosis left hip  POST-OPERATIVE DIAGNOSIS:  avascular necrosis left hip  PROCEDURE:  Procedure(s): LEFT TOTAL HIP ARTHROPLASTY ANTERIOR APPROACH (Left)  SURGEON:  Surgeon(s) and Role:    * Mcarthur Rossetti, MD - Primary  PHYSICIAN ASSISTANT: Benita Stabile, PA-C  ANESTHESIA:   spinal  EBL:  Total I/O In: 1700 [I.V.:1700] Out: 400 [Urine:300; Blood:100]  BLOOD ADMINISTERED:none  DRAINS: none   LOCAL MEDICATIONS USED:  NONE  SPECIMEN:  No Specimen  DISPOSITION OF SPECIMEN:  N/A  COUNTS:  YES  TOURNIQUET:  * No tourniquets in log *  DICTATION: .Other Dictation: Dictation Number [id number]  PLAN OF CARE: Admit to inpatient   PATIENT DISPOSITION:  PACU - hemodynamically stable.   Delay start of Pharmacological VTE agent (>24hrs) due to surgical blood loss or risk of bleeding: no

## 2014-10-14 NOTE — Anesthesia Preprocedure Evaluation (Addendum)
Anesthesia Evaluation  Patient identified by MRN, date of birth, ID band Patient awake    Reviewed: Allergy & Precautions, NPO status , Patient's Chart, lab work & pertinent test results  Airway Mallampati: II  TM Distance: >3 FB Neck ROM: Full    Dental no notable dental hx.    Pulmonary COPDCurrent Smoker,  breath sounds clear to auscultation  Pulmonary exam normal       Cardiovascular hypertension, Pt. on medications Rhythm:Regular Rate:Normal     Neuro/Psych negative neurological ROS  negative psych ROS   GI/Hepatic negative GI ROS, Neg liver ROS,   Endo/Other  negative endocrine ROS  Renal/GU negative Renal ROS  negative genitourinary   Musculoskeletal negative musculoskeletal ROS (+)   Abdominal   Peds negative pediatric ROS (+)  Hematology negative hematology ROS (+)   Anesthesia Other Findings   Reproductive/Obstetrics negative OB ROS                           Anesthesia Physical Anesthesia Plan  ASA: III  Anesthesia Plan: Spinal   Post-op Pain Management:    Induction:   Airway Management Planned: Simple Face Mask  Additional Equipment:   Intra-op Plan:   Post-operative Plan:   Informed Consent: I have reviewed the patients History and Physical, chart, labs and discussed the procedure including the risks, benefits and alternatives for the proposed anesthesia with the patient or authorized representative who has indicated his/her understanding and acceptance.   Dental advisory given  Plan Discussed with: CRNA  Anesthesia Plan Comments:         Anesthesia Quick Evaluation

## 2014-10-14 NOTE — Progress Notes (Signed)
X-ray results noted 

## 2014-10-14 NOTE — Progress Notes (Signed)
Utilization review completed.  
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
## 2014-10-14 NOTE — Progress Notes (Signed)
Portable [facility] Pelvis and Lateral Left Hip X-rays done. 

## 2014-10-14 NOTE — Anesthesia Postprocedure Evaluation (Signed)
  Anesthesia Post-op Note  Patient: Sarah Ochoa  Procedure(s) Performed: Procedure(s) (LRB): LEFT TOTAL HIP ARTHROPLASTY ANTERIOR APPROACH (Left)  Patient Location: PACU  Anesthesia Type: General  Level of Consciousness: awake and alert   Airway and Oxygen Therapy: Patient Spontanous Breathing  Post-op Pain: mild  Post-op Assessment: Post-op Vital signs reviewed, Patient's Cardiovascular Status Stable, Respiratory Function Stable, Patent Airway and No signs of Nausea or vomiting  Last Vitals:  Filed Vitals:   10/14/14 1445  BP: 149/73  Pulse: 50  Temp:   Resp: 14    Post-op Vital Signs: stable   Complications: No apparent anesthesia complications

## 2014-10-15 LAB — BASIC METABOLIC PANEL
ANION GAP: 5 (ref 5–15)
BUN: 11 mg/dL (ref 6–23)
CHLORIDE: 108 mmol/L (ref 96–112)
CO2: 29 mmol/L (ref 19–32)
Calcium: 8.7 mg/dL (ref 8.4–10.5)
Creatinine, Ser: 0.73 mg/dL (ref 0.50–1.10)
GFR calc non Af Amer: >90 mL/min (ref >90)
GLUCOSE: 113 mg/dL — AB (ref 70–99)
POTASSIUM: 3.1 mmol/L — AB (ref 3.5–5.1)
Sodium: 142 mmol/L (ref 135–145)

## 2014-10-15 LAB — CBC
HCT: 32.3 % — ABNORMAL LOW (ref 36.0–46.0)
Hemoglobin: 10.5 g/dL — ABNORMAL LOW (ref 12.0–15.0)
MCH: 30.3 pg (ref 26.0–34.0)
MCHC: 32.5 g/dL (ref 30.0–36.0)
MCV: 93.1 fL (ref 78.0–100.0)
PLATELETS: 209 10*3/uL (ref 150–400)
RBC: 3.47 MIL/uL — ABNORMAL LOW (ref 3.87–5.11)
RDW: 15.2 % (ref 11.5–15.5)
WBC: 10.6 10*3/uL — ABNORMAL HIGH (ref 4.0–10.5)

## 2014-10-15 MED ORDER — OXYCODONE-ACETAMINOPHEN 5-325 MG PO TABS: 1.0000 | ORAL_TABLET | ORAL | Status: DC | PRN

## 2014-10-15 MED ORDER — METHOCARBAMOL 500 MG PO TABS: 500.0000 mg | ORAL_TABLET | Freq: Four times a day (QID) | ORAL | Status: DC | PRN

## 2014-10-15 MED ORDER — ASPIRIN 325 MG PO TBEC: 325.0000 mg | DELAYED_RELEASE_TABLET | Freq: Two times a day (BID) | ORAL | Status: DC

## 2014-10-15 NOTE — Progress Notes (Signed)
CARE MANAGEMENT NOTE 10/15/2014  Patient:  Sarah Ochoa, Sarah Ochoa   Account Number:  [id number]  Date Initiated:  10/15/2014  Documentation initiated by:  Dessa Phi  Subjective/Objective Assessment:   54 y/o f admitted w/AVN L hip.     Action/Plan:   From home.   Anticipated DC Date:  10/16/2014   Anticipated DC Plan:  [place]  CM consult      Choice offered to / List presented to:  C-1 Patient        [place] arranged  [place]   Status of service:  In process, will continue to follow Medicare Important Message given?   (If response is "NO", the following Medicare IM given date fields will be blank) Date Medicare IM given:   Medicare IM given by:   Date Additional Medicare IM given:   Additional Medicare IM given by:    Discharge Disposition:    Per UR Regulation:    If discussed at Long Length of Stay Meetings, dates discussed:    Comments:  10/15/14 Dessa Phi RN BSN NCM [phone] PT-HH, RW.[place] Go chosen, [employer] rep aware of HHPT order.Await home rw order.Await d/c order.

## 2014-10-15 NOTE — Progress Notes (Signed)
RN clarified what "wound care" meant with MD, and MD wanted her dressing changed because of drainage on surgical dressing. RN changed surgical dressing per MD order.

## 2014-10-15 NOTE — Plan of Care (Signed)
Problem: Phase I Progression Outcomes Goal: Dangle or out of bed evening of surgery Outcome: Not Met (add Reason) Pt unable to Dangle due to uncontrolled pain/anxiety

## 2014-10-15 NOTE — Discharge Instructions (Signed)
Increase your activities as comfort allows. You can get your actual hip dressing wet in the shower. Leave your current dressing in place until your outpatient follow-up. Do get an over the counter stool softener.

## 2014-10-15 NOTE — Progress Notes (Signed)
Foley Catheter NOT DISCONTINUED because of pts inability to move freely in bed. pt very verbal at least amount of pressure or movement.  Will share with am staff.

## 2014-10-15 NOTE — Op Note (Signed)
[place], [place] NO.:  [id number]  MEDICAL RECORD NO.:  [id number]  LOCATION:  50                         FACILITY:  [facility]  PHYSICIAN:  Lind Guest. Ninfa Linden, M.D.DATE OF BIRTH:  May 25, 1961  DATE OF PROCEDURE:  10/14/2014 DATE OF DISCHARGE:                              OPERATIVE REPORT   PREOPERATIVE DIAGNOSIS:  Left hip avascular necrosis.  POSTOPERATIVE DIAGNOSIS:  Left hip avascular necrosis.  PROCEDURE:  Left total hip arthroplasty direct anterior approach.  IMPLANTS:  DePuy Sector Gription acetabular component size 48 with apex hole eliminator and a single screw, size 32 +4 neutral polyethylene liner, size 9 Corail femoral component with varus offset (KLA), size 32+ 1 ceramic hip ball.  SURGEON:  Lind Guest. Ninfa Linden, M.D.  ASSISTING:  Erskine Emery, PA-C.  ANESTHESIA:  Spinal.  BLOOD LOSS:  About 300 mL.  COMPLICATIONS:  None.  ANTIBIOTICS:  2 g of IV Ancef.  INDICATIONS:  Ms. Desrocher is a 54 year old with bilateral hip pain and bilateral known avascular necrosis.  It looks little bit worse on the right than the left, but her left hip bothers her quite significantly more.  The right hip is now starting to bother, but at this point, she wished to proceed with a left total hip arthroplasty.  She understands our disease process subsequently under plain films, CT scan and MRI of both of her hips.  At this point, her pain is daily, her mobility is limit and her quality of life has been affected by her severe pain and to the point that she does wish to proceed with a left total hip arthroplasty.  She understands the risks of acute blood loss, anemia, nerve and vessel injury, fracture, fracture dislocation, and DVT.  She understands the goals are decreased pain, improved mobility, and overall improved quality of life.  PROCEDURE DESCRIPTION:  After informed consent was obtained, appropriate left hip was marked.  She was brought to the  operating room and spinal anesthesia was obtained while she was on her stretcher.  She was then laid supine on the stretcher.  A Foley catheter was placed and then both feet had traction boots applied to them.  Next, she was placed supine on the Hana fracture table with a perineal post in place and both legs in inline skeletal traction devices, but no traction applied.  Her left operative hip was prepped and draped with DuraPrep and sterile drapes. A time-out was called, she was identified as correct patient and correct left hip.  I then made an incision inferior and posterior to the anterior superior iliac spine and carried this obliquely down the leg. I dissected down to the tensor fascia lata muscle.  The tensor fascia was then divided longitudinally, so I could proceed with a direct anterior approach to the hip.  I identified and cauterized the lateral femoral circumflex vessels.  I placed a Cobra retractor around the lateral femoral neck and then up underneath the rectus numbers on the medial neck.  I then opened the hip capsule in a L-type format and found a large joint effusion.  I placed the Cobra retractors within the hip capsule.  I then was able to  make a femoral neck cut with an oscillating saw proximal to the lesser trochanter and completed this with an osteotome.  I placed a corkscrew guide in the femoral head and removed the femoral head in its entirety and found areas of flaking cartilage consistent with avascular necrosis.  I passed this off to the back table and then cleaned the acetabular and debris including remnants of the acetabular labrum.  I placed a bent Hohmann across the medial acetabular rim and under direct visualization, began reaming from a size 42 reamer in 2-mm increments all the way up to a 48.  A 48 had very tight fit, so we placed this under direct fluoroscopy as well so we could obtain our depth, reaming, and our inclination and anteversion.  Once I was  pleased with this, I placed the real DePuy Sector Gription acetabular component. I did place a single screw and apex hole guide.  I then placed a real 32+ 4 neutral polyethylene liner.  Attention was then turned to the femur with the leg externally rotated to 100 degrees extended and adducted.  We were able to placed a Mueller retractor medially and Hohmann retractor behind the greater trochanter.  I released the lateral joint capsule and used a box cutting osteotome to enter the femoral canal and a rongeur to lateralize.  I then began broaching for the size 8 broach using the Corail broaching system and only went up to a size 9. Due to her anatomy of varus next on both sides, I trialed a varus offset neck component and a 32+ 1 femoral head, reduced this in the acetabulum and she was stable.  Her leg lengths and offsets were measured equal and she has external rotation as well.  I then dislocated the hip and removed the trial components.  I placed the real DePuy Corail femoral component with varus offset size 9 and the real 32+ 1 ceramic hip ball, reduced this in the acetabulum and again it was stable.  I was pleased again under direct imaging of the hip.  We then copiously irrigated the soft tissue with normal saline solution.  I closed the joint capsule with interrupted #1 Ethibond suture followed by #1 Vicryl in the tensor fascia, 0 Vicryl in the deep tissue, 2-0 Vicryl in the subcutaneous tissue, 4-0 Monocryl subcuticular stitch and Steri-Strips on the skin. An Aquacel dressing was applied.  She was then taken off of the Hana table and taken to the recovery room in stable condition.  All final counts were correct.  There were no complications noted.  Of note, Erskine Emery, PA-C, assisted during the entire case and his assistance was crucial for facilitating all aspects of this case.     Lind Guest. Ninfa Linden, M.D.     CYB/MEDQ  D:  10/14/2014  T:  10/15/2014  Job:  [id number]

## 2014-10-15 NOTE — Progress Notes (Signed)
OT Cancellation Note  Patient Details Name: Sarah Ochoa MRN: [id number] DOB: 12/25/1960   Cancelled Treatment:    Reason Eval/Treat Not Completed: Other (comment)Attempted to see pt but she was just back in bed and having pain. Had recent pain meds per her report. Will reattempt to see pt next date.  [place], [place] 10/15/2014, 4:03 PM

## 2014-10-15 NOTE — Evaluation (Signed)
Physical Therapy Evaluation Patient Details Name: Sarah Ochoa MRN: [id number] DOB: [birth year] Today's Date: 10/15/2014   History of Present Illness  L DA THA  Clinical Impression  Pt will benefit from PT to address deficits below; plan is for home with HHPT, pt has 24hr assist if needed    Follow Up Recommendations Home health PT;Supervision for mobility/OOB    Equipment Recommendations  Rolling walker with 5" wheels    Recommendations for Other Services       Precautions / Restrictions Precautions Precautions: Fall Restrictions Weight Bearing Restrictions: No Other Position/Activity Restrictions: WBAT      Mobility  Bed Mobility Overal bed mobility: Needs Assistance Bed Mobility: Supine to Sit     Supine to sit: Min assist;Mod assist     General bed mobility comments: cues for technique and self assist  Transfers Overall transfer level: Needs assistance Equipment used: Rolling walker (2 wheeled) Transfers: Sit to/from Stand Sit to Stand: Min assist         General transfer comment: incr time, cues for hand placement, safety, control of descent  Ambulation/Gait Ambulation/Gait assistance: Min assist Ambulation Distance (Feet): 45 Feet Assistive device: Rolling walker (2 wheeled) Gait Pattern/deviations: Step-to pattern;Antalgic;Trunk flexed   Gait velocity interpretation: Below normal speed for age/gender General Gait Details: cues for sequence and RW safety  Stairs            Wheelchair Mobility    Modified Rankin (Stroke Patients Only)       Balance                                             Pertinent Vitals/Pain Pain Assessment: 0-10 Pain Score: 8  Pain Location: hip Pain Descriptors / Indicators: Sore Pain Intervention(s): Limited activity within patient's tolerance;Monitored during session;Repositioned;Ice applied    Home Living Family/patient expects to be discharged to:: Private residence   Available  Help at Discharge: Available PRN/intermittently Type of Home: Mobile home Home Access: Stairs to enter Entrance Stairs-Rails: Right Entrance Stairs-Number of Steps: 3 Home Layout: One level Home Equipment: Cane - single point;Bedside commode      Prior Function Level of Independence: Independent               Hand Dominance        Extremity/Trunk Assessment   Upper Extremity Assessment: Defer to OT evaluation           Lower Extremity Assessment: LLE deficits/detail         Communication   Communication: No difficulties  Cognition Arousal/Alertness: Lethargic;Suspect due to medications Behavior During Therapy: Flat affect   Area of Impairment: Problem solving;Following commands       Following Commands: Follows one step commands with increased time     Problem Solving: Slow processing;Requires verbal cues      General Comments      Exercises Total Joint Exercises Heel Slides: AAROM;10 reps;Left      Assessment/Plan    PT Assessment Patient needs continued PT services  PT Diagnosis Difficulty walking   PT Problem List Decreased strength;Decreased range of motion;Decreased activity tolerance;Decreased balance;Decreased mobility;Decreased knowledge of use of DME;Pain  PT Treatment Interventions DME instruction;Gait training;Functional mobility training;Therapeutic activities;Therapeutic exercise;Patient/family education;Stair training   PT Goals (Current goals can be found in the Care Plan section) Acute Rehab PT Goals Patient Stated Goal: less pain PT Goal Formulation: With patient  Time For Goal Achievement: 10/22/14    Frequency 7X/week   Barriers to discharge        Co-evaluation               End of Session   Activity Tolerance: Patient tolerated treatment well Patient left: in chair;with call bell/phone within reach;with family/visitor present;with nursing/sitter in room Nurse Communication: Mobility status          Time: 0865-7846 PT Time Calculation (min) (ACUTE ONLY): 25 min   Charges:   PT Evaluation $Initial PT Evaluation Tier I: 1 Procedure PT Treatments $Gait Training: 8-22 mins   PT G Codes:        Sarah Ochoa 10/17/14, 10:35 AM

## 2014-10-15 NOTE — Progress Notes (Signed)
Subjective: 1 Day Post-Op Procedure(s) (LRB): LEFT TOTAL HIP ARTHROPLASTY ANTERIOR APPROACH (Left) Patient reports pain as moderate.    Objective: Vital signs in last 24 hours: Temp:  [96.8 F (36 C)-98.5 F (36.9 C)] 98.5 F (36.9 C) (01/30 1015) Pulse Rate:  [50-75] 75 (01/30 1015) Resp:  [11-19] 16 (01/30 1015) BP: (116-156)/(65-93) 134/75 mmHg (01/30 1015) SpO2:  [98 %-100 %] 100 % (01/30 1015) FiO2 (%):  [2 %] 2 % (01/29 1700)  Intake/Output from previous day: 01/29 0701 - 01/30 0700 In: 3100 [P.O.:840; I.V.:2100; IV Piggyback:160] Out: 2475 [Urine:2375; Blood:100] Intake/Output this shift: Total I/O In: 120 [P.O.:120] Out: 400 [Urine:400]   Recent Labs  10/15/14 0440  HGB 10.5*    Recent Labs  10/15/14 0440  WBC 10.6*  RBC 3.47*  HCT 32.3*  PLT 209    Recent Labs  10/15/14 0440  NA 142  K 3.1*  CL 108  CO2 29  BUN 11  CREATININE 0.73  GLUCOSE 113*  CALCIUM 8.7   No results for input(s): LABPT, INR in the last 72 hours.  Neurologically intact  Assessment/Plan: 1 Day Post-Op Procedure(s) (LRB): LEFT TOTAL HIP ARTHROPLASTY ANTERIOR APPROACH (Left) Up with therapy likely home Sunday. Dressing change, d/c foley SL iv.   Toshua Honsinger C 10/15/2014, 1:10 PM

## 2014-10-16 LAB — CBC
HCT: 28.8 % — ABNORMAL LOW (ref 36.0–46.0)
Hemoglobin: 9.5 g/dL — ABNORMAL LOW (ref 12.0–15.0)
MCH: 30.2 pg (ref 26.0–34.0)
MCHC: 33 g/dL (ref 30.0–36.0)
MCV: 91.4 fL (ref 78.0–100.0)
PLATELETS: 191 10*3/uL (ref 150–400)
RBC: 3.15 MIL/uL — AB (ref 3.87–5.11)
RDW: 15.4 % (ref 11.5–15.5)
WBC: 10.7 10*3/uL — ABNORMAL HIGH (ref 4.0–10.5)

## 2014-10-16 NOTE — Progress Notes (Signed)
Wheelchair received. Pt. Left via wheelchair with NT and pt. family. No respiratory distress noted.

## 2014-10-16 NOTE — Clinical Social Work Note (Signed)
CSW reviewed PT evaluation which reflected HH/PT  No further CSW needs  CSW signing off  .Dede Query, LCSW [facility] Clinical Social Worker - Weekend Coverage cell #: [phone]

## 2014-10-16 NOTE — Progress Notes (Signed)
Physical Therapy Treatment Patient Details Name: Sarah Ochoa MRN: [id number] DOB: Feb 11, 1961 Today's Date: 11/07/2014    History of Present Illness L DA THA    PT Comments    Progressing well, will practice stairs later if pt is going to D/C today  Follow Up Recommendations  Home health PT;Supervision for mobility/OOB     Equipment Recommendations  Rolling walker with 5" wheels    Recommendations for Other Services       Precautions / Restrictions Precautions Precautions: Fall Restrictions Weight Bearing Restrictions: No Other Position/Activity Restrictions: WBAT    Mobility  Bed Mobility               General bed mobility comments: pt on BSC  Transfers Overall transfer level: Needs assistance Equipment used: Rolling walker (2 wheeled) Transfers: Sit to/from Stand Sit to Stand: Min guard         General transfer comment: incr time, cues for hand placement, safety, control of descent  Ambulation/Gait Ambulation/Gait assistance: Min guard;Supervision Ambulation Distance (Feet): 100 Feet Assistive device: Rolling walker (2 wheeled) Gait Pattern/deviations: Step-to pattern;Antalgic     General Gait Details: cues for sequence and RW safety   Stairs            Wheelchair Mobility    Modified Rankin (Stroke Patients Only)       Balance                                    Cognition Arousal/Alertness: Awake/alert Behavior During Therapy: Flat affect Overall Cognitive Status: Within Functional Limits for tasks assessed         Following Commands: Follows one step commands with increased time     Problem Solving: Slow processing;Requires verbal cues      Exercises Total Joint Exercises Ankle Circles/Pumps: AROM;Both;10 reps   General Comments        Pertinent Vitals/Pain Pain Assessment: 0-10 Pain Score: 6  Pain Location: L hip Pain Descriptors / Indicators: Shooting Pain Intervention(s): Limited activity  within patient's tolerance;Monitored during session;Premedicated before session;Ice applied    Home Living                      Prior Function            PT Goals (current goals can now be found in the care plan section) Acute Rehab PT Goals Patient Stated Goal: less pain PT Goal Formulation: With patient Time For Goal Achievement: 10/22/14 Progress towards PT goals: Progressing toward goals    Frequency  7X/week    PT Plan Current plan remains appropriate    Co-evaluation             End of Session Equipment Utilized During Treatment: Gait belt Activity Tolerance: Patient tolerated treatment well Patient left: in chair;with call bell/phone within reach;with family/visitor present     Time: 9163-8466 PT Time Calculation (min) (ACUTE ONLY): 21 min  Charges:  $Gait Training: 8-22 mins $Therapeutic Exercise: 8-22 mins                    G Codes:      Mechel Schutter November 07, 2014, 10:25 AM

## 2014-10-16 NOTE — Progress Notes (Signed)
Discharge teaching completed with teach back. Discharge instructions reviewed and given to pt. Pt. to call for follow up appointment on Monday with Dr. Rush Farmer. Gave prescription for Asprin, Oxycodone,  and Robaxin. Answered all questions. Pt waiting on walker before she leaves.

## 2014-10-16 NOTE — Progress Notes (Signed)
   10/16/14 1400  PT Visit Information  Last PT Received On 10/16/14  Assistance Needed +1  History of Present Illness L DA THA  PT Time Calculation  PT Start Time (ACUTE ONLY) 1412  PT Stop Time (ACUTE ONLY) 1430  PT Time Calculation (min) (ACUTE ONLY) 18 min  Subjective Data  Patient Stated Goal less pain  Precautions  Precautions Fall  Restrictions  Other Position/Activity Restrictions WBAT  Pain Assessment  Pain Assessment 0-10  Pain Score 2  Pain Location L hip  Pain Descriptors / Indicators Constant  Pain Intervention(s) Limited activity within patient's tolerance;Monitored during session;Ice applied  Cognition  Arousal/Alertness Awake/alert  Behavior During Therapy Flat affect  Overall Cognitive Status Within Functional Limits for tasks assessed  Bed Mobility  Overal bed mobility Needs Assistance  Bed Mobility Supine to Sit;Sit to Supine  Supine to sit Supervision  Sit to supine Min guard  General bed mobility comments pt self assistin LLE with RLE  Transfers  Overall transfer level Needs assistance  Equipment used Rolling walker (2 wheeled)  Transfers Sit to/from Stand  Sit to Stand Supervision  General transfer comment incr time, cues for hand placement, safety, control of descent  Ambulation/Gait  Ambulation/Gait assistance Supervision  Ambulation Distance (Feet) 80 Feet  Assistive device Rolling walker (2 wheeled)  Gait Pattern/deviations Step-to pattern;Antalgic  General Gait Details cues for sequence and RW safety  Stairs Yes  Stairs assistance Min guard  Stair Management Two rails;Step to pattern;Forwards  Number of Stairs 5  General stair comments cues for sequence  PT - End of Session  Equipment Utilized During Treatment Gait belt  Activity Tolerance Patient tolerated treatment well  Patient left in bed;with call bell/phone within reach;with family/visitor present  Nurse Communication Mobility status  PT - Assessment/Plan  PT Plan Current plan  remains appropriate  PT Frequency (ACUTE ONLY) 7X/week  Follow Up Recommendations Home health PT;Supervision for mobility/OOB  PT equipment Rolling walker with 5" wheels  PT Goal Progression  Progress towards PT goals Progressing toward goals  Acute Rehab PT Goals  PT Goal Formulation With patient  Time For Goal Achievement 10/22/14  PT General Charges  $$ ACUTE PT VISIT 1 Procedure  PT Treatments  $Gait Training 8-22 mins

## 2014-10-16 NOTE — Progress Notes (Signed)
   10/16/14 0800  PT Visit Information  Last PT Received On 10/15/14  History of Present Illness L DA THA  PT Time Calculation  PT Start Time (ACUTE ONLY) 1528  PT Stop Time (ACUTE ONLY) 1539  PT Time Calculation (min) (ACUTE ONLY) 11 min  Subjective Data  Patient Stated Goal less pain  Precautions  Precautions Fall  Restrictions  Other Position/Activity Restrictions WBAT  Pain Assessment  Pain Assessment 0-10  Pain Score 6  Pain Location l hip  Pain Descriptors / Indicators Constant  Pain Intervention(s) Limited activity within patient's tolerance;Monitored during session;Ice applied;Premedicated before session  Cognition  Arousal/Alertness Awake/alert  Behavior During Therapy Flat affect  Overall Cognitive Status Within Functional Limits for tasks assessed  Following Commands Follows one step commands with increased time  Problem Solving Slow processing;Requires verbal cues  Total Joint Exercises  Heel Slides AAROM;10 reps;Left  Ankle Circles/Pumps AROM;Both;10 reps  [employer] AROM;Strengthening;Both;10 reps  Short Arc [employer];Left;10 reps  Hip ABduction/ADduction AAROM;Left;10 reps  PT - End of Session  Activity Tolerance Patient tolerated treatment well;Patient limited by pain  Patient left in bed;with call bell/phone within reach;with family/visitor present  Nurse Communication Mobility status  PT - Assessment/Plan  PT Plan Current plan remains appropriate  PT Frequency (ACUTE ONLY) 7X/week  Follow Up Recommendations Home health PT;Supervision for mobility/OOB  PT equipment Rolling walker with 5" wheels  PT Goal Progression  Progress towards PT goals Progressing toward goals  Acute Rehab PT Goals  Time For Goal Achievement 10/22/14  PT General Charges  $$ ACUTE PT VISIT 1 Procedure  PT Treatments  $Therapeutic Exercise 8-22 mins

## 2014-10-16 NOTE — Care Management (Signed)
CARE MANAGEMENT NOTE 10/16/2014  Patient:  Sarah Ochoa, Sarah Ochoa   Account Number:  [id number]  Date Initiated:  10/15/2014  Documentation initiated by:  Dessa Phi  Subjective/Objective Assessment:   54 y/o f admitted w/AVN L hip.     Action/Plan:   From home.   Anticipated DC Date:  10/16/2014   Anticipated DC Plan:  [place]  CM consult      [facility] Choice  DURABLE MEDICAL EQUIPMENT   Choice offered to / List presented to:  C-1 Patient   DME arranged  Vassie Moselle      DME agency  [place] arranged  [place]   Status of service:  In process, will continue to follow Medicare Important Message given?   (If response is "NO", the following Medicare IM given date fields will be blank) Date Medicare IM given:   Medicare IM given by:   Date Additional Medicare IM given:   Additional Medicare IM given by:    Discharge Disposition:  [place]  Per UR Regulation:    If discussed at Long Length of Stay Meetings, dates discussed:    Comments:  10/16/14 1615 - Call from patient's nurse. Patient is ready for d/c but has not received the rolling walker. CM spoke with patient. Selected [facility] for DME. Has a bedside commode at home. Agrees to [facility] delivery of the walker today before she leaves to go home. James at [facility] notified of DME request and will deliver the walker within the hour. Venita Sheffield RN BSN CCM [phone]  10/15/14 Dessa Phi RN BSN NCM [phone] PT-HH, RW.[place] Go chosen, [facility] rep aware of HHPT order.Await home rw order.Await d/c order.

## 2014-10-16 NOTE — Evaluation (Signed)
Occupational Therapy Evaluation Patient Details Name: Sarah Ochoa MRN: [id number] DOB: 12-28-1960 Today's Date: 10/16/2014    History of Present Illness L DA THA   Clinical Impression   Pt was independent in self care prior to admission.  Requires min guard assist for ADL transfers and min assist for self care.  Pt will have 24 hour care at home to assist as needed. Pt is aware of multiple uses of 3 in 1.  Requested pt attempt tub transfer with HHPT as she is not ready to attempt.  Instructed in availability of AE. Educated in safe footwear and transporting items with RW.  No further OT needs.  Pt is eager to go home today.    Follow Up Recommendations  No OT follow up;Supervision/Assistance - 24 hour    Equipment Recommendations  None recommended by OT    Recommendations for Other Services       Precautions / Restrictions Precautions Precautions: Fall Restrictions Weight Bearing Restrictions: No Other Position/Activity Restrictions: WBAT      Mobility Bed Mobility               General bed mobility comments: pt up in chair  Transfers Overall transfer level: Needs assistance Equipment used: Rolling walker (2 wheeled) Transfers: Sit to/from Stand Sit to Stand: Min guard         General transfer comment: incr time, cues for hand placement, safety, control of descent    Balance                                            ADL Overall ADL's : Needs assistance/impaired Eating/Feeding: Independent   Grooming: Wash/dry hands;Min guard;Standing   Upper Body Bathing: Set up;Sitting   Lower Body Bathing: Minimal assistance;Sit to/from stand Lower Body Bathing Details (indicate cue type and reason): instructed in use of long sponge Upper Body Dressing : Set up;Sitting   Lower Body Dressing: Minimal assistance;Sit to/from stand Lower Body Dressing Details (indicate cue type and reason): instructed to dress operated leg first, will have  availability of family, friend to assist, instructed pt to attempt to donn sock and pants daily. Toilet Transfer: Min guard;Ambulation;Comfort height toilet;Grab bars;RW   Toileting- Clothing Manipulation and Hygiene: Supervision/safety;Sit to/from stand         General ADL Comments: Pt is aware of multiple uses of 3 in 1.     Vision                     Perception     Praxis      Pertinent Vitals/Pain Pain Assessment: 0-10 Pain Score: 8  Pain Location: L hip Pain Descriptors / Indicators: Aching Pain Intervention(s): Limited activity within patient's tolerance;Monitored during session;Repositioned;Ice applied     Hand Dominance Right   Extremity/Trunk Assessment Upper Extremity Assessment Upper Extremity Assessment: Overall WFL for tasks assessed   Lower Extremity Assessment Lower Extremity Assessment: Defer to PT evaluation       Communication Communication Communication: No difficulties   Cognition Arousal/Alertness: Awake/alert Behavior During Therapy: Flat affect Overall Cognitive Status: Within Functional Limits for tasks assessed                  General Comments       Exercises       Shoulder Instructions      Home Living Family/patient expects to be discharged to::  Private residence Living Arrangements: Children Available Help at Discharge: Family;Friend(s);Available 24 hours/day Type of Home: Mobile home Home Access: Stairs to enter Entrance Stairs-Number of Steps: 3 Entrance Stairs-Rails: Right Home Layout: One level     Bathroom Shower/Tub: Teacher, early years/pre: Standard     Home Equipment: Cane - single point;Bedside commode          Prior Functioning/Environment Level of Independence: Independent             OT Diagnosis:     OT Problem List:     OT Treatment/Interventions:      OT Goals(Current goals can be found in the care plan section) Acute Rehab OT Goals Patient Stated Goal: less  pain  OT Frequency:     Barriers to D/C:            Co-evaluation              End of Session    Activity Tolerance: Patient tolerated treatment well Patient left: in chair;with call bell/phone within reach;with family/visitor present   Time: 1050-1108 OT Time Calculation (min): 18 min Charges:  OT General Charges $OT Visit: 1 Procedure OT Evaluation $Initial OT Evaluation Tier I: 1 Procedure G-Codes:    Malka So 10/16/2014, 11:08 AM  [phone]

## 2014-10-17 ENCOUNTER — Encounter (HOSPITAL_COMMUNITY): Payer: Self-pay | Admitting: Orthopaedic Surgery

## 2014-12-01 NOTE — Discharge Summary (Signed)
Patient ID: Sarah Ochoa MRN: [id number] DOB/AGE: 1961-02-13 54 y.o.  Admit date: 10/14/2014 Discharge date: 12/01/2014  Admission Diagnoses:  Principal Problem:   Avascular necrosis of bone of left hip Active Problems:   Osteoarthritis of left hip   Status post total replacement of left hip   Discharge Diagnoses:  S/p left total hip arthroplasty   Past Medical History  Diagnosis Date  . Asthma   . COPD (chronic obstructive pulmonary disease)   . HTN (hypertension)   . Acid reflux   . Chronic knee pain   . Chronic ankle pain   . Chronic back pain   . Anxiety and depression   . Left hip pain   . Anxiety   . Depression     Surgeries: Procedure(s): LEFT TOTAL HIP ARTHROPLASTY ANTERIOR APPROACH on 10/14/2014   Consultants:    Discharged Condition: Improved  Hospital Course: Sarah Ochoa is an 54 y.o. female who was admitted 10/14/2014 for operative treatment ofAvascular necrosis of bone of left hip. Patient has severe unremitting pain that affects sleep, daily activities, and work/hobbies. After pre-op clearance the patient was taken to the operating room on 10/14/2014 and underwent  Procedure(s): LEFT TOTAL HIP ARTHROPLASTY ANTERIOR APPROACH.    Patient was given perioperative antibiotics:  Anti-infectives    Start     Dose/Rate Route Frequency Ordered Stop   10/14/14 1800  ceFAZolin (ANCEF) IVPB 1 g/50 mL premix     1 g 100 mL/hr over 30 Minutes Intravenous Every 6 hours 10/14/14 1548 10/15/14 0153   10/14/14 0947  ceFAZolin (ANCEF) IVPB 2 g/50 mL premix     2 g 100 mL/hr over 30 Minutes Intravenous On call to O.R. 10/14/14 0947 10/14/14 1226       Patient was given sequential compression devices, early ambulation, and chemoprophylaxis to prevent DVT.  Patient benefited maximally from hospital stay and there were no complications.    Recent vital signs: No data found.    Recent laboratory studies: No results for input(s): WBC, HGB, HCT, PLT, NA, K, CL, CO2,  BUN, CREATININE, GLUCOSE, INR, CALCIUM in the last 72 hours.  Invalid input(s): PT, 2   Discharge Medications:     Medication List    STOP taking these medications        ibuprofen 800 MG tablet  Commonly known as:  ADVIL,MOTRIN      TAKE these medications        albuterol 108 (90 BASE) MCG/ACT inhaler  Commonly known as:  PROVENTIL HFA;VENTOLIN HFA  Inhale 2 puffs into the lungs every 6 (six) hours as needed. Shortness of breath     albuterol (2.5 MG/3ML) 0.083% nebulizer solution  Commonly known as:  PROVENTIL  Take 2.5 mg by nebulization every 6 (six) hours as needed for wheezing or shortness of breath.     aspirin 325 MG EC tablet  Take 1 tablet (325 mg total) by mouth 2 (two) times daily after a meal.     DIOVAN 160 MG tablet  Generic drug:  valsartan  Take 160 mg by mouth every morning.     fluticasone 50 MCG/ACT nasal spray  Commonly known as:  FLONASE  Place 2 sprays into the nose daily.     fluticasone-salmeterol 115-21 MCG/ACT inhaler  Commonly known as:  ADVAIR HFA  Inhale 2 puffs into the lungs 2 (two) times daily.     hydrochlorothiazide 25 MG tablet  Commonly known as:  HYDRODIURIL  Take 25 mg by mouth every morning.  LORazepam 1 MG tablet  Commonly known as:  ATIVAN  Take 1-2 mg by mouth 2 (two) times daily as needed for anxiety.     methocarbamol 500 MG tablet  Commonly known as:  ROBAXIN  Take 1 tablet (500 mg total) by mouth every 6 (six) hours as needed for muscle spasms.     multivitamin with minerals Tabs tablet  Take 1 tablet by mouth daily.     naproxen 500 MG tablet  Commonly known as:  NAPROSYN  Take 500 mg by mouth 2 (two) times daily with a meal.     omeprazole 20 MG capsule  Commonly known as:  PRILOSEC  Take 20 mg by mouth daily.     oxyCODONE-acetaminophen 5-325 MG per tablet  Commonly known as:  ROXICET  Take 1-2 tablets by mouth every 4 (four) hours as needed.     potassium chloride 10 MEQ CR capsule  Commonly  known as:  MICRO-K  Take 10 mEq by mouth daily.     sertraline 50 MG tablet  Commonly known as:  ZOLOFT  Take 50 mg by mouth every morning.     SINGULAIR 10 MG tablet  Generic drug:  montelukast  Take 10 mg by mouth daily.     Vitamin D 2000 UNITS Caps  Take 1 capsule by mouth daily.        Diagnostic Studies: No results found.  Disposition: 06-Home-Health Care Svc        Follow-up Information    Follow up with Mcarthur Rossetti, MD In 2 weeks.   Specialty:  Orthopedic Surgery   Contact information:   [place] [address] [phone]       Follow up with [facility].   Why:  rolling walker   Contact information:   [address] [place] [address] [phone]       Follow up with [facility].   Why:  home health physical therapy   Contact information:   [place] [address] [phone]        Signed: Erskine Emery 12/01/2014, 9:54 AM

## 2014-12-29 ENCOUNTER — Other Ambulatory Visit (HOSPITAL_COMMUNITY): Payer: Self-pay | Admitting: Orthopaedic Surgery

## 2014-12-29 ENCOUNTER — Other Ambulatory Visit (HOSPITAL_COMMUNITY): Payer: Self-pay

## 2015-01-06 ENCOUNTER — Inpatient Hospital Stay (HOSPITAL_COMMUNITY): Admission: RE | Admit: 2015-01-06 | Payer: Medicare Other | Source: Ambulatory Visit

## 2015-01-06 NOTE — Progress Notes (Signed)
Faxed info to Transportation for patient's appointment on 01/11/2015 at 1030am for preop appointment.  Confirmation received and Transportation services has received fax regarding appointment.

## 2015-01-10 NOTE — Anesthesia Preprocedure Evaluation (Addendum)
Anesthesia Evaluation  Patient identified by MRN, date of birth, ID band Patient awake    Reviewed: Allergy & Precautions, NPO status , Patient's Chart, lab work & pertinent test results, reviewed documented beta blocker date and time   Airway Mallampati: II   Neck ROM: Full    Dental  (+) Teeth Intact, Dental Advisory Given   Pulmonary asthma , COPDCurrent Smoker,  breath sounds clear to auscultation        Cardiovascular hypertension, Pt. on medications Rhythm:Regular     Neuro/Psych    GI/Hepatic Neg liver ROS, GERD-  Medicated,  Endo/Other    Renal/GU negative Renal ROS     Musculoskeletal   Abdominal (+)  Abdomen: soft.    Peds  Hematology   Anesthesia Other Findings   Reproductive/Obstetrics                            Anesthesia Physical Anesthesia Plan  ASA: II  Anesthesia Plan: Spinal   Post-op Pain Management:    Induction:   Airway Management Planned: Nasal Cannula  Additional Equipment:   Intra-op Plan:   Post-operative Plan:   Informed Consent: I have reviewed the patients History and Physical, chart, labs and discussed the procedure including the risks, benefits and alternatives for the proposed anesthesia with the patient or authorized representative who has indicated his/her understanding and acceptance.     Plan Discussed with:   Anesthesia Plan Comments: (Check AM Labs)        Anesthesia Quick Evaluation

## 2015-01-11 ENCOUNTER — Encounter (HOSPITAL_COMMUNITY)
Admission: RE | Admit: 2015-01-11 | Discharge: 2015-01-11 | Disposition: A | Discharge status: Home / Self Care | Payer: Medicare Other | Source: Ambulatory Visit | Attending: Orthopaedic Surgery | Admitting: Orthopaedic Surgery

## 2015-01-11 ENCOUNTER — Encounter (HOSPITAL_COMMUNITY): Payer: Self-pay

## 2015-01-11 HISTORY — DX: Rash and other nonspecific skin eruption: R21

## 2015-01-11 HISTORY — DX: Personal history of peptic ulcer disease: Z87.11

## 2015-01-11 HISTORY — DX: Personal history of other diseases of the digestive system: Z87.19

## 2015-01-11 HISTORY — DX: Idiopathic aseptic necrosis of unspecified femur: M87.059

## 2015-01-11 LAB — BASIC METABOLIC PANEL
Anion gap: 9 (ref 5–15)
BUN: 16 mg/dL (ref 6–23)
CO2: 22 mmol/L (ref 19–32)
CREATININE: 0.78 mg/dL (ref 0.50–1.10)
Calcium: 10.1 mg/dL (ref 8.4–10.5)
Chloride: 108 mmol/L (ref 96–112)
GLUCOSE: 130 mg/dL — AB (ref 70–99)
Potassium: 3.6 mmol/L (ref 3.5–5.1)
SODIUM: 139 mmol/L (ref 135–145)

## 2015-01-11 LAB — CBC
HCT: 42.7 % (ref 36.0–46.0)
Hemoglobin: 14.2 g/dL (ref 12.0–15.0)
MCH: 29.7 pg (ref 26.0–34.0)
MCHC: 33.3 g/dL (ref 30.0–36.0)
MCV: 89.3 fL (ref 78.0–100.0)
PLATELETS: 222 10*3/uL (ref 150–400)
RBC: 4.78 MIL/uL (ref 3.87–5.11)
RDW: 15.5 % (ref 11.5–15.5)
WBC: 8 10*3/uL (ref 4.0–10.5)

## 2015-01-11 LAB — SURGICAL PCR SCREEN
MRSA, PCR: NEGATIVE
STAPHYLOCOCCUS AUREUS: NEGATIVE

## 2015-01-11 LAB — TYPE AND SCREEN
ABO/RH(D): O POS
Antibody Screen: NEGATIVE

## 2015-01-11 LAB — PROTIME-INR
INR: 1.09 (ref 0.00–1.49)
Prothrombin Time: 14.3 seconds (ref 11.6–15.2)

## 2015-01-11 LAB — APTT: aPTT: 36 seconds (ref 24–37)

## 2015-01-11 NOTE — Patient Instructions (Addendum)
YOUR PROCEDURE IS SCHEDULED ON :  01/13/15  REPORT TO [facility] MAIN ENTRANCE FOLLOW SIGNS TO SHORT STAY CENTER AT :  10:30 AM  CALL THIS NUMBER IF YOU HAVE PROBLEMS THE MORNING OF SURGERY [phone]  REMEMBER:  DO NOT EAT FOOD OR DRINK LIQUIDS AFTER MIDNIGHT (MAY HAVE WATER UNTIL 6:30 AM)  TAKE THESE MEDICINES THE MORNING OF SURGERY:  ATIVAN / PRILOSEC / ZOLOFT / SINGULAIR / TRAZODONE / USE ALBUTEROL INHALER / USED FLONASE NASAL SPRAY  YOU MAY NOT HAVE ANY METAL ON YOUR BODY INCLUDING HAIR PINS AND PIERCING'S. DO NOT WEAR JEWELRY, MAKEUP, LOTIONS, POWDERS OR PERFUMES. DO NOT WEAR NAIL POLISH. DO NOT SHAVE 48 HRS PRIOR TO SURGERY. MEN MAY SHAVE FACE AND NECK.  DO NOT [place]. [place] IS NOT RESPONSIBLE FOR VALUABLES.  CONTACTS, DENTURES OR PARTIALS MAY NOT BE WORN TO SURGERY. LEAVE SUITCASE IN CAR. CAN BE BROUGHT TO ROOM AFTER SURGERY.  PATIENTS DISCHARGED THE DAY OF SURGERY WILL NOT BE ALLOWED TO DRIVE HOME.  PLEASE READ OVER THE FOLLOWING INSTRUCTION SHEETS _________________________________________________________________________________                                          [place] - PREPARING FOR SURGERY  Before surgery, you can play an important role.  Because skin is not sterile, your skin needs to be as free of germs as possible.  You can reduce the number of germs on your skin by washing with CHG (chlorahexidine gluconate) soap before surgery.  CHG is an antiseptic cleaner which kills germs and bonds with the skin to continue killing germs even after washing. Please DO NOT use if you have an allergy to CHG or antibacterial soaps.  If your skin becomes reddened/irritated stop using the CHG and inform your nurse when you arrive at Short Stay. Do not shave (including legs and underarms) for at least 48 hours prior to the first CHG shower.  You may shave your face. Please follow these instructions carefully:   1.  Shower with  CHG Soap the night before surgery and the  morning of Surgery.   2.  If you choose to wash your hair, wash your hair first as usual with your  normal  Shampoo.   3.  After you shampoo, rinse your hair and body thoroughly to remove the  shampoo.                                         4.  Use CHG as you would any other liquid soap.  You can apply chg directly  to the skin and wash . Gently wash with scrungie or clean wascloth    5.  Apply the CHG Soap to your body ONLY FROM THE NECK DOWN.   Do not use on open                           Wound or open sores. Avoid contact with eyes, ears mouth and genitals (private parts).                        Genitals (private parts) with your normal soap.  6.  Wash thoroughly, paying special attention to the area where your surgery  will be performed.   7.  Thoroughly rinse your body with warm water from the neck down.   8.  DO NOT shower/wash with your normal soap after using and rinsing off  the CHG Soap .                9.  Pat yourself dry with a clean towel.             10.  Wear clean night clothes to bed after shower             11.  Place clean sheets on your bed the night of your first shower and do not  sleep with pets.  Day of Surgery : Do not apply any lotions/deodorants the morning of surgery.  Please wear clean clothes to the hospital/surgery center.  FAILURE TO FOLLOW THESE INSTRUCTIONS MAY RESULT IN THE CANCELLATION OF YOUR SURGERY    PATIENT SIGNATURE_________________________________  ______________________________________________________________________

## 2015-01-13 ENCOUNTER — Inpatient Hospital Stay (HOSPITAL_COMMUNITY): Payer: Medicare Other | Admitting: Anesthesiology

## 2015-01-13 ENCOUNTER — Encounter (HOSPITAL_COMMUNITY): Payer: Self-pay | Admitting: *Deleted

## 2015-01-13 ENCOUNTER — Encounter (HOSPITAL_COMMUNITY)
Admission: RE | Disposition: A | Discharge status: Home Health | Payer: Self-pay | Source: Ambulatory Visit | Attending: Orthopaedic Surgery

## 2015-01-13 ENCOUNTER — Inpatient Hospital Stay (HOSPITAL_COMMUNITY)
Admission: RE | Admit: 2015-01-13 | Discharge: 2015-01-16 | DRG: 470 | Disposition: A | Discharge status: Home Health | Payer: Medicare Other | Source: Ambulatory Visit | Attending: Orthopaedic Surgery | Admitting: Orthopaedic Surgery

## 2015-01-13 ENCOUNTER — Inpatient Hospital Stay (HOSPITAL_COMMUNITY): Payer: Medicare Other

## 2015-01-13 DIAGNOSIS — J449 Chronic obstructive pulmonary disease, unspecified: Secondary | ICD-10-CM | POA: Diagnosis present

## 2015-01-13 DIAGNOSIS — K219 Gastro-esophageal reflux disease without esophagitis: Secondary | ICD-10-CM | POA: Diagnosis present

## 2015-01-13 DIAGNOSIS — M87051 Idiopathic aseptic necrosis of right femur: Principal | ICD-10-CM

## 2015-01-13 DIAGNOSIS — M25551 Pain in right hip: Secondary | ICD-10-CM | POA: Diagnosis present

## 2015-01-13 DIAGNOSIS — Z90710 Acquired absence of both cervix and uterus: Secondary | ICD-10-CM

## 2015-01-13 DIAGNOSIS — Z01812 Encounter for preprocedural laboratory examination: Secondary | ICD-10-CM | POA: Diagnosis not present

## 2015-01-13 DIAGNOSIS — Z8711 Personal history of peptic ulcer disease: Secondary | ICD-10-CM | POA: Diagnosis not present

## 2015-01-13 DIAGNOSIS — Z96642 Presence of left artificial hip joint: Secondary | ICD-10-CM | POA: Diagnosis present

## 2015-01-13 DIAGNOSIS — F17210 Nicotine dependence, cigarettes, uncomplicated: Secondary | ICD-10-CM | POA: Diagnosis present

## 2015-01-13 DIAGNOSIS — J45909 Unspecified asthma, uncomplicated: Secondary | ICD-10-CM | POA: Diagnosis present

## 2015-01-13 DIAGNOSIS — Z96641 Presence of right artificial hip joint: Secondary | ICD-10-CM

## 2015-01-13 DIAGNOSIS — I10 Essential (primary) hypertension: Secondary | ICD-10-CM | POA: Diagnosis not present

## 2015-01-13 DIAGNOSIS — Z419 Encounter for procedure for purposes other than remedying health state, unspecified: Secondary | ICD-10-CM

## 2015-01-13 HISTORY — PX: TOTAL HIP ARTHROPLASTY: SHX124

## 2015-01-13 SURGERY — ARTHROPLASTY, HIP, TOTAL, ANTERIOR APPROACH
Anesthesia: Spinal | Laterality: Right

## 2015-01-13 MED ORDER — IRBESARTAN 150 MG PO TABS
150.0000 mg | ORAL_TABLET | Freq: Every day | ORAL | Status: DC
  Administered 2015-01-14 – 2015-01-16 (×3): 150 mg via ORAL
  Filled 2015-01-13 (×4): qty 1

## 2015-01-13 MED ORDER — PHENYLEPHRINE HCL 10 MG/ML IJ SOLN
INTRAMUSCULAR | Status: DC | PRN
  Administered 2015-01-13: 80 ug via INTRAVENOUS
  Administered 2015-01-13: 120 ug via INTRAVENOUS
  Administered 2015-01-13: 40 ug via INTRAVENOUS
  Administered 2015-01-13: 80 ug via INTRAVENOUS

## 2015-01-13 MED ORDER — BUPIVACAINE HCL (PF) 0.5 % IJ SOLN
INTRAMUSCULAR | Status: DC | PRN
  Administered 2015-01-13: 15 mL

## 2015-01-13 MED ORDER — ACETAMINOPHEN 650 MG RE SUPP: 650.0000 mg | Freq: Four times a day (QID) | RECTAL | Status: DC | PRN

## 2015-01-13 MED ORDER — HYDROMORPHONE HCL 1 MG/ML IJ SOLN
INTRAMUSCULAR | Status: AC
  Filled 2015-01-13: qty 1

## 2015-01-13 MED ORDER — MIDAZOLAM HCL 5 MG/5ML IJ SOLN
INTRAMUSCULAR | Status: DC | PRN
  Administered 2015-01-13 (×2): 2 mg via INTRAVENOUS

## 2015-01-13 MED ORDER — PROPOFOL 10 MG/ML IV BOLUS
INTRAVENOUS | Status: AC
  Filled 2015-01-13: qty 20

## 2015-01-13 MED ORDER — MENTHOL 3 MG MT LOZG: 1.0000 | LOZENGE | OROMUCOSAL | Status: DC | PRN

## 2015-01-13 MED ORDER — PROMETHAZINE HCL 25 MG/ML IJ SOLN
INTRAMUSCULAR | Status: AC
  Filled 2015-01-13: qty 1

## 2015-01-13 MED ORDER — PROPOFOL INFUSION 10 MG/ML OPTIME
INTRAVENOUS | Status: DC | PRN
  Administered 2015-01-13: 50 ug/kg/min via INTRAVENOUS

## 2015-01-13 MED ORDER — ACETAMINOPHEN 325 MG PO TABS
650.0000 mg | ORAL_TABLET | Freq: Four times a day (QID) | ORAL | Status: DC | PRN
  Administered 2015-01-15 (×2): 650 mg via ORAL
  Filled 2015-01-13 (×2): qty 2

## 2015-01-13 MED ORDER — SODIUM CHLORIDE 0.9 % IR SOLN
Status: DC | PRN
  Administered 2015-01-13: 1000 mL

## 2015-01-13 MED ORDER — SERTRALINE HCL 50 MG PO TABS
50.0000 mg | ORAL_TABLET | Freq: Every morning | ORAL | Status: DC
  Administered 2015-01-14 – 2015-01-16 (×3): 50 mg via ORAL
  Filled 2015-01-13 (×3): qty 1

## 2015-01-13 MED ORDER — POTASSIUM CHLORIDE CRYS ER 10 MEQ PO TBCR
10.0000 meq | EXTENDED_RELEASE_TABLET | Freq: Every day | ORAL | Status: DC
  Administered 2015-01-15 – 2015-01-16 (×2): 10 meq via ORAL
  Filled 2015-01-13 (×4): qty 1

## 2015-01-13 MED ORDER — DEXAMETHASONE SODIUM PHOSPHATE 10 MG/ML IJ SOLN
INTRAMUSCULAR | Status: AC
  Filled 2015-01-13: qty 1

## 2015-01-13 MED ORDER — TRAZODONE HCL 50 MG PO TABS
50.0000 mg | ORAL_TABLET | Freq: Every evening | ORAL | Status: DC | PRN
  Administered 2015-01-13: 50 mg via ORAL
  Filled 2015-01-13: qty 1

## 2015-01-13 MED ORDER — OXYCODONE HCL 5 MG PO TABS
5.0000 mg | ORAL_TABLET | ORAL | Status: DC | PRN
  Administered 2015-01-13: 10 mg via ORAL
  Administered 2015-01-13: 15 mg via ORAL
  Administered 2015-01-14: 10 mg via ORAL
  Administered 2015-01-14 (×3): 15 mg via ORAL
  Administered 2015-01-15 – 2015-01-16 (×8): 10 mg via ORAL
  Administered 2015-01-16: 15 mg via ORAL
  Filled 2015-01-13: qty 1
  Filled 2015-01-13 (×7): qty 2
  Filled 2015-01-13: qty 3
  Filled 2015-01-13 (×3): qty 2
  Filled 2015-01-13: qty 3
  Filled 2015-01-13: qty 2
  Filled 2015-01-13: qty 3
  Filled 2015-01-13: qty 2
  Filled 2015-01-13: qty 3

## 2015-01-13 MED ORDER — PHENOL 1.4 % MT LIQD: 1.0000 | OROMUCOSAL | Status: DC | PRN

## 2015-01-13 MED ORDER — ALBUTEROL SULFATE (2.5 MG/3ML) 0.083% IN NEBU: 2.5000 mg | INHALATION_SOLUTION | Freq: Four times a day (QID) | RESPIRATORY_TRACT | Status: DC | PRN

## 2015-01-13 MED ORDER — METHOCARBAMOL 500 MG PO TABS
500.0000 mg | ORAL_TABLET | Freq: Four times a day (QID) | ORAL | Status: DC | PRN
  Administered 2015-01-14 – 2015-01-16 (×5): 500 mg via ORAL
  Filled 2015-01-13 (×5): qty 1

## 2015-01-13 MED ORDER — ONDANSETRON HCL 4 MG/2ML IJ SOLN
INTRAMUSCULAR | Status: AC
  Filled 2015-01-13: qty 2

## 2015-01-13 MED ORDER — METOCLOPRAMIDE HCL 5 MG/ML IJ SOLN
5.0000 mg | Freq: Three times a day (TID) | INTRAMUSCULAR | Status: DC | PRN
  Administered 2015-01-14 (×2): 10 mg via INTRAVENOUS
  Filled 2015-01-13 (×2): qty 2

## 2015-01-13 MED ORDER — PANTOPRAZOLE SODIUM 40 MG PO TBEC
40.0000 mg | DELAYED_RELEASE_TABLET | Freq: Every day | ORAL | Status: DC
  Administered 2015-01-13 – 2015-01-16 (×4): 40 mg via ORAL
  Filled 2015-01-13 (×5): qty 1

## 2015-01-13 MED ORDER — LIDOCAINE HCL (CARDIAC) 20 MG/ML IV SOLN
INTRAVENOUS | Status: DC | PRN
  Administered 2015-01-13: 50 mg via INTRAVENOUS
  Administered 2015-01-13: 60 mg via INTRAVENOUS

## 2015-01-13 MED ORDER — ONDANSETRON HCL 4 MG/2ML IJ SOLN
INTRAMUSCULAR | Status: DC | PRN
  Administered 2015-01-13: 4 mg via INTRAVENOUS

## 2015-01-13 MED ORDER — VITAMIN D3 25 MCG (1000 UNIT) PO TABS
2000.0000 [IU] | ORAL_TABLET | Freq: Every day | ORAL | Status: DC
  Administered 2015-01-15 – 2015-01-16 (×2): 2000 [IU] via ORAL
  Filled 2015-01-13 (×3): qty 2

## 2015-01-13 MED ORDER — BUPIVACAINE HCL (PF) 0.5 % IJ SOLN
INTRAMUSCULAR | Status: AC
  Filled 2015-01-13: qty 30

## 2015-01-13 MED ORDER — DOCUSATE SODIUM 100 MG PO CAPS
100.0000 mg | ORAL_CAPSULE | Freq: Two times a day (BID) | ORAL | Status: DC
  Administered 2015-01-13 – 2015-01-16 (×4): 100 mg via ORAL

## 2015-01-13 MED ORDER — PNEUMOCOCCAL VAC POLYVALENT 25 MCG/0.5ML IJ INJ
0.5000 mL | INJECTION | INTRAMUSCULAR | Status: DC
  Filled 2015-01-13 (×2): qty 0.5

## 2015-01-13 MED ORDER — FENTANYL CITRATE (PF) 100 MCG/2ML IJ SOLN
25.0000 ug | INTRAMUSCULAR | Status: DC | PRN
  Administered 2015-01-13 (×2): 50 ug via INTRAVENOUS

## 2015-01-13 MED ORDER — PROPOFOL 10 MG/ML IV BOLUS
INTRAVENOUS | Status: DC | PRN
  Administered 2015-01-13 (×4): 20 mg via INTRAVENOUS

## 2015-01-13 MED ORDER — MONTELUKAST SODIUM 10 MG PO TABS
10.0000 mg | ORAL_TABLET | Freq: Every day | ORAL | Status: DC
  Administered 2015-01-14 – 2015-01-16 (×3): 10 mg via ORAL
  Filled 2015-01-13 (×4): qty 1

## 2015-01-13 MED ORDER — MOMETASONE FURO-FORMOTEROL FUM 100-5 MCG/ACT IN AERO
2.0000 | INHALATION_SPRAY | Freq: Two times a day (BID) | RESPIRATORY_TRACT | Status: DC
  Administered 2015-01-13 – 2015-01-16 (×6): 2 via RESPIRATORY_TRACT
  Filled 2015-01-13: qty 8.8

## 2015-01-13 MED ORDER — SODIUM CHLORIDE 0.9 % IJ SOLN
INTRAMUSCULAR | Status: AC
  Filled 2015-01-13: qty 10

## 2015-01-13 MED ORDER — PHENYLEPHRINE HCL 10 MG/ML IJ SOLN
INTRAMUSCULAR | Status: AC
  Filled 2015-01-13: qty 1

## 2015-01-13 MED ORDER — DIPHENHYDRAMINE HCL 12.5 MG/5ML PO ELIX
12.5000 mg | ORAL_SOLUTION | ORAL | Status: DC | PRN
  Administered 2015-01-15 (×2): 12.5 mg via ORAL
  Filled 2015-01-13 (×2): qty 5

## 2015-01-13 MED ORDER — HYDROCHLOROTHIAZIDE 25 MG PO TABS
25.0000 mg | ORAL_TABLET | Freq: Every morning | ORAL | Status: DC
  Administered 2015-01-15 – 2015-01-16 (×2): 25 mg via ORAL
  Filled 2015-01-13 (×3): qty 1

## 2015-01-13 MED ORDER — METOCLOPRAMIDE HCL 5 MG PO TABS
5.0000 mg | ORAL_TABLET | Freq: Three times a day (TID) | ORAL | Status: DC | PRN
  Administered 2015-01-15: 5 mg via ORAL
  Filled 2015-01-13 (×2): qty 2

## 2015-01-13 MED ORDER — ONDANSETRON HCL 4 MG/2ML IJ SOLN
4.0000 mg | Freq: Four times a day (QID) | INTRAMUSCULAR | Status: DC | PRN
  Administered 2015-01-13 – 2015-01-14 (×3): 4 mg via INTRAVENOUS
  Filled 2015-01-13 (×3): qty 2

## 2015-01-13 MED ORDER — ADULT MULTIVITAMIN W/MINERALS CH
1.0000 | ORAL_TABLET | Freq: Every day | ORAL | Status: DC
  Administered 2015-01-15 – 2015-01-16 (×2): 1 via ORAL
  Filled 2015-01-13 (×3): qty 1

## 2015-01-13 MED ORDER — PHENYLEPHRINE 40 MCG/ML (10ML) SYRINGE FOR IV PUSH (FOR BLOOD PRESSURE SUPPORT)
PREFILLED_SYRINGE | INTRAVENOUS | Status: AC
  Filled 2015-01-13: qty 10

## 2015-01-13 MED ORDER — ASPIRIN EC 325 MG PO TBEC
325.0000 mg | DELAYED_RELEASE_TABLET | Freq: Two times a day (BID) | ORAL | Status: DC
Start: 2015-01-13 — End: 2015-01-16
  Administered 2015-01-13 – 2015-01-16 (×6): 325 mg via ORAL
  Filled 2015-01-13 (×8): qty 1

## 2015-01-13 MED ORDER — PHENYLEPHRINE HCL 10 MG/ML IJ SOLN
10.0000 mg | INTRAVENOUS | Status: DC | PRN
  Administered 2015-01-13: 20 ug/min via INTRAVENOUS

## 2015-01-13 MED ORDER — MIDAZOLAM HCL 2 MG/2ML IJ SOLN
INTRAMUSCULAR | Status: AC
  Filled 2015-01-13: qty 2

## 2015-01-13 MED ORDER — HYDROMORPHONE HCL 1 MG/ML IJ SOLN
1.0000 mg | INTRAMUSCULAR | Status: DC | PRN
  Administered 2015-01-13 – 2015-01-14 (×6): 1 mg via INTRAVENOUS
  Filled 2015-01-13 (×5): qty 1

## 2015-01-13 MED ORDER — SODIUM CHLORIDE 0.9 % IV SOLN
INTRAVENOUS | Status: DC
  Administered 2015-01-13 – 2015-01-14 (×3): via INTRAVENOUS

## 2015-01-13 MED ORDER — LORAZEPAM 1 MG PO TABS
1.0000 mg | ORAL_TABLET | Freq: Two times a day (BID) | ORAL | Status: DC | PRN
  Administered 2015-01-13 – 2015-01-14 (×2): 2 mg via ORAL
  Filled 2015-01-13 (×2): qty 2
  Filled 2015-01-13: qty 1

## 2015-01-13 MED ORDER — FENTANYL CITRATE (PF) 100 MCG/2ML IJ SOLN
INTRAMUSCULAR | Status: AC
  Filled 2015-01-13: qty 2

## 2015-01-13 MED ORDER — MEPERIDINE HCL 50 MG/ML IJ SOLN: 6.2500 mg | INTRAMUSCULAR | Status: DC | PRN

## 2015-01-13 MED ORDER — PROMETHAZINE HCL 25 MG/ML IJ SOLN
6.2500 mg | INTRAMUSCULAR | Status: DC | PRN
  Administered 2015-01-13: 12.5 mg via INTRAVENOUS

## 2015-01-13 MED ORDER — CEFAZOLIN SODIUM-DEXTROSE 2-3 GM-% IV SOLR
INTRAVENOUS | Status: AC
  Filled 2015-01-13: qty 50

## 2015-01-13 MED ORDER — METHOCARBAMOL 1000 MG/10ML IJ SOLN
500.0000 mg | Freq: Four times a day (QID) | INTRAVENOUS | Status: DC | PRN
  Administered 2015-01-13 (×2): 500 mg via INTRAVENOUS
  Filled 2015-01-13 (×5): qty 5

## 2015-01-13 MED ORDER — DEXAMETHASONE SODIUM PHOSPHATE 10 MG/ML IJ SOLN
INTRAMUSCULAR | Status: DC | PRN
  Administered 2015-01-13: 10 mg via INTRAVENOUS

## 2015-01-13 MED ORDER — PHENYLEPHRINE HCL 10 MG/ML IJ SOLN: 30.0000 ug/min | INTRAVENOUS | Status: DC

## 2015-01-13 MED ORDER — ALBUTEROL SULFATE HFA 108 (90 BASE) MCG/ACT IN AERS: 2.0000 | INHALATION_SPRAY | Freq: Four times a day (QID) | RESPIRATORY_TRACT | Status: DC | PRN

## 2015-01-13 MED ORDER — ONDANSETRON HCL 4 MG PO TABS
4.0000 mg | ORAL_TABLET | Freq: Four times a day (QID) | ORAL | Status: DC | PRN
  Administered 2015-01-15: 4 mg via ORAL
  Filled 2015-01-13: qty 1

## 2015-01-13 MED ORDER — ALUM & MAG HYDROXIDE-SIMETH 200-200-20 MG/5ML PO SUSP
30.0000 mL | ORAL | Status: DC | PRN
Start: 2015-01-13 — End: 2015-01-16

## 2015-01-13 MED ORDER — FLUTICASONE PROPIONATE 50 MCG/ACT NA SUSP
2.0000 | Freq: Every day | NASAL | Status: DC
  Administered 2015-01-16: 2 via NASAL
  Filled 2015-01-13: qty 16

## 2015-01-13 MED ORDER — CEFAZOLIN SODIUM-DEXTROSE 2-3 GM-% IV SOLR
2.0000 g | INTRAVENOUS | Status: AC
  Administered 2015-01-13: 2 g via INTRAVENOUS

## 2015-01-13 MED ORDER — LACTATED RINGERS IV SOLN
INTRAVENOUS | Status: DC
  Administered 2015-01-13 (×2): via INTRAVENOUS
  Administered 2015-01-13: 1000 mL via INTRAVENOUS

## 2015-01-13 MED ORDER — TRANEXAMIC ACID 1000 MG/10ML IV SOLN
1000.0000 mg | INTRAVENOUS | Status: AC
  Administered 2015-01-13: 1000 mg via INTRAVENOUS
  Filled 2015-01-13: qty 10

## 2015-01-13 MED ORDER — FENTANYL CITRATE (PF) 100 MCG/2ML IJ SOLN
INTRAMUSCULAR | Status: DC | PRN
  Administered 2015-01-13: 100 ug via INTRAVENOUS

## 2015-01-13 SURGICAL SUPPLY — 41 items
BAG ZIPLOCK 12X15 (MISCELLANEOUS) IMPLANT
BENZOIN TINCTURE PRP APPL 2/3 (GAUZE/BANDAGES/DRESSINGS) ×3 IMPLANT
BLADE SAW SGTL 18X1.27X75 (BLADE) ×2 IMPLANT
BLADE SAW SGTL 18X1.27X75MM (BLADE) ×1
CAPT HIP TOTAL 2 ×3 IMPLANT
CELLS DAT CNTRL 66122 CELL SVR (MISCELLANEOUS) ×1 IMPLANT
CLOSURE WOUND 1/2 X4 (GAUZE/BANDAGES/DRESSINGS) ×1
COVER PERINEAL POST (MISCELLANEOUS) ×3 IMPLANT
DRAPE C-ARM 42X120 X-RAY (DRAPES) ×3 IMPLANT
DRAPE STERI IOBAN 125X83 (DRAPES) ×3 IMPLANT
DRAPE U-SHAPE 47X51 STRL (DRAPES) ×9 IMPLANT
DRSG AQUACEL AG ADV 3.5X10 (GAUZE/BANDAGES/DRESSINGS) ×3 IMPLANT
DURAPREP 26ML APPLICATOR (WOUND CARE) ×3 IMPLANT
ELECT BLADE TIP CTD 4 INCH (ELECTRODE) ×3 IMPLANT
ELECT REM PT RETURN 9FT ADLT (ELECTROSURGICAL) ×3
ELECTRODE REM PT RTRN 9FT ADLT (ELECTROSURGICAL) ×1 IMPLANT
FACESHIELD WRAPAROUND (MASK) ×12 IMPLANT
GAUZE XEROFORM 1X8 LF (GAUZE/BANDAGES/DRESSINGS) IMPLANT
GLOVE BIO SURGEON STRL SZ7.5 (GLOVE) ×3 IMPLANT
GLOVE BIOGEL PI IND STRL 8 (GLOVE) ×2 IMPLANT
GLOVE BIOGEL PI INDICATOR 8 (GLOVE) ×4
GLOVE ECLIPSE 8.0 STRL XLNG CF (GLOVE) ×3 IMPLANT
GOWN STRL REUS W/TWL XL LVL3 (GOWN DISPOSABLE) ×6 IMPLANT
HANDPIECE INTERPULSE COAX TIP (DISPOSABLE) ×2
KIT BASIN OR (CUSTOM PROCEDURE TRAY) ×3 IMPLANT
PACK TOTAL JOINT (CUSTOM PROCEDURE TRAY) ×3 IMPLANT
PEN SKIN MARKING BROAD (MISCELLANEOUS) ×3 IMPLANT
RTRCTR WOUND ALEXIS 18CM MED (MISCELLANEOUS) ×3
SET HNDPC FAN SPRY TIP SCT (DISPOSABLE) ×1 IMPLANT
STAPLER VISISTAT 35W (STAPLE) IMPLANT
STRIP CLOSURE SKIN 1/2X4 (GAUZE/BANDAGES/DRESSINGS) ×2 IMPLANT
SUT ETHIBOND NAB CT1 #1 30IN (SUTURE) ×3 IMPLANT
SUT MNCRL AB 4-0 PS2 18 (SUTURE) ×3 IMPLANT
SUT VIC AB 0 CT1 36 (SUTURE) ×3 IMPLANT
SUT VIC AB 1 CT1 36 (SUTURE) ×3 IMPLANT
SUT VIC AB 2-0 CT1 27 (SUTURE) ×4
SUT VIC AB 2-0 CT1 TAPERPNT 27 (SUTURE) ×2 IMPLANT
TOWEL OR 17X26 10 PK STRL BLUE (TOWEL DISPOSABLE) ×3 IMPLANT
TOWEL OR NON WOVEN STRL DISP B (DISPOSABLE) ×3 IMPLANT
TRAY FOLEY W/METER SILVER 14FR (SET/KITS/TRAYS/PACK) ×3 IMPLANT
YANKAUER SUCT BULB TIP 10FT TU (MISCELLANEOUS) ×3 IMPLANT

## 2015-01-13 NOTE — Transfer of Care (Addendum)
Immediate Anesthesia Transfer of Care Note  Patient: Sarah Ochoa  Procedure(s) Performed: Procedure(s): RIGHT TOTAL HIP ARTHROPLASTY ANTERIOR APPROACH (Right)  Patient Location: PACU  Anesthesia Type:Spinal  Level of Consciousness:  sedated, patient cooperative and responds to stimulation  Airway & Oxygen Therapy:Patient Spontanous Breathing and Patient connected to face mask oxgen  Post-op Assessment:  Report given to PACU RN and Post -op Vital signs reviewed and stable  Post vital signs:  Reviewed and stable  Last Vitals:  Filed Vitals:   01/13/15 0809  BP: 118/87  Pulse: 78  Temp: 36.4 C  Resp: 18    Complications: No apparent anesthesia complications, M03

## 2015-01-13 NOTE — Anesthesia Procedure Notes (Signed)
Spinal Patient location during procedure: OR End time: 01/13/2015 12:38 PM Staffing Resident/CRNA: Noralyn Pick D Performed by: anesthesiologist and resident/CRNA  Preanesthetic Checklist Completed: patient identified, site marked, surgical consent, pre-op evaluation, timeout performed, IV checked, risks and benefits discussed and monitors and equipment checked Spinal Block Patient position: sitting Prep: Betadine Patient monitoring: heart rate, continuous pulse ox and blood pressure Approach: midline Location: L2-3 Injection technique: single-shot Needle Needle type: Sprotte  Needle gauge: 24 G Needle length: 9 cm Assessment Sensory level: T6 Additional Notes Expiration date of kit checked and confirmed. Patient tolerated procedure well, without complications.

## 2015-01-13 NOTE — Progress Notes (Signed)
Utilization review completed.  

## 2015-01-13 NOTE — Anesthesia Postprocedure Evaluation (Signed)
  Anesthesia Post-op Note  Patient: Sarah Ochoa  Procedure(s) Performed: Procedure(s): RIGHT TOTAL HIP ARTHROPLASTY ANTERIOR APPROACH (Right)  Patient Location: PACU  Anesthesia Type:Spinal  Level of Consciousness: awake and alert   Airway and Oxygen Therapy: Patient Spontanous Breathing  Post-op Pain: mild  Post-op Assessment: Post-op Vital signs reviewed and Patient's Cardiovascular Status Stable  Post-op Vital Signs: Reviewed and stable  Last Vitals:  Filed Vitals:   01/13/15 1408  BP:   Pulse: 45  Temp: 36.4 C  Resp: 14    Complications: No apparent anesthesia complications
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
## 2015-01-13 NOTE — Brief Op Note (Signed)
01/13/2015  1:45 PM  PATIENT:  Sarah Ochoa  54 y.o. female  PRE-OPERATIVE DIAGNOSIS:  Avascular necrosis right hip  POST-OPERATIVE DIAGNOSIS:  same PROCEDURE:  Procedure(s): RIGHT TOTAL HIP ARTHROPLASTY ANTERIOR APPROACH (Right)  SURGEON:  Surgeon(s) and Role:    * Mcarthur Rossetti, MD - Primary  PHYSICIAN ASSISTANT: Benita Stabile, PA-C  ANESTHESIA:   spinal  EBL:  Total I/O In: 1000 [I.V.:1000] Out: 100 [Blood:100]  BLOOD ADMINISTERED:none  DRAINS: none   LOCAL MEDICATIONS USED:  NONE  SPECIMEN:  No Specimen  DISPOSITION OF SPECIMEN:  N/A  COUNTS:  YES  TOURNIQUET:  * No tourniquets in log *  DICTATION: .Other Dictation: Dictation Number [id number]  PLAN OF CARE: Admit to inpatient   PATIENT DISPOSITION:  PACU - hemodynamically stable.   Delay start of Pharmacological VTE agent (>24hrs) due to surgical blood loss or risk of bleeding: no

## 2015-01-14 LAB — BASIC METABOLIC PANEL
ANION GAP: 8 (ref 5–15)
BUN: 14 mg/dL (ref 6–23)
CO2: 26 mmol/L (ref 19–32)
CREATININE: 0.72 mg/dL (ref 0.50–1.10)
Calcium: 9.3 mg/dL (ref 8.4–10.5)
Chloride: 109 mmol/L (ref 96–112)
GFR calc Af Amer: >90 mL/min (ref >90)
GFR calc non Af Amer: >90 mL/min (ref >90)
Glucose, Bld: 145 mg/dL — ABNORMAL HIGH (ref 70–99)
POTASSIUM: 4 mmol/L (ref 3.5–5.1)
Sodium: 143 mmol/L (ref 135–145)

## 2015-01-14 LAB — CBC
HEMATOCRIT: 36.2 % (ref 36.0–46.0)
Hemoglobin: 11.6 g/dL — ABNORMAL LOW (ref 12.0–15.0)
MCH: 29.6 pg (ref 26.0–34.0)
MCHC: 32 g/dL (ref 30.0–36.0)
MCV: 92.3 fL (ref 78.0–100.0)
Platelets: 190 10*3/uL (ref 150–400)
RBC: 3.92 MIL/uL (ref 3.87–5.11)
RDW: 15.7 % — AB (ref 11.5–15.5)
WBC: 13.9 10*3/uL — ABNORMAL HIGH (ref 4.0–10.5)

## 2015-01-14 MED ORDER — METHOCARBAMOL 500 MG PO TABS: 500.0000 mg | ORAL_TABLET | Freq: Four times a day (QID) | ORAL | Status: DC | PRN

## 2015-01-14 MED ORDER — OXYCODONE-ACETAMINOPHEN 5-325 MG PO TABS: 1.0000 | ORAL_TABLET | ORAL | Status: DC | PRN

## 2015-01-14 NOTE — Progress Notes (Signed)
OT Cancellation Note  Patient Details Name: Sarah Ochoa MRN: [id number] DOB: September 24, 1960   Cancelled Treatment:    Reason Eval/Treat Not Completed: Other (comment).  Pt has been limited by pain and nausea/vomiting this am.  Will check back later today or tomorrow for OT needs.  Pt had other hip done in January of this year.  Correne Lalani 01/14/2015, 12:50 PM  Lesle Chris, OTR/L [phone] 01/14/2015

## 2015-01-14 NOTE — Evaluation (Signed)
Physical Therapy Evaluation Patient Details Name: Sarah Ochoa MRN: [id number] DOB: 1961-02-27 Today's Date: 01/14/2015   History of Present Illness  Pt s/p R  direct anterior THA.  Clinical Impression  Pt admitted with above diagnosis. Pt currently with functional limitations due to the deficits listed below (see PT Problem List).  Pt will benefit from skilled PT to increase their independence and safety with mobility to allow discharge to the venue listed below.  Pt feeling better in afternoon and able to work with PT and ambulate 76' with RW and min/guard.  Recommend HHPT.  She has all the DME at home already from previous hip surgery.     Follow Up Recommendations Home health PT    Equipment Recommendations  None recommended by PT    Recommendations for Other Services       Precautions / Restrictions Precautions Precautions: None Restrictions Weight Bearing Restrictions: Yes RLE Weight Bearing: Weight bearing as tolerated      Mobility  Bed Mobility Overal bed mobility: Needs Assistance Bed Mobility: Supine to Sit     Supine to sit: Supervision;HOB elevated        Transfers Overall transfer level: Needs assistance Equipment used: Rolling walker (2 wheeled) Transfers: Sit to/from Stand Sit to Stand: Min guard         General transfer comment: cues for safety  Ambulation/Gait Ambulation/Gait assistance: Min guard Ambulation Distance (Feet): 70 Feet Assistive device: Rolling walker (2 wheeled) Gait Pattern/deviations: Antalgic;Step-to pattern        Stairs            Wheelchair Mobility    Modified Rankin (Stroke Patients Only)       Balance Overall balance assessment: Needs assistance           Standing balance-Leahy Scale: Poor Standing balance comment: Requires RW                              Pertinent Vitals/Pain Pain Assessment: 0-10 Pain Score: 9  Pain Location: R hip Pain Descriptors / Indicators: Operative  site guarding Pain Intervention(s): Monitored during session;Ice applied    Home Living Family/patient expects to be discharged to:: Private residence Living Arrangements: Children;Other relatives Available Help at Discharge: Family;Friend(s);Available 24 hours/day Type of Home: House Home Access: Stairs to enter Entrance Stairs-Rails: Right;Left;Can reach both Entrance Stairs-Number of Steps: 4 Home Layout: One level Home Equipment: Walker - 2 wheels;Bedside commode;Shower seat;Cane - single point      Prior Function Level of Independence: Independent               Hand Dominance   Dominant Hand: Right    Extremity/Trunk Assessment   Upper Extremity Assessment: Overall WFL for tasks assessed           Lower Extremity Assessment: Overall WFL for tasks assessed;RLE deficits/detail RLE Deficits / Details: R hip flex 60 degrees       Communication   Communication: No difficulties  Cognition Arousal/Alertness: Awake/alert Behavior During Therapy: WFL for tasks assessed/performed Overall Cognitive Status: Within Functional Limits for tasks assessed                      General Comments      Exercises Total Joint Exercises Ankle Circles/Pumps: AROM;Both;10 reps Quad Sets: Strengthening;Right;10 reps;Supine Short Arc Quad: Supine;Strengthening;Right;10 reps Heel Slides: AROM;Right;10 reps Hip ABduction/ADduction: AAROM;Right;10 reps      Assessment/Plan    PT Assessment Patient  needs continued PT services  PT Diagnosis Difficulty walking   PT Problem List Decreased strength;Decreased range of motion;Decreased balance;Decreased mobility  PT Treatment Interventions Gait training;Functional mobility training;Therapeutic activities;Therapeutic exercise;Stair training   PT Goals (Current goals can be found in the Care Plan section) Acute Rehab PT Goals Patient Stated Goal: Go home Monday PT Goal Formulation: With patient Time For Goal Achievement:  01/21/15 Potential to Achieve Goals: Good    Frequency 7X/week   Barriers to discharge        Co-evaluation               End of Session Equipment Utilized During Treatment: Gait belt Activity Tolerance: Patient tolerated treatment well Patient left: in chair;with family/visitor present;with call bell/phone within reach Nurse Communication: Mobility status         Time: 1610-9604 PT Time Calculation (min) (ACUTE ONLY): 35 min   Charges:   PT Evaluation $Initial PT Evaluation Tier I: 1 Procedure PT Treatments $Gait Training: 8-22 mins   PT G Codes:        Beronica Lansdale LUBECK 01/14/2015, 4:19 PM

## 2015-01-14 NOTE — Op Note (Signed)
Sarah Ochoa, Sarah Ochoa NO.:  [id number]  MEDICAL RECORD NO.:  [id number]  LOCATION:                                 FACILITY:  PHYSICIAN:  Lind Guest. Ninfa Linden, M.D.DATE OF BIRTH:  1960-11-16  DATE OF PROCEDURE:  01/13/2015 DATE OF DISCHARGE:                              OPERATIVE REPORT   PREOPERATIVE DIAGNOSIS:  Avascular necrosis, right hip.  POSTOPERATIVE DIAGNOSIS:  Avascular necrosis, right hip.  PROCEDURE:  Right total hip arthroplasty through direct anterior approach.  IMPLANTS:  DePuy Sector Gription acetabular component size 48 with a single screw and apex hole eliminator, size 32+ 4 neutral polyethylene liner, size 9 Corail femoral component with varus offset (KLA), size 32+ 1 ceramic hip ball.  SURGEON:  Lind Guest. Ninfa Linden, M.D.  ASSISTANT:  Erskine Emery, PA-C.  ANESTHESIA:  Spinal.  BLOOD LOSS:  100 mL.  ANTIBIOTICS:  2 g IV Ancef.  COMPLICATIONS:  None.  INDICATIONS:  Sarah Ochoa is a 54 year old female with bilateral hip avascular necrosis.  Two months ago, she underwent successful left total hip arthroplasty and now presents for a right total hip arthroplasty. It was done with L4 through direct anterior approach and she wishes to have this again, having a good recovery.  She has profound right hip pain and MRI evidence of avascular necrosis affecting the femoral head and the acetabulum.  She understands well the risks of acute blood loss anemia, nerve and vessel injury, fracture, infection, dislocation and DVT.  She understands the goals are decreased pain, improved mobility, and overall improved quality of life.  PROCEDURE DESCRIPTION:  After informed consent was obtained, appropriate right hip was marked.  She was brought to the operating room.  Spinal anesthesia was obtained while she was on her stretcher.  She was then laid in the supine position.  A Foley catheter was placed and both feet had traction boots applied to  the neck.  She was placed supine on the Hana fracture table with the perineal post in place and both legs in in- line skeletal traction devices, but no traction applied.  Her right operative hip was prepped and draped with DuraPrep and sterile drapes. A time-out was called, she was identified as correct patient and correct right hip.  I then made incision inferior and posterior to the anterior superior iliac spine and carried this obliquely down the leg.  I dissected down to the tensor fascia lata muscle and the tensor fascia was then divided longitudinally, so I could proceed with a direct anterior approach to the hip.  I identified and cauterized the lateral femoral circumflex vessels and then identified the hip capsule.  I opened up the hip capsule in a L-type format, placing the Cobra retractor around the lateral neck and up underneath the rectus femoris and the hip capsule on the medial side.  I then used an oscillating saw to make my femoral neck cut proximal to the lesser trochanter and completed this on osteotome.  I placed a corkscrew guide in the femoral head and removed the femoral head in its entirety.  I then placed a bent Hohmann of the medial acetabular rim and cleaned the acetabular remnants  of acetabular labrum and debris from inside, and then began reaming under direct visualization from a size 42 reamer in 2 mm increments all the way up to a size 48 which was still rather large.  I was able to medialize this under direct fluoroscopy as well as obtained my depth of reaming, my inclination and anteversion.  Once I was pleased with this, I placed the real DePuy Sector Gription acetabular component size 48 and apex hole eliminator, and a single screw.  I then turned my attention to the femur.  With the leg externally rotated to 100 degrees extended and adducted, I was able to place a Mueller retractor medially and a Hohmann retractor behind the greater trochanter.  I did  short of my neck cut with an oscillating saw and then used a rongeur to lateralize and the box cutting osteotome to enter the femoral canal.  I trialed 8 broach and then a 9 broach was corresponded to her other side.  We trialed a varus offset femoral neck and a 32+ 1 hip ball.  We brought the leg back over and up with traction and internal rotation, reduced in the pelvis. Her leg lengths and offsets were measured near equal and she was stable through that arc of motion.  I then dislocated the hip and removed the trial components.  I placed the real Corail femoral component size 9 with varus offset and the real 32+ 1 ceramic hip ball and reduced this in the pelvis and again it was stable.  We then irrigated the soft tissue with normal saline solution and closed the joint capsule with interrupted #1 Ethibond suture followed by running #1 Vicryl in the tensor fascia, 0 Vicryl in the deep tissue, 2-0 Vicryl in subcutaneous tissue, 4-0 Monocryl in subcuticular stitch, and Steri-Strips on the skin.  An Aquacel dressing was applied.  She was then taken off the Hana table and taken to the recovery room in stable condition.  All final counts were correct and there were no complications noted.  Of note, Erskine Emery, PA-C, assisted during the entire case for open and closure and his assistance was crucial for facilitating all aspects of this case.     Lind Guest. Ninfa Linden, M.D.     CYB/MEDQ  D:  01/13/2015  T:  01/14/2015  Job:  [id number]

## 2015-01-14 NOTE — Discharge Instructions (Signed)

## 2015-01-14 NOTE — Progress Notes (Signed)
Subjective: 1 Day Post-Op Procedure(s) (LRB): RIGHT TOTAL HIP ARTHROPLASTY ANTERIOR APPROACH (Right) Patient reports pain as moderate.    Objective: Vital signs in last 24 hours: Temp:  [97.4 F (36.3 C)-98.3 F (36.8 C)] 97.9 F (36.6 C) (04/30 1013) Pulse Rate:  [43-68] 67 (04/30 1013) Resp:  [12-18] 16 (04/30 1013) BP: (90-133)/(55-85) 123/76 mmHg (04/30 1013) SpO2:  [97 %-100 %] 97 % (04/30 1013) FiO2 (%):  [21 %] 21 % (04/30 0905)  Intake/Output from previous day: 04/29 0701 - 04/30 0700 In: 5096.3 [P.O.:960; I.V.:4086.3; IV Piggyback:50] Out: 2850 [Urine:2750; Blood:100] Intake/Output this shift: Total I/O In: -  Out: 600 [Urine:600]   Recent Labs  01/14/15 0450  HGB 11.6*    Recent Labs  01/14/15 0450  WBC 13.9*  RBC 3.92  HCT 36.2  PLT 190    Recent Labs  01/14/15 0450  NA 143  K 4.0  CL 109  CO2 26  BUN 14  CREATININE 0.72  GLUCOSE 145*  CALCIUM 9.3   No results for input(s): LABPT, INR in the last 72 hours.  Neurologically intact  Assessment/Plan: 1 Day Post-Op Procedure(s) (LRB): RIGHT TOTAL HIP ARTHROPLASTY ANTERIOR APPROACH (Right) Up with therapy  Got nauseated and vomited, has not done therapy yet.    Tahra Hitzeman C 01/14/2015, 12:29 PM

## 2015-01-14 NOTE — Care Management (Signed)
CARE MANAGEMENT NOTE 01/14/2015  Patient:  LELAR, FAREWELL   Account Number:  [id number]  Date Initiated:  01/14/2015  Documentation initiated by:  Apolonio Schneiders  Subjective/Objective Assessment:   Right total hip arthroplasty     Action/Plan:   Anticipated DC Date:  01/15/2015   Anticipated DC Plan:  [place]  CM consult      [facility] Choice  HOME HEALTH   Choice offered to / List presented to:          [facility] arranged  HH-2 PT      [place]   Status of service:  Completed, signed off Medicare Important Message given?   (If response is "NO", the following Medicare IM given date fields will be blank) Date Medicare IM given:   Medicare IM given by:   Date Additional Medicare IM given:   Additional Medicare IM given by:    Discharge Disposition:    Per UR Regulation:    If discussed at Long Length of Stay Meetings, dates discussed:    Comments:  01/14/15 0945 - CM spoke with patient at the bedside. Selected [facility] from home health services. She has a 3N1 and rolling walker at home. Daughter and cousin will assist her at home. Venita Sheffield RN BSN CCM [phone]

## 2015-01-14 NOTE — Progress Notes (Signed)
PT Cancellation Note  Patient Details Name: Sarah Ochoa MRN: [id number] DOB: 09/20/1960   Cancelled Treatment:    Reason Eval/Treat Not Completed: Medical issues which prohibited therapy. Attempted PT evaluation twice this AM, but due to pain and active vomiting was unable to complete.  Will check back in afternoon.   Freddy Spadafora LUBECK 01/14/2015, 11:32 AM

## 2015-01-15 LAB — CBC
HCT: 30.2 % — ABNORMAL LOW (ref 36.0–46.0)
HEMOGLOBIN: 9.5 g/dL — AB (ref 12.0–15.0)
MCH: 29.1 pg (ref 26.0–34.0)
MCHC: 31.5 g/dL (ref 30.0–36.0)
MCV: 92.4 fL (ref 78.0–100.0)
Platelets: 160 10*3/uL (ref 150–400)
RBC: 3.27 MIL/uL — AB (ref 3.87–5.11)
RDW: 15.6 % — ABNORMAL HIGH (ref 11.5–15.5)
WBC: 10.6 10*3/uL — ABNORMAL HIGH (ref 4.0–10.5)

## 2015-01-15 MED ORDER — POLYETHYLENE GLYCOL 3350 17 G PO PACK: 17.0000 g | PACK | Freq: Two times a day (BID) | ORAL | Status: DC | PRN

## 2015-01-15 MED ORDER — POLYETHYLENE GLYCOL 3350 17 GM/SCOOP PO POWD
1.0000 | Freq: Two times a day (BID) | ORAL | Status: DC | PRN
  Administered 2015-01-15: 255 g via ORAL
  Filled 2015-01-15: qty 255

## 2015-01-15 NOTE — Evaluation (Signed)
Occupational Therapy Evaluation Patient Details Name: JKAYLA MCCALLON MRN: [id number] DOB: 1961/08/16 Today's Date: 01/15/2015    History of Present Illness Pt s/p R  direct anterior THA.   Clinical Impression   Pt was seen for initial evaluation.  All education was completed:  Safety cues given during evaluation.  Pt verbalizes understanding.    Follow Up Recommendations  No OT follow up    Equipment Recommendations  None recommended by OT    Recommendations for Other Services       Precautions / Restrictions Precautions Precautions: Fall Restrictions Weight Bearing Restrictions: No RLE Weight Bearing: Weight bearing as tolerated      Mobility Bed Mobility         Supine to sit: Supervision;HOB elevated     General bed mobility comments: stand by assistance for safety:  pt crossed LLE under R to support  Transfers   Equipment used: Rolling walker (2 wheeled) Transfers: Sit to/from [employer] Sit to Stand: Supervision Stand pivot transfers: Supervision       General transfer comment: cues for safety    Balance                                            ADL Overall ADL's : Needs assistance/impaired             Lower Body Bathing: Minimal assistance;Sit to/from stand           Toilet Transfer: Min guard;BSC;Stand-pivot   Toileting- Architect and Hygiene: Supervision/safety;Sit to/from stand         General ADL Comments: Pt needed to use bathroom when OT arrived.  She performed SPT due to pain.  She recently had L hip replacement, has assistance, and help at home.  Pt wanted to bathe after using commode.  Cued pt for safety a few times:  she attempted to stand without walker, pulled up on walker and did not remove gown prior to standing and this fell to floor creating a trip hazard.  Reviewed tub readiness--she has a seat in her tub.     Vision     Perception     Praxis      Pertinent  Vitals/Pain Pain Score: 8  Pain Location: R hip Pain Descriptors / Indicators: Aching;Sharp Pain Intervention(s): Limited activity within patient's tolerance;Monitored during session;Repositioned;RN gave pain meds during session     Hand Dominance     Extremity/Trunk Assessment Upper Extremity Assessment Upper Extremity Assessment: Overall WFL for tasks assessed           Communication Communication Communication: No difficulties   Cognition Arousal/Alertness: Awake/alert Behavior During Therapy: WFL for tasks assessed/performed Overall Cognitive Status: Within Functional Limits for tasks assessed                     General Comments       Exercises       Shoulder Instructions      Home Living Family/patient expects to be discharged to:: Private residence Living Arrangements: Children;Other relatives                 Bathroom Shower/Tub: Chief Strategy Officer: Standard     Home Equipment: Environmental consultant - 2 wheels;Bedside commode;Shower seat;Cane - single point          Prior Functioning/Environment Level of Independence: Independent  OT Diagnosis: Acute pain   OT Problem List:     OT Treatment/Interventions:      OT Goals(Current goals can be found in the care plan section) Acute Rehab OT Goals Patient Stated Goal: Go home Monday  OT Frequency:     Barriers to D/C:            Co-evaluation              End of Session    Activity Tolerance: No increased pain Patient left: in bed;with call bell/phone within reach   Time: 0805-0827 OT Time Calculation (min): 22 min Charges:  OT General Charges $OT Visit: 1 Procedure OT Evaluation $Initial OT Evaluation Tier I: 1 Procedure G-Codes:    Dillie Burandt 2015/01/29, 9:52 AM Marica Otter, OTR/L [phone] 2015-01-29

## 2015-01-15 NOTE — Plan of Care (Signed)
Problem: Phase III Progression Outcomes Goal: Anticoagulant follow-up in place Outcome: Not Applicable Date Met:  50/93/26 asa

## 2015-01-15 NOTE — Progress Notes (Signed)
Subjective: 2 Days Post-Op Procedure(s) (LRB): RIGHT TOTAL HIP ARTHROPLASTY ANTERIOR APPROACH (Right) Patient reports pain as 7 on 0-10 scale.    Objective: Vital signs in last 24 hours: Temp:  [97.9 F (36.6 C)-99.3 F (37.4 C)] 98.1 F (36.7 C) (05/01 0501) Pulse Rate:  [67-95] 76 (05/01 0501) Resp:  [16-20] 18 (05/01 0501) BP: (115-143)/(69-87) 143/87 mmHg (05/01 0501) SpO2:  [96 %-100 %] 100 % (05/01 0855)  Intake/Output from previous day: 04/30 0701 - 05/01 0700 In: 1685 [I.V.:1685] Out: 2950 [Urine:2950] Intake/Output this shift: Total I/O In: -  Out: 350 [Urine:350]   Recent Labs  01/14/15 0450 01/15/15 0449  HGB 11.6* 9.5*    Recent Labs  01/14/15 0450 01/15/15 0449  WBC 13.9* 10.6*  RBC 3.92 3.27*  HCT 36.2 30.2*  PLT 190 160    Recent Labs  01/14/15 0450  NA 143  K 4.0  CL 109  CO2 26  BUN 14  CREATININE 0.72  GLUCOSE 145*  CALCIUM 9.3   No results for input(s): LABPT, INR in the last 72 hours.  Neurologically intact  Assessment/Plan: 2 Days Post-Op Procedure(s) (LRB): RIGHT TOTAL HIP ARTHROPLASTY ANTERIOR APPROACH (Right) Up with therapy moving slow. Poor pain tolerance but making steady therapy progress.   Rodnisha Blomgren C 01/15/2015, 9:13 AM

## 2015-01-15 NOTE — Progress Notes (Signed)
Physical Therapy Treatment Patient Details Name: JYRAH BLYE MRN: [id number] DOB: 1961/05/23 Today's Date: 01/15/2015    History of Present Illness Pt s/p R  direct anterior THA.    PT Comments    POD # 2 am session.  Assisted pt OOB to amb to BR then in hallway.  Pt required increased time.  Positioned in recliner and performed AP and knee presses followed by ICE.  Tx session shortened by family visit.    Follow Up Recommendations  Home health PT     Equipment Recommendations  None recommended by PT    Recommendations for Other Services       Precautions / Restrictions Precautions Precautions: Fall Restrictions Weight Bearing Restrictions: No RLE Weight Bearing: Weight bearing as tolerated    Mobility  Bed Mobility Overal bed mobility: Needs Assistance Bed Mobility: Supine to Sit     Supine to sit: Supervision;Min guard     General bed mobility comments: pt crosses legs to self assist.  Required increased time esp to scoot to EOB.  Transfers Overall transfer level: Needs assistance Equipment used: Rolling walker (2 wheeled) Transfers: Sit to/from Stand Sit to Stand: Supervision         General transfer comment: cues for safety  Ambulation/Gait Ambulation/Gait assistance: Min guard Ambulation Distance (Feet): 55 Feet Assistive device: Rolling walker (2 wheeled) Gait Pattern/deviations: Step-to pattern;Decreased stance time - right Gait velocity: decreased   General Gait Details: 25% VC's on safety with turns and amb to BR then in hallway.     Stairs            Wheelchair Mobility    Modified Rankin (Stroke Patients Only)       Balance                                    Cognition Arousal/Alertness: Awake/alert Behavior During Therapy: WFL for tasks assessed/performed Overall Cognitive Status: Within Functional Limits for tasks assessed                      Exercises  10 reps B LE AP and knee presses.     General Comments        Pertinent Vitals/Pain Pain Assessment: 0-10 Pain Score: 8  Pain Location: R hip Pain Descriptors / Indicators: Discomfort;Sore Pain Intervention(s): Monitored during session;Repositioned;Ice applied    Home Living                      Prior Function            PT Goals (current goals can now be found in the care plan section) Progress towards PT goals: Progressing toward goals    Frequency  7X/week    PT Plan      Co-evaluation             End of Session Equipment Utilized During Treatment: Gait belt Activity Tolerance: Patient tolerated treatment well Patient left: in chair;with family/visitor present;with call bell/phone within reach     Time: 6712-4580 PT Time Calculation (min) (ACUTE ONLY): 19 min  Charges:  $Gait Training: 8-22 mins                    G Codes:      Rica Koyanagi  PTA [facility]  Acute  Rehab Pager      [phone]

## 2015-01-16 ENCOUNTER — Encounter (HOSPITAL_COMMUNITY): Payer: Self-pay | Admitting: Orthopaedic Surgery

## 2015-01-16 LAB — CBC
HCT: 31.7 % — ABNORMAL LOW (ref 36.0–46.0)
Hemoglobin: 10.1 g/dL — ABNORMAL LOW (ref 12.0–15.0)
MCH: 28.9 pg (ref 26.0–34.0)
MCHC: 31.9 g/dL (ref 30.0–36.0)
MCV: 90.8 fL (ref 78.0–100.0)
Platelets: 173 10*3/uL (ref 150–400)
RBC: 3.49 MIL/uL — ABNORMAL LOW (ref 3.87–5.11)
RDW: 15.4 % (ref 11.5–15.5)
WBC: 11.4 10*3/uL — ABNORMAL HIGH (ref 4.0–10.5)

## 2015-01-16 MED ORDER — HYDROMORPHONE HCL 2 MG PO TABS: 2.0000 mg | ORAL_TABLET | ORAL | Status: DC | PRN

## 2015-01-16 NOTE — Discharge Summary (Signed)
Patient ID: Sarah Ochoa MRN: [id number] DOB/AGE: [birth year] 54 y.o.  Admit date: 01/13/2015 Discharge date: 01/16/2015  Admission Diagnoses:  Principal Problem:   Avascular necrosis of bone of right hip Active Problems:   Status post total replacement of right hip   Discharge Diagnoses:  Same  Past Medical History  Diagnosis Date  . Asthma   . COPD (chronic obstructive pulmonary disease)   . HTN (hypertension)   . Acid reflux   . Chronic knee pain   . Chronic ankle pain   . Chronic back pain   . Anxiety and depression   . Left hip pain   . Anxiety   . Depression   . Avascular necrosis of hip     RIGHT  . Rash     RT UPPER ARM  . History of stomach ulcers     Surgeries: Procedure(s): RIGHT TOTAL HIP ARTHROPLASTY ANTERIOR APPROACH on 01/13/2015   Consultants:    Discharged Condition: Improved  Hospital Course: Sarah Ochoa is an 54 y.o. female who was admitted 01/13/2015 for operative treatment ofAvascular necrosis of bone of right hip. Patient has severe unremitting pain that affects sleep, daily activities, and work/hobbies. After pre-op clearance the patient was taken to the operating room on 01/13/2015 and underwent  Procedure(s): RIGHT TOTAL HIP ARTHROPLASTY ANTERIOR APPROACH.    Patient was given perioperative antibiotics: Anti-infectives    Start     Dose/Rate Route Frequency Ordered Stop   01/13/15 1106  ceFAZolin (ANCEF) IVPB 2 g/50 mL premix     2 g 100 mL/hr over 30 Minutes Intravenous On call to O.R. 01/13/15 1106 01/13/15 1238       Patient was given sequential compression devices, early ambulation, and chemoprophylaxis to prevent DVT.  Patient benefited maximally from hospital stay and there were no complications.    Recent vital signs: Patient Vitals for the past 24 hrs:  BP Temp Temp src Pulse Resp SpO2  01/16/15 0512 118/89 mmHg 98.8 F (37.1 C) Oral 85 16 100 %  01/15/15 2034 (!) 143/81 mmHg 98.4 F (36.9 C) Oral 86 16 98 %  01/15/15  1441 121/69 mmHg 98.7 F (37.1 C) Oral 89 18 100 %  01/15/15 0855 - - - - - 100 %     Recent laboratory studies:  Recent Labs  01/14/15 0450 01/15/15 0449 01/16/15 0457  WBC 13.9* 10.6* 11.4*  HGB 11.6* 9.5* 10.1*  HCT 36.2 30.2* 31.7*  PLT 190 160 173  NA 143  --   --   K 4.0  --   --   CL 109  --   --   CO2 26  --   --   BUN 14  --   --   CREATININE 0.72  --   --   GLUCOSE 145*  --   --   CALCIUM 9.3  --   --      Discharge Medications:     Medication List    STOP taking these medications        naproxen 500 MG tablet  Commonly known as:  NAPROSYN      TAKE these medications        albuterol 108 (90 BASE) MCG/ACT inhaler  Commonly known as:  PROVENTIL HFA;VENTOLIN HFA  Inhale 2 puffs into the lungs every 6 (six) hours as needed. Shortness of breath     albuterol (2.5 MG/3ML) 0.083% nebulizer solution  Commonly known as:  PROVENTIL  Take 2.5 mg by nebulization every  6 (six) hours as needed for wheezing or shortness of breath.     aspirin 325 MG EC tablet  Take 1 tablet (325 mg total) by mouth 2 (two) times daily after a meal.     DIOVAN 160 MG tablet  Generic drug:  valsartan  Take 160 mg by mouth every morning.     fluticasone 50 MCG/ACT nasal spray  Commonly known as:  FLONASE  Place 2 sprays into the nose daily.     fluticasone-salmeterol 115-21 MCG/ACT inhaler  Commonly known as:  ADVAIR HFA  Inhale 2 puffs into the lungs 2 (two) times daily.     hydrochlorothiazide 25 MG tablet  Commonly known as:  HYDRODIURIL  Take 25 mg by mouth every morning.     HYDROmorphone 2 MG tablet  Commonly known as:  DILAUDID  Take 1 tablet (2 mg total) by mouth every 4 (four) hours as needed for severe pain (Take these until out, then switch to Percocet).     LORazepam 1 MG tablet  Commonly known as:  ATIVAN  Take 1-2 mg by mouth 2 (two) times daily as needed for anxiety.     methocarbamol 500 MG tablet  Commonly known as:  ROBAXIN  Take 1 tablet (500 mg  total) by mouth every 6 (six) hours as needed for muscle spasms.     multivitamin with minerals Tabs tablet  Take 1 tablet by mouth daily.     omeprazole 20 MG capsule  Commonly known as:  PRILOSEC  Take 20 mg by mouth daily.     oxyCODONE-acetaminophen 5-325 MG per tablet  Commonly known as:  ROXICET  Take 1-2 tablets by mouth every 4 (four) hours as needed.     potassium chloride 10 MEQ CR capsule  Commonly known as:  MICRO-K  Take 10 mEq by mouth daily.     sertraline 50 MG tablet  Commonly known as:  ZOLOFT  Take 50 mg by mouth every morning.     SINGULAIR 10 MG tablet  Generic drug:  montelukast  Take 10 mg by mouth daily.     traZODone 50 MG tablet  Commonly known as:  DESYREL  Take 50 mg by mouth at bedtime.     Vitamin D 2000 UNITS Caps  Take 1 capsule by mouth daily.        Diagnostic Studies: Dg C-arm 1-60 Min-no Report  01/13/2015   CLINICAL DATA:  Right total hip arthroplasty  EXAM: DG C-ARM 1-60 MIN - NRPT [facility]; OPERATIVE RIGHT HIP WITH PELVIS; RIGHT HIP (WITH PELVIS) 1 VIEW PORTABLE  COMPARISON:  No recent preoperative imaging for comparison  FINDINGS: Two intraoperative images demonstrate evidence of right total hip arthroplasty. No fracture line is visualized. No evidence for hardware failure. Left total hip arthroplasty partly visualized.  IMPRESSION: Expected intraoperative imaging appearance after right total hip arthroplasty.   Electronically Signed   By: Conchita Paris M.D.   On: 01/13/2015 14:21   Dg Hip Port Unilat With Pelvis 1v Right  01/13/2015   CLINICAL DATA:  Right total hip arthroplasty  EXAM: DG C-ARM 1-60 MIN - NRPT [facility]; OPERATIVE RIGHT HIP WITH PELVIS; RIGHT HIP (WITH PELVIS) 1 VIEW PORTABLE  COMPARISON:  No recent preoperative imaging for comparison  FINDINGS: Two intraoperative images demonstrate evidence of right total hip arthroplasty. No fracture line is visualized. No evidence for hardware failure. Left total hip arthroplasty partly  visualized.  IMPRESSION: Expected intraoperative imaging appearance after right total hip arthroplasty.   Electronically  Signed   By: Conchita Paris M.D.   On: 01/13/2015 14:21   Dg Hip Operative Unilat With Pelvis Right  01/13/2015   CLINICAL DATA:  Right total hip arthroplasty  EXAM: DG C-ARM 1-60 MIN - NRPT [facility]; OPERATIVE RIGHT HIP WITH PELVIS; RIGHT HIP (WITH PELVIS) 1 VIEW PORTABLE  COMPARISON:  No recent preoperative imaging for comparison  FINDINGS: Two intraoperative images demonstrate evidence of right total hip arthroplasty. No fracture line is visualized. No evidence for hardware failure. Left total hip arthroplasty partly visualized.  IMPRESSION: Expected intraoperative imaging appearance after right total hip arthroplasty.   Electronically Signed   By: Conchita Paris M.D.   On: 01/13/2015 14:21    Disposition: 06-Home-Health Care Svc      Discharge Instructions    Discharge patient    Complete by:  As directed            Follow-up Information    Follow up with [facility].   Why:  home health physical therapy   Contact information:   [address] [place] [id number] [phone]        Signed: Mcarthur Rossetti 01/16/2015, 7:43 AM

## 2015-01-16 NOTE — Progress Notes (Signed)
Subjective: 3 Days Post-Op Procedure(s) (LRB): RIGHT TOTAL HIP ARTHROPLASTY ANTERIOR APPROACH (Right) Patient reports pain as moderate.  Very tearful, but I re-assured her that she is doing great.  Objective: Vital signs in last 24 hours: Temp:  [98.4 F (36.9 C)-98.8 F (37.1 C)] 98.8 F (37.1 C) (05/02 0512) Pulse Rate:  [85-89] 85 (05/02 0512) Resp:  [16-18] 16 (05/02 0512) BP: (118-143)/(69-89) 118/89 mmHg (05/02 0512) SpO2:  [98 %-100 %] 100 % (05/02 0512)  Intake/Output from previous day: 05/01 0701 - 05/02 0700 In: 1200 [P.O.:1200] Out: 4050 [Urine:4050] Intake/Output this shift:     Recent Labs  01/14/15 0450 01/15/15 0449 01/16/15 0457  HGB 11.6* 9.5* 10.1*    Recent Labs  01/15/15 0449 01/16/15 0457  WBC 10.6* 11.4*  RBC 3.27* 3.49*  HCT 30.2* 31.7*  PLT 160 173    Recent Labs  01/14/15 0450  NA 143  K 4.0  CL 109  CO2 26  BUN 14  CREATININE 0.72  GLUCOSE 145*  CALCIUM 9.3   No results for input(s): LABPT, INR in the last 72 hours.  Sensation intact distally Intact pulses distally Dorsiflexion/Plantar flexion intact Incision: dressing C/D/I No cellulitis present Compartment soft  Assessment/Plan: 3 Days Post-Op Procedure(s) (LRB): RIGHT TOTAL HIP ARTHROPLASTY ANTERIOR APPROACH (Right) Up with therapy Discharge home with home health today.  BLACKMAN,CHRISTOPHER Y 01/16/2015, 7:40 AM

## 2015-01-16 NOTE — Progress Notes (Signed)
Physical Therapy Treatment Patient Details Name: Sarah Ochoa MRN: [id number] DOB: 1961/07/21 Today's Date: 01/16/2015    History of Present Illness Pt s/p R  direct anterior THA.    PT Comments    POD # 3 assisted OOB to amb in hallway, practiced stairs then returned to room to perfrom THr TE's followed by ICE.  Follow Up Recommendations  Home health PT     Equipment Recommendations  None recommended by PT    Recommendations for Other Services       Precautions / Restrictions Precautions Precautions: Fall Restrictions Weight Bearing Restrictions: No RLE Weight Bearing: Weight bearing as tolerated    Mobility  Bed Mobility Overal bed mobility: Needs Assistance Bed Mobility: Supine to Sit     Supine to sit: Supervision     General bed mobility comments: pt crosses legs to self assist.  Required increased time esp to scoot to EOB.  Transfers Overall transfer level: Needs assistance Equipment used: Rolling walker (2 wheeled) Transfers: Sit to/from Stand Sit to Stand: Supervision         General transfer comment: cues for safety  Ambulation/Gait Ambulation/Gait assistance: Min guard Ambulation Distance (Feet): 62 Feet   Gait Pattern/deviations: Step-to pattern Gait velocity: decreased   General Gait Details: 25% VC's on safety with turns and amb to BR then in hallway.     Stairs Stairs: Yes Stairs assistance: Min guard Stair Management: Two rails;Forwards;Step to pattern Number of Stairs: 4 General stair comments: 25% VC's on proprt tech  Wheelchair Mobility    Modified Rankin (Stroke Patients Only)       Balance                                    Cognition                            Exercises   Total Hip Replacement TE's 10 reps ankle pumps 10 reps knee presses 10 reps heel slides 10 reps SAQ's 10 reps ABD Followed by ICE     General Comments        Pertinent Vitals/Pain Pain Assessment: 0-10 Pain  Score: 6  Pain Location: R hip Pain Descriptors / Indicators: Discomfort;Sore Pain Intervention(s): Monitored during session;Premedicated before session;Repositioned;Ice applied    Home Living                      Prior Function            PT Goals (current goals can now be found in the care plan section) Progress towards PT goals: Progressing toward goals    Frequency       PT Plan      Co-evaluation             End of Session Equipment Utilized During Treatment: Gait belt Activity Tolerance: Patient tolerated treatment well Patient left: in chair;with family/visitor present;with call bell/phone within reach     Time: 0955-1025 PT Time Calculation (min) (ACUTE ONLY): 30 min  Charges:  $Gait Training: 8-22 mins $Therapeutic Exercise: 8-22 mins                    G Codes:      Rica Koyanagi  PTA [facility]  Acute  Rehab Pager      [phone]

## 2015-03-31 ENCOUNTER — Emergency Department (HOSPITAL_COMMUNITY)
Admission: EM | Admit: 2015-03-31 | Discharge: 2015-03-31 | Disposition: A | Discharge status: Home / Self Care | Payer: Medicare Other | Attending: Emergency Medicine | Admitting: Emergency Medicine

## 2015-03-31 ENCOUNTER — Encounter (HOSPITAL_COMMUNITY): Payer: Self-pay | Admitting: *Deleted

## 2015-03-31 DIAGNOSIS — Z720 Tobacco use: Secondary | ICD-10-CM | POA: Diagnosis not present

## 2015-03-31 DIAGNOSIS — K219 Gastro-esophageal reflux disease without esophagitis: Secondary | ICD-10-CM | POA: Diagnosis not present

## 2015-03-31 DIAGNOSIS — J449 Chronic obstructive pulmonary disease, unspecified: Secondary | ICD-10-CM | POA: Diagnosis not present

## 2015-03-31 DIAGNOSIS — H9202 Otalgia, left ear: Secondary | ICD-10-CM | POA: Diagnosis present

## 2015-03-31 DIAGNOSIS — Z8739 Personal history of other diseases of the musculoskeletal system and connective tissue: Secondary | ICD-10-CM | POA: Insufficient documentation

## 2015-03-31 DIAGNOSIS — F419 Anxiety disorder, unspecified: Secondary | ICD-10-CM | POA: Diagnosis not present

## 2015-03-31 DIAGNOSIS — G8929 Other chronic pain: Secondary | ICD-10-CM | POA: Insufficient documentation

## 2015-03-31 DIAGNOSIS — F329 Major depressive disorder, single episode, unspecified: Secondary | ICD-10-CM | POA: Insufficient documentation

## 2015-03-31 DIAGNOSIS — I10 Essential (primary) hypertension: Secondary | ICD-10-CM | POA: Diagnosis not present

## 2015-03-31 DIAGNOSIS — Z7982 Long term (current) use of aspirin: Secondary | ICD-10-CM | POA: Diagnosis not present

## 2015-03-31 DIAGNOSIS — H6692 Otitis media, unspecified, left ear: Secondary | ICD-10-CM

## 2015-03-31 DIAGNOSIS — Z7952 Long term (current) use of systemic steroids: Secondary | ICD-10-CM | POA: Diagnosis not present

## 2015-03-31 DIAGNOSIS — J029 Acute pharyngitis, unspecified: Secondary | ICD-10-CM | POA: Diagnosis not present

## 2015-03-31 DIAGNOSIS — Z7951 Long term (current) use of inhaled steroids: Secondary | ICD-10-CM | POA: Insufficient documentation

## 2015-03-31 DIAGNOSIS — Z79899 Other long term (current) drug therapy: Secondary | ICD-10-CM | POA: Diagnosis not present

## 2015-03-31 LAB — RAPID STREP SCREEN (MED CTR MEBANE ONLY): STREPTOCOCCUS, GROUP A SCREEN (DIRECT): NEGATIVE

## 2015-03-31 MED ORDER — PREDNISONE 10 MG PO TABS: 20.0000 mg | ORAL_TABLET | Freq: Every day | ORAL | Status: DC

## 2015-03-31 MED ORDER — AMOXICILLIN-POT CLAVULANATE 875-125 MG PO TABS
1.0000 | ORAL_TABLET | Freq: Once | ORAL | Status: AC
  Administered 2015-03-31: 1 via ORAL
  Filled 2015-03-31: qty 1

## 2015-03-31 MED ORDER — ACETAMINOPHEN 500 MG PO TABS
1000.0000 mg | ORAL_TABLET | Freq: Once | ORAL | Status: AC
  Administered 2015-03-31: 1000 mg via ORAL
  Filled 2015-03-31: qty 2

## 2015-03-31 MED ORDER — PREDNISONE 50 MG PO TABS
60.0000 mg | ORAL_TABLET | Freq: Once | ORAL | Status: AC
  Administered 2015-03-31: 60 mg via ORAL
  Filled 2015-03-31 (×2): qty 1

## 2015-03-31 MED ORDER — AMOXICILLIN-POT CLAVULANATE 875-125 MG PO TABS: 1.0000 | ORAL_TABLET | Freq: Two times a day (BID) | ORAL | Status: DC

## 2015-03-31 NOTE — Discharge Instructions (Signed)
Increase fluids. Stop smoking.  Gargle with salt water. Prescriptions for prednisone and antibiotic. Follow-up your doctor.
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
## 2015-03-31 NOTE — ED Notes (Signed)
Sore throat x 1 week. NAD.

## 2015-04-03 LAB — CULTURE, GROUP A STREP

## 2015-05-08 ENCOUNTER — Emergency Department (HOSPITAL_COMMUNITY)
Admission: EM | Admit: 2015-05-08 | Discharge: 2015-05-08 | Disposition: A | Discharge status: Home / Self Care | Payer: Medicare Other | Attending: Emergency Medicine | Admitting: Emergency Medicine

## 2015-05-08 ENCOUNTER — Encounter (HOSPITAL_COMMUNITY): Payer: Self-pay | Admitting: *Deleted

## 2015-05-08 ENCOUNTER — Emergency Department (HOSPITAL_COMMUNITY): Payer: Medicare Other

## 2015-05-08 DIAGNOSIS — Z8719 Personal history of other diseases of the digestive system: Secondary | ICD-10-CM | POA: Diagnosis not present

## 2015-05-08 DIAGNOSIS — I10 Essential (primary) hypertension: Secondary | ICD-10-CM | POA: Diagnosis not present

## 2015-05-08 DIAGNOSIS — G8929 Other chronic pain: Secondary | ICD-10-CM | POA: Insufficient documentation

## 2015-05-08 DIAGNOSIS — M25552 Pain in left hip: Secondary | ICD-10-CM | POA: Insufficient documentation

## 2015-05-08 DIAGNOSIS — Z720 Tobacco use: Secondary | ICD-10-CM | POA: Diagnosis not present

## 2015-05-08 DIAGNOSIS — Z79899 Other long term (current) drug therapy: Secondary | ICD-10-CM | POA: Insufficient documentation

## 2015-05-08 DIAGNOSIS — Z7951 Long term (current) use of inhaled steroids: Secondary | ICD-10-CM | POA: Insufficient documentation

## 2015-05-08 DIAGNOSIS — J449 Chronic obstructive pulmonary disease, unspecified: Secondary | ICD-10-CM | POA: Diagnosis not present

## 2015-05-08 DIAGNOSIS — Z7982 Long term (current) use of aspirin: Secondary | ICD-10-CM | POA: Diagnosis not present

## 2015-05-08 DIAGNOSIS — F329 Major depressive disorder, single episode, unspecified: Secondary | ICD-10-CM | POA: Diagnosis not present

## 2015-05-08 MED ORDER — KETOROLAC TROMETHAMINE 60 MG/2ML IM SOLN
60.0000 mg | Freq: Once | INTRAMUSCULAR | Status: AC
  Administered 2015-05-08: 60 mg via INTRAMUSCULAR
  Filled 2015-05-08: qty 2

## 2015-05-08 MED ORDER — OXYCODONE-ACETAMINOPHEN 5-325 MG PO TABS
2.0000 | ORAL_TABLET | Freq: Once | ORAL | Status: AC
  Administered 2015-05-08: 2 via ORAL
  Filled 2015-05-08: qty 2

## 2015-05-08 MED ORDER — OXYCODONE-ACETAMINOPHEN 5-325 MG PO TABS: 1.0000 | ORAL_TABLET | ORAL | Status: DC | PRN

## 2015-05-08 NOTE — Discharge Instructions (Signed)

## 2015-05-08 NOTE — ED Notes (Signed)
Patient reports left hip pain x 3 days. Reports hip replacement in January, has a doctors appt Monday, but pain is getting worse. Patient ambulatory.
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
## 2015-09-10 ENCOUNTER — Encounter (HOSPITAL_COMMUNITY): Payer: Self-pay | Admitting: Emergency Medicine

## 2015-09-10 ENCOUNTER — Emergency Department (HOSPITAL_COMMUNITY): Payer: Medicare Other

## 2015-09-10 ENCOUNTER — Emergency Department (HOSPITAL_COMMUNITY)
Admission: EM | Admit: 2015-09-10 | Discharge: 2015-09-10 | Disposition: A | Discharge status: Home / Self Care | Payer: Medicare Other | Attending: Emergency Medicine | Admitting: Emergency Medicine

## 2015-09-10 DIAGNOSIS — I10 Essential (primary) hypertension: Secondary | ICD-10-CM | POA: Diagnosis not present

## 2015-09-10 DIAGNOSIS — Z79899 Other long term (current) drug therapy: Secondary | ICD-10-CM | POA: Insufficient documentation

## 2015-09-10 DIAGNOSIS — R05 Cough: Secondary | ICD-10-CM | POA: Insufficient documentation

## 2015-09-10 DIAGNOSIS — S4991XA Unspecified injury of right shoulder and upper arm, initial encounter: Secondary | ICD-10-CM | POA: Insufficient documentation

## 2015-09-10 DIAGNOSIS — K219 Gastro-esophageal reflux disease without esophagitis: Secondary | ICD-10-CM | POA: Diagnosis not present

## 2015-09-10 DIAGNOSIS — E876 Hypokalemia: Secondary | ICD-10-CM | POA: Insufficient documentation

## 2015-09-10 DIAGNOSIS — M62838 Other muscle spasm: Secondary | ICD-10-CM | POA: Insufficient documentation

## 2015-09-10 DIAGNOSIS — Y998 Other external cause status: Secondary | ICD-10-CM | POA: Diagnosis not present

## 2015-09-10 DIAGNOSIS — F419 Anxiety disorder, unspecified: Secondary | ICD-10-CM | POA: Diagnosis not present

## 2015-09-10 DIAGNOSIS — Y9389 Activity, other specified: Secondary | ICD-10-CM | POA: Insufficient documentation

## 2015-09-10 DIAGNOSIS — J449 Chronic obstructive pulmonary disease, unspecified: Secondary | ICD-10-CM | POA: Insufficient documentation

## 2015-09-10 DIAGNOSIS — Z8739 Personal history of other diseases of the musculoskeletal system and connective tissue: Secondary | ICD-10-CM | POA: Insufficient documentation

## 2015-09-10 DIAGNOSIS — Z7951 Long term (current) use of inhaled steroids: Secondary | ICD-10-CM | POA: Insufficient documentation

## 2015-09-10 DIAGNOSIS — S3992XA Unspecified injury of lower back, initial encounter: Secondary | ICD-10-CM | POA: Diagnosis not present

## 2015-09-10 DIAGNOSIS — F329 Major depressive disorder, single episode, unspecified: Secondary | ICD-10-CM | POA: Diagnosis not present

## 2015-09-10 DIAGNOSIS — R093 Abnormal sputum: Secondary | ICD-10-CM | POA: Insufficient documentation

## 2015-09-10 DIAGNOSIS — G8929 Other chronic pain: Secondary | ICD-10-CM | POA: Diagnosis not present

## 2015-09-10 DIAGNOSIS — F17210 Nicotine dependence, cigarettes, uncomplicated: Secondary | ICD-10-CM | POA: Insufficient documentation

## 2015-09-10 DIAGNOSIS — S199XXA Unspecified injury of neck, initial encounter: Secondary | ICD-10-CM | POA: Diagnosis not present

## 2015-09-10 DIAGNOSIS — Y9289 Other specified places as the place of occurrence of the external cause: Secondary | ICD-10-CM | POA: Insufficient documentation

## 2015-09-10 DIAGNOSIS — S299XXA Unspecified injury of thorax, initial encounter: Secondary | ICD-10-CM | POA: Insufficient documentation

## 2015-09-10 DIAGNOSIS — X58XXXA Exposure to other specified factors, initial encounter: Secondary | ICD-10-CM | POA: Insufficient documentation

## 2015-09-10 LAB — BASIC METABOLIC PANEL
ANION GAP: 6 (ref 5–15)
BUN: 28 mg/dL — ABNORMAL HIGH (ref 6–20)
CALCIUM: 8.8 mg/dL — AB (ref 8.9–10.3)
CO2: 24 mmol/L (ref 22–32)
Chloride: 113 mmol/L — ABNORMAL HIGH (ref 101–111)
Creatinine, Ser: 0.93 mg/dL (ref 0.44–1.00)
GFR calc non Af Amer: >60 mL/min (ref >60)
Glucose, Bld: 128 mg/dL — ABNORMAL HIGH (ref 65–99)
Potassium: 3.1 mmol/L — ABNORMAL LOW (ref 3.5–5.1)
SODIUM: 143 mmol/L (ref 135–145)

## 2015-09-10 LAB — CBC
HCT: 35.1 % — ABNORMAL LOW (ref 36.0–46.0)
HEMOGLOBIN: 12 g/dL (ref 12.0–15.0)
MCH: 31.1 pg (ref 26.0–34.0)
MCHC: 34.2 g/dL (ref 30.0–36.0)
MCV: 90.9 fL (ref 78.0–100.0)
Platelets: 229 10*3/uL (ref 150–400)
RBC: 3.86 MIL/uL — AB (ref 3.87–5.11)
RDW: 13.9 % (ref 11.5–15.5)
WBC: 9.2 10*3/uL (ref 4.0–10.5)

## 2015-09-10 LAB — TROPONIN I

## 2015-09-10 MED ORDER — IBUPROFEN 800 MG PO TABS: 800.0000 mg | ORAL_TABLET | Freq: Three times a day (TID) | ORAL | Status: DC

## 2015-09-10 MED ORDER — METHOCARBAMOL 500 MG PO TABS
500.0000 mg | ORAL_TABLET | Freq: Once | ORAL | Status: AC
  Administered 2015-09-10: 500 mg via ORAL
  Filled 2015-09-10: qty 1

## 2015-09-10 MED ORDER — KETOROLAC TROMETHAMINE 30 MG/ML IJ SOLN
30.0000 mg | Freq: Once | INTRAMUSCULAR | Status: AC
  Administered 2015-09-10: 30 mg via INTRAVENOUS
  Filled 2015-09-10: qty 1

## 2015-09-10 MED ORDER — METHOCARBAMOL 500 MG PO TABS: 500.0000 mg | ORAL_TABLET | Freq: Two times a day (BID) | ORAL | Status: DC | PRN

## 2015-09-10 MED ORDER — POTASSIUM CHLORIDE ER 10 MEQ PO TBCR: 10.0000 meq | EXTENDED_RELEASE_TABLET | Freq: Every day | ORAL | Status: DC

## 2015-09-10 NOTE — Discharge Instructions (Signed)
Heat packs, motrin 3 times daily - robaxin twice daily - don't drive when taking muscle relaxers Stretch your muscles - do your daily routine/  Please obtain all of your results from medical records or have your doctors office obtain the results - share them with your doctor - you should be seen at your doctors office in the next 2 days. Call today to arrange your follow up. Take the medications as prescribed. Please review all of the medicines and only take them if you do not have an allergy to them. Please be aware that if you are taking birth control pills, taking other prescriptions, ESPECIALLY ANTIBIOTICS may make the birth control ineffective - if this is the case, either do not engage in sexual activity or use alternative methods of birth control such as condoms until you have finished the medicine and your family doctor says it is OK to restart them. If you are on a blood thinner such as COUMADIN, be aware that any other medicine that you take may cause the coumadin to either work too much, or not enough - you should have your coumadin level rechecked in next 7 days if this is the case.  ?  It is also a possibility that you have an allergic reaction to any of the medicines that you have been prescribed - Everybody reacts differently to medications and while MOST people have no trouble with most medicines, you may have a reaction such as nausea, vomiting, rash, swelling, shortness of breath. If this is the case, please stop taking the medicine immediately and contact your physician.  ?  You should return to the ER if you develop severe or worsening symptoms.

## 2015-09-10 NOTE — ED Notes (Addendum)
Pt reports CP and SOB that has been going on for over 3 weeks. Pt states, the pain starts on the RT side of her neck, radiates to RT shoulder down into chest. Pt also reports feeling SOB. Pt hx asthma. States she has had a productive cough with green sputum.
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
## 2015-09-25 ENCOUNTER — Other Ambulatory Visit (HOSPITAL_COMMUNITY): Payer: Self-pay | Admitting: Family Medicine

## 2015-09-25 DIAGNOSIS — Z1231 Encounter for screening mammogram for malignant neoplasm of breast: Secondary | ICD-10-CM

## 2015-10-09 ENCOUNTER — Ambulatory Visit (HOSPITAL_COMMUNITY): Payer: Medicare Other

## 2015-11-28 ENCOUNTER — Emergency Department (HOSPITAL_COMMUNITY)
Admission: EM | Admit: 2015-11-28 | Discharge: 2015-11-28 | Disposition: A | Discharge status: Home / Self Care | Payer: Medicare Other | Attending: Emergency Medicine | Admitting: Emergency Medicine

## 2015-11-28 ENCOUNTER — Encounter (HOSPITAL_COMMUNITY): Payer: Self-pay | Admitting: *Deleted

## 2015-11-28 DIAGNOSIS — L03011 Cellulitis of right finger: Secondary | ICD-10-CM | POA: Diagnosis not present

## 2015-11-28 DIAGNOSIS — F329 Major depressive disorder, single episode, unspecified: Secondary | ICD-10-CM | POA: Diagnosis not present

## 2015-11-28 DIAGNOSIS — J449 Chronic obstructive pulmonary disease, unspecified: Secondary | ICD-10-CM | POA: Diagnosis not present

## 2015-11-28 DIAGNOSIS — W231XXA Caught, crushed, jammed, or pinched between stationary objects, initial encounter: Secondary | ICD-10-CM | POA: Insufficient documentation

## 2015-11-28 DIAGNOSIS — S6991XA Unspecified injury of right wrist, hand and finger(s), initial encounter: Secondary | ICD-10-CM | POA: Diagnosis present

## 2015-11-28 DIAGNOSIS — F17210 Nicotine dependence, cigarettes, uncomplicated: Secondary | ICD-10-CM | POA: Diagnosis not present

## 2015-11-28 DIAGNOSIS — J45909 Unspecified asthma, uncomplicated: Secondary | ICD-10-CM | POA: Diagnosis not present

## 2015-11-28 DIAGNOSIS — Y929 Unspecified place or not applicable: Secondary | ICD-10-CM | POA: Diagnosis not present

## 2015-11-28 DIAGNOSIS — Y999 Unspecified external cause status: Secondary | ICD-10-CM | POA: Diagnosis not present

## 2015-11-28 DIAGNOSIS — Z23 Encounter for immunization: Secondary | ICD-10-CM | POA: Diagnosis not present

## 2015-11-28 DIAGNOSIS — Z79899 Other long term (current) drug therapy: Secondary | ICD-10-CM | POA: Insufficient documentation

## 2015-11-28 DIAGNOSIS — Y939 Activity, unspecified: Secondary | ICD-10-CM | POA: Diagnosis not present

## 2015-11-28 MED ORDER — TETANUS-DIPHTH-ACELL PERTUSSIS 5-2.5-18.5 LF-MCG/0.5 IM SUSP
0.5000 mL | Freq: Once | INTRAMUSCULAR | Status: AC
  Administered 2015-11-28: 0.5 mL via INTRAMUSCULAR

## 2015-11-28 MED ORDER — PROMETHAZINE HCL 12.5 MG PO TABS
12.5000 mg | ORAL_TABLET | Freq: Once | ORAL | Status: AC
  Administered 2015-11-28: 12.5 mg via ORAL
  Filled 2015-11-28: qty 1

## 2015-11-28 MED ORDER — OXYCODONE-ACETAMINOPHEN 5-325 MG PO TABS: 1.0000 | ORAL_TABLET | Freq: Four times a day (QID) | ORAL | Status: DC | PRN

## 2015-11-28 MED ORDER — LIDOCAINE HCL (PF) 1 % IJ SOLN
5.0000 mL | Freq: Once | INTRAMUSCULAR | Status: AC
  Administered 2015-11-28: 2.1 mL
  Filled 2015-11-28: qty 5

## 2015-11-28 MED ORDER — HYDROCODONE-ACETAMINOPHEN 5-325 MG PO TABS
2.0000 | ORAL_TABLET | Freq: Once | ORAL | Status: AC
  Administered 2015-11-28: 2 via ORAL
  Filled 2015-11-28: qty 2

## 2015-11-28 MED ORDER — DOXYCYCLINE HYCLATE 100 MG PO CAPS: 100.0000 mg | ORAL_CAPSULE | Freq: Two times a day (BID) | ORAL | Status: DC

## 2015-11-28 MED ORDER — TETANUS-DIPHTH-ACELL PERTUSSIS 5-2.5-18.5 LF-MCG/0.5 IM SUSP
INTRAMUSCULAR | Status: AC
  Filled 2015-11-28: qty 0.5

## 2015-11-28 MED ORDER — CEFTRIAXONE SODIUM 1 G IJ SOLR
1.0000 g | Freq: Once | INTRAMUSCULAR | Status: AC
  Administered 2015-11-28: 1 g via INTRAMUSCULAR
  Filled 2015-11-28: qty 10

## 2015-11-28 MED ORDER — LIDOCAINE HCL (PF) 1 % IJ SOLN
INTRAMUSCULAR | Status: AC
  Filled 2015-11-28: qty 5
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
## 2015-11-28 NOTE — ED Notes (Signed)
Pt comes in for right hand middle finger pain. She states she got a staple in her finger 2 days ago.

## 2015-11-28 NOTE — Discharge Instructions (Signed)
Please cleanse the wounds daily with soap and water. Please apply dressing daily. Please use doxycycline 2 times daily with food. Use Tylenol or ibuprofen for mild pain, use Percocet for more severe pain. Percocet may cause drowsiness, and/or constipation. Please use this medication with caution. Please see Dr. Karie Kirks, or return to the emergency department if signs of advancing infection. Paronychia  Paronychia is an infection of the skin. It happens near a fingernail or toenail. It may cause pain and swelling around the nail. Usually, it is not serious and it clears up with treatment. HOME CARE  Soak the fingers or toes in warm water as told by your doctor. You may be told to do this for 20 minutes, 2-3 times a day.  Keep the area dry when you are not soaking it.  Take medicines only as told by your doctor.  If you were given an antibiotic medicine, finish all of it even if you start to feel better.  Keep the affected area clean.  Do not try to drain a fluid-filled bump yourself.  Wear rubber gloves when putting your hands in water.  Wear gloves if your hands might touch cleaners or chemicals.  Follow your doctor's instructions about:  Wound care.  Bandage (dressing) changes and removal. GET HELP IF:  Your symptoms get worse or do not improve.  You have a fever or chills.  You have redness spreading from the affected area.  You have more fluid, blood, or pus coming from the affected area.  Your finger or knuckle is swollen or is hard to move.   This information is not intended to replace advice given to you by your health care provider. Make sure you discuss any questions you have with your health care provider.   Document Released: 08/21/2009 Document Revised: 01/17/2015 Document Reviewed: 08/10/2014 Elsevier Interactive Patient Education [employer].

## 2015-12-01 LAB — CULTURE, ROUTINE-ABSCESS: GRAM STAIN: NONE SEEN

## 2015-12-02 ENCOUNTER — Telehealth (HOSPITAL_BASED_OUTPATIENT_CLINIC_OR_DEPARTMENT_OTHER): Payer: Self-pay | Admitting: Emergency Medicine

## 2015-12-02 NOTE — Telephone Encounter (Signed)
Post ED Visit - Positive Culture Follow-up  Culture report reviewed by antimicrobial stewardship pharmacist:  []  Elenor Quinones, Pharm.D. []  Heide Guile, Pharm.D., BCPS [x]  Parks Neptune, Pharm.D. []  Alycia Rossetti, Pharm.D., BCPS []  [place], [place].D., BCPS, AAHIVP []  Legrand Como, Pharm.D., BCPS, AAHIVP []  Milus Glazier, Pharm.D. []  Stephens November, Pharm.D.  Positive abcess culture Treated with doxycycline, organism sensitive to the same and no further patient follow-up is required at this time.  Hazle Nordmann 12/02/2015, 10:00 AM

## 2015-12-04 ENCOUNTER — Other Ambulatory Visit (HOSPITAL_COMMUNITY): Payer: Self-pay | Admitting: Family Medicine

## 2015-12-05 ENCOUNTER — Other Ambulatory Visit (HOSPITAL_COMMUNITY): Payer: Self-pay | Admitting: Family Medicine

## 2015-12-05 DIAGNOSIS — R591 Generalized enlarged lymph nodes: Secondary | ICD-10-CM

## 2015-12-06 ENCOUNTER — Ambulatory Visit (INDEPENDENT_AMBULATORY_CARE_PROVIDER_SITE_OTHER): Payer: Medicare Other | Admitting: Orthopaedic Surgery

## 2015-12-06 VITALS — BP 161/88 | HR 58 | Temp 97.5°F | Ht 65.0 in | Wt 121.8 lb

## 2015-12-06 DIAGNOSIS — A4902 Methicillin resistant Staphylococcus aureus infection, unspecified site: Secondary | ICD-10-CM | POA: Diagnosis not present

## 2015-12-06 DIAGNOSIS — L089 Local infection of the skin and subcutaneous tissue, unspecified: Secondary | ICD-10-CM | POA: Diagnosis not present

## 2015-12-06 DIAGNOSIS — L03011 Cellulitis of right finger: Secondary | ICD-10-CM

## 2015-12-06 DIAGNOSIS — S60949A Unspecified superficial injury of unspecified finger, initial encounter: Secondary | ICD-10-CM | POA: Diagnosis not present
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
## 2015-12-07 ENCOUNTER — Other Ambulatory Visit (HOSPITAL_COMMUNITY): Payer: Self-pay | Admitting: Family Medicine

## 2015-12-07 ENCOUNTER — Ambulatory Visit (HOSPITAL_COMMUNITY)
Admission: RE | Admit: 2015-12-07 | Discharge: 2015-12-07 | Disposition: A | Discharge status: Home / Self Care | Payer: Medicare Other | Source: Ambulatory Visit | Attending: Family Medicine | Admitting: Family Medicine

## 2015-12-07 DIAGNOSIS — M542 Cervicalgia: Secondary | ICD-10-CM

## 2015-12-07 DIAGNOSIS — R590 Localized enlarged lymph nodes: Secondary | ICD-10-CM | POA: Diagnosis present

## 2015-12-07 DIAGNOSIS — R591 Generalized enlarged lymph nodes: Secondary | ICD-10-CM

## 2015-12-07 MED ORDER — IOHEXOL 300 MG/ML  SOLN
75.0000 mL | Freq: Once | INTRAMUSCULAR | Status: AC | PRN
  Administered 2015-12-07: 75 mL via INTRAVENOUS

## 2015-12-12 ENCOUNTER — Ambulatory Visit: Payer: Medicare Other | Admitting: Orthopaedic Surgery

## 2015-12-12 ENCOUNTER — Encounter: Payer: Self-pay | Admitting: Orthopaedic Surgery

## 2016-02-12 ENCOUNTER — Emergency Department (HOSPITAL_COMMUNITY)
Admission: EM | Admit: 2016-02-12 | Discharge: 2016-02-12 | Disposition: A | Discharge status: Home / Self Care | Payer: Medicare Other | Attending: Emergency Medicine | Admitting: Emergency Medicine

## 2016-02-12 ENCOUNTER — Encounter (HOSPITAL_COMMUNITY): Payer: Self-pay | Admitting: Emergency Medicine

## 2016-02-12 DIAGNOSIS — M5412 Radiculopathy, cervical region: Secondary | ICD-10-CM | POA: Diagnosis not present

## 2016-02-12 DIAGNOSIS — J45909 Unspecified asthma, uncomplicated: Secondary | ICD-10-CM | POA: Diagnosis not present

## 2016-02-12 DIAGNOSIS — M62838 Other muscle spasm: Secondary | ICD-10-CM | POA: Diagnosis not present

## 2016-02-12 DIAGNOSIS — J449 Chronic obstructive pulmonary disease, unspecified: Secondary | ICD-10-CM | POA: Insufficient documentation

## 2016-02-12 DIAGNOSIS — F17210 Nicotine dependence, cigarettes, uncomplicated: Secondary | ICD-10-CM | POA: Insufficient documentation

## 2016-02-12 DIAGNOSIS — I10 Essential (primary) hypertension: Secondary | ICD-10-CM | POA: Diagnosis not present

## 2016-02-12 DIAGNOSIS — F329 Major depressive disorder, single episode, unspecified: Secondary | ICD-10-CM | POA: Diagnosis not present

## 2016-02-12 DIAGNOSIS — M542 Cervicalgia: Secondary | ICD-10-CM | POA: Diagnosis present

## 2016-02-12 MED ORDER — HYDROCODONE-ACETAMINOPHEN 5-325 MG PO TABS
1.0000 | ORAL_TABLET | Freq: Once | ORAL | Status: AC
  Administered 2016-02-12: 1 via ORAL
  Filled 2016-02-12: qty 1

## 2016-02-12 MED ORDER — KETOROLAC TROMETHAMINE 60 MG/2ML IM SOLN
60.0000 mg | Freq: Once | INTRAMUSCULAR | Status: AC
  Administered 2016-02-12: 60 mg via INTRAMUSCULAR
  Filled 2016-02-12: qty 2

## 2016-02-12 MED ORDER — HYDROCODONE-ACETAMINOPHEN 5-325 MG PO TABS: 1.0000 | ORAL_TABLET | ORAL | Status: DC | PRN

## 2016-02-12 NOTE — ED Notes (Signed)
Pt states she has chronic back and neck pain and it is hurting badly.  Has been to several specialists for same thing.

## 2016-02-12 NOTE — ED Notes (Signed)
Patient verbalizes understanding of discharge instructions, prescriptions, home care and follow up care. Patient out of department at this time. 

## 2016-02-12 NOTE — ED Notes (Signed)
Patient ambulatory to restroom  ?
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
## 2016-02-12 NOTE — Discharge Instructions (Signed)
Cervical Radiculopathy Cervical radiculopathy means that a nerve in the neck is pinched or bruised. This can cause pain or loss of feeling (numbness) that runs from your neck to your arm and fingers. HOME CARE Managing Pain  Take over-the-counter and prescription medicines only as told by your doctor.  If directed, put ice on the injured or painful area.  Put ice in a plastic bag.  Place a towel between your skin and the bag.  Leave the ice on for 20 minutes, 2-3 times per day.  If ice does not help, you can try using heat. Take a warm shower or warm bath, or use a heat pack as told by your doctor.  You may try a gentle neck and shoulder massage. Activity  Rest as needed. Follow instructions from your doctor about any activities to avoid.  Do exercises as told by your doctor or physical therapist. General Instructions   If you were given a soft collar, wear it as told by your doctor.  Use a flat pillow when you sleep.  Keep all follow-up visits as told by your doctor. This is important. GET HELP IF:  Your condition does not improve with treatment. GET HELP RIGHT AWAY IF:   Your pain gets worse and is not controlled with medicine.  You lose feeling or feel weak in your hand, arm, face, or leg.  You have a fever.  You have a stiff neck.  You cannot control when you poop or pee (have incontinence).  You have trouble with walking, balance, or talking.   This information is not intended to replace advice given to you by your health care provider. Make sure you discuss any questions you have with your health care provider.   Document Released: 08/22/2011 Document Revised: 05/24/2015 Document Reviewed: 10/27/2014 Elsevier Interactive Patient Education 2016 [place] the  medication prescribed today as needed for pain, do not drive within 4 hours of taking this medication as it will make you drowsy.  As discussed I recommend taking only one of your muscle  relaxers, you should not take tizanidine and methocarbamol at the same time.  Please follow-up with your primary doctor for further management of your symptoms if they persist.  Apply heating pad to your neck and shoulder area for 20 minutes times daily.

## 2016-02-12 NOTE — ED Notes (Signed)
Patient c/o right sided neck pain, lower back pain, and bilateral leg weakness. Patient states she has a "messed up spine" and has been recommended for her to have spinal surgery to correct the issue. Patient is ambulatory with a guarded gait. Patient has full ROM in upper and lower extremities.

## 2016-02-28 ENCOUNTER — Other Ambulatory Visit (HOSPITAL_COMMUNITY): Payer: Self-pay | Admitting: Orthopedic Surgery

## 2016-02-28 DIAGNOSIS — M545 Low back pain, unspecified: Secondary | ICD-10-CM

## 2016-02-28 DIAGNOSIS — G8929 Other chronic pain: Secondary | ICD-10-CM

## 2016-03-06 ENCOUNTER — Ambulatory Visit (HOSPITAL_COMMUNITY): Payer: Medicare Other

## 2016-03-11 ENCOUNTER — Emergency Department (HOSPITAL_COMMUNITY): Payer: Medicare Other

## 2016-03-11 ENCOUNTER — Emergency Department (HOSPITAL_COMMUNITY)
Admission: EM | Admit: 2016-03-11 | Discharge: 2016-03-11 | Disposition: A | Discharge status: Home / Self Care | Payer: Medicare Other | Attending: Emergency Medicine | Admitting: Emergency Medicine

## 2016-03-11 ENCOUNTER — Encounter (HOSPITAL_COMMUNITY): Payer: Self-pay | Admitting: Emergency Medicine

## 2016-03-11 DIAGNOSIS — R51 Headache: Secondary | ICD-10-CM | POA: Insufficient documentation

## 2016-03-11 DIAGNOSIS — J45909 Unspecified asthma, uncomplicated: Secondary | ICD-10-CM | POA: Insufficient documentation

## 2016-03-11 DIAGNOSIS — J449 Chronic obstructive pulmonary disease, unspecified: Secondary | ICD-10-CM | POA: Diagnosis not present

## 2016-03-11 DIAGNOSIS — Y929 Unspecified place or not applicable: Secondary | ICD-10-CM | POA: Diagnosis not present

## 2016-03-11 DIAGNOSIS — Y999 Unspecified external cause status: Secondary | ICD-10-CM | POA: Diagnosis not present

## 2016-03-11 DIAGNOSIS — I10 Essential (primary) hypertension: Secondary | ICD-10-CM | POA: Insufficient documentation

## 2016-03-11 DIAGNOSIS — F17210 Nicotine dependence, cigarettes, uncomplicated: Secondary | ICD-10-CM | POA: Diagnosis not present

## 2016-03-11 DIAGNOSIS — Y939 Activity, unspecified: Secondary | ICD-10-CM | POA: Diagnosis not present

## 2016-03-11 DIAGNOSIS — Z79899 Other long term (current) drug therapy: Secondary | ICD-10-CM | POA: Insufficient documentation

## 2016-03-11 DIAGNOSIS — F329 Major depressive disorder, single episode, unspecified: Secondary | ICD-10-CM | POA: Diagnosis not present

## 2016-03-11 DIAGNOSIS — W19XXXA Unspecified fall, initial encounter: Secondary | ICD-10-CM | POA: Insufficient documentation

## 2016-03-11 DIAGNOSIS — R519 Headache, unspecified: Secondary | ICD-10-CM

## 2016-03-11 MED ORDER — ACETAMINOPHEN 500 MG PO TABS
1000.0000 mg | ORAL_TABLET | Freq: Once | ORAL | Status: AC
  Administered 2016-03-11: 1000 mg via ORAL
  Filled 2016-03-11: qty 2

## 2016-03-11 NOTE — Discharge Instructions (Signed)
Head injury  You have had a head injury which does not appear to require admission at this time. A concussion is a status changed mental ability because of trauma.  Seek immediate medical attention if:   There is confusion or drowsiness  You cannot awaken the injured portion  (Although children frequently become drowsy after injury)  There is nausea or continued, forceful vomiting  You notice dizziness or unsteadiness which is getting worse, or inability to walk  You have convulsions or unconsciousness  You experience a severe, persistent headaches not relieved by Tylenol. (Do not take aspirin as this in pairs clotting abilities). Take other pain medications only as directed  You cannot use arms or legs normally  There are changes in pupil size of the eye  There is clear or bloody discharge from the nose or ears  Change in speech, vision, swallowing or understanding.  Localized weakness, numbness, tingling or change in bowel or bladder control   Please followup with your doctor in the next 2 days if still having symptoms. If you do not have a family doctor, see the list of followup contact information below.  RESOURCE GUIDE  Dental Problems  Patients with Medicaid: [place] Phone:  [phone]                                                  Phone:  [phone]  If unable to pay or uninsured, contact:  [facility] or [facility]. to become qualified for the adult dental clinic.  Chronic Pain Problems Contact [facility]  [phone] Patients need to be referred by their primary care doctor.  Insufficient Money for Medicine Contact [employer]:  call "211" or [place] (217) 427-9072.  No Primary Care Doctor Call [facility]  [phone] Other agencies that provide inexpensive medical care  [place]  [phone]    [facility]  [place]  [phone]    [facility]  [phone]    [facility]  [place]  [place]  ([phone] [place]   [phone] (emergency services 4313675838)  Substance Abuse [facility]  270-172-2958 [facility] 918-371-2663 [facility] [phone] [place] 3653045744 Residential & Outpatient Substance Abuse Program  [phone]  Abuse/Neglect [place] 623-320-8266 [place] [phone] (After Hours)  Emergency [place] [phone]  [place] [facility] [place] [phone] [place] [phone]  MRSA Hotline #:   (843)282-3747    [facility] [place]  [facility] [address] [place]                       [address]      [address]                                                  [place]                            [place] Phone:  [phone]                                   Phone:  [phone]                 Phone:  [phone]  [facility] Phone:  [phone]  [place] Child Abuse Hotline (336) 342-1394 [phone] (After Hours)    
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
## 2016-03-14 ENCOUNTER — Ambulatory Visit (HOSPITAL_COMMUNITY)
Admission: RE | Admit: 2016-03-14 | Discharge: 2016-03-14 | Disposition: A | Discharge status: Home / Self Care | Payer: Medicare Other | Source: Ambulatory Visit | Attending: Orthopedic Surgery | Admitting: Orthopedic Surgery

## 2016-03-14 DIAGNOSIS — G8929 Other chronic pain: Secondary | ICD-10-CM

## 2016-03-14 DIAGNOSIS — M50321 Other cervical disc degeneration at C4-C5 level: Secondary | ICD-10-CM | POA: Insufficient documentation

## 2016-03-14 DIAGNOSIS — M542 Cervicalgia: Secondary | ICD-10-CM | POA: Insufficient documentation

## 2016-03-14 DIAGNOSIS — M50223 Other cervical disc displacement at C6-C7 level: Secondary | ICD-10-CM | POA: Diagnosis not present

## 2016-03-14 DIAGNOSIS — M545 Low back pain, unspecified: Secondary | ICD-10-CM

## 2016-03-25 ENCOUNTER — Telehealth (HOSPITAL_COMMUNITY): Payer: Self-pay

## 2016-03-25 NOTE — Telephone Encounter (Signed)
03/25/16 pt called and said that [facility] office was supposed to have referred her .... She said they were supposed to give her an order before she left their office but she didn't receive it.  I faxed over a referral for PT asking if they could send [place] her order so that we could get her scheduled.  I told her that we would call her back.

## 2016-04-08 ENCOUNTER — Ambulatory Visit (HOSPITAL_COMMUNITY): Payer: Medicare Other | Attending: Orthopedic Surgery

## 2016-04-08 ENCOUNTER — Encounter (INDEPENDENT_AMBULATORY_CARE_PROVIDER_SITE_OTHER): Payer: Self-pay

## 2016-04-08 DIAGNOSIS — M542 Cervicalgia: Secondary | ICD-10-CM | POA: Diagnosis present

## 2016-04-08 DIAGNOSIS — M5412 Radiculopathy, cervical region: Secondary | ICD-10-CM | POA: Diagnosis present

## 2016-04-08 DIAGNOSIS — M79601 Pain in right arm: Secondary | ICD-10-CM | POA: Insufficient documentation

## 2016-04-08 NOTE — Patient Instructions (Signed)
   Self Traction with Towel Roll  1. Roll up a small bath towel or large hand towel  2. Place at base of neck until  3. Lie for 5-10 minutes 4. Perform deep breathing to help neck muscles relax
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
## 2016-04-10 ENCOUNTER — Telehealth (HOSPITAL_COMMUNITY): Payer: Self-pay

## 2016-04-10 ENCOUNTER — Ambulatory Visit (HOSPITAL_COMMUNITY): Payer: Medicare Other | Admitting: Physical Therapy

## 2016-04-12 ENCOUNTER — Ambulatory Visit (HOSPITAL_COMMUNITY): Payer: Medicare Other | Admitting: Physical Therapy

## 2016-04-12 DIAGNOSIS — M5412 Radiculopathy, cervical region: Secondary | ICD-10-CM

## 2016-04-12 DIAGNOSIS — M542 Cervicalgia: Secondary | ICD-10-CM | POA: Diagnosis not present

## 2016-04-12 DIAGNOSIS — M79601 Pain in right arm: Secondary | ICD-10-CM
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
## 2016-04-16 ENCOUNTER — Ambulatory Visit (HOSPITAL_COMMUNITY): Payer: Medicare Other

## 2016-04-16 ENCOUNTER — Telehealth (HOSPITAL_COMMUNITY): Payer: Self-pay

## 2016-04-17 ENCOUNTER — Ambulatory Visit (HOSPITAL_COMMUNITY): Payer: Medicare Other

## 2016-04-19 ENCOUNTER — Encounter (HOSPITAL_COMMUNITY): Payer: Medicare Other

## 2016-04-19 NOTE — Telephone Encounter (Signed)
cx

## 2016-04-19 NOTE — Telephone Encounter (Signed)
Called to cx appt

## 2016-04-22 ENCOUNTER — Encounter (HOSPITAL_COMMUNITY): Payer: Medicare Other | Admitting: Physical Therapy

## 2016-04-24 ENCOUNTER — Encounter (HOSPITAL_COMMUNITY): Payer: Medicare Other | Admitting: Physical Therapy

## 2016-04-26 ENCOUNTER — Ambulatory Visit (HOSPITAL_COMMUNITY): Payer: Medicare Other

## 2016-04-29 ENCOUNTER — Encounter (HOSPITAL_COMMUNITY): Payer: Medicare Other | Admitting: Physical Therapy

## 2016-05-01 ENCOUNTER — Encounter (HOSPITAL_COMMUNITY): Payer: Medicare Other | Admitting: Physical Therapy

## 2016-05-03 ENCOUNTER — Encounter (HOSPITAL_COMMUNITY): Payer: Medicare Other

## 2016-05-06 ENCOUNTER — Encounter (HOSPITAL_COMMUNITY): Payer: Medicare Other | Admitting: Physical Therapy

## 2016-05-08 ENCOUNTER — Encounter (HOSPITAL_COMMUNITY): Payer: Medicare Other | Admitting: Physical Therapy

## 2016-05-10 ENCOUNTER — Encounter (HOSPITAL_COMMUNITY): Payer: Medicare Other

## 2016-05-13 ENCOUNTER — Encounter (HOSPITAL_COMMUNITY): Payer: Medicare Other | Admitting: Physical Therapy

## 2016-05-15 ENCOUNTER — Encounter (HOSPITAL_COMMUNITY): Payer: Medicare Other | Admitting: Physical Therapy

## 2016-05-17 ENCOUNTER — Ambulatory Visit (HOSPITAL_COMMUNITY): Payer: Medicare Other

## 2016-07-15 ENCOUNTER — Emergency Department (HOSPITAL_COMMUNITY): Payer: Medicare Other

## 2016-07-15 ENCOUNTER — Emergency Department (HOSPITAL_COMMUNITY)
Admission: EM | Admit: 2016-07-15 | Discharge: 2016-07-15 | Disposition: A | Discharge status: Home / Self Care | Payer: Medicare Other | Attending: Emergency Medicine | Admitting: Emergency Medicine

## 2016-07-15 ENCOUNTER — Encounter (HOSPITAL_COMMUNITY): Payer: Self-pay | Admitting: Emergency Medicine

## 2016-07-15 DIAGNOSIS — J441 Chronic obstructive pulmonary disease with (acute) exacerbation: Secondary | ICD-10-CM | POA: Diagnosis not present

## 2016-07-15 DIAGNOSIS — J4521 Mild intermittent asthma with (acute) exacerbation: Secondary | ICD-10-CM | POA: Diagnosis not present

## 2016-07-15 DIAGNOSIS — F17210 Nicotine dependence, cigarettes, uncomplicated: Secondary | ICD-10-CM | POA: Diagnosis not present

## 2016-07-15 DIAGNOSIS — I10 Essential (primary) hypertension: Secondary | ICD-10-CM | POA: Diagnosis not present

## 2016-07-15 DIAGNOSIS — B9789 Other viral agents as the cause of diseases classified elsewhere: Secondary | ICD-10-CM

## 2016-07-15 DIAGNOSIS — Z79899 Other long term (current) drug therapy: Secondary | ICD-10-CM | POA: Diagnosis not present

## 2016-07-15 DIAGNOSIS — J069 Acute upper respiratory infection, unspecified: Secondary | ICD-10-CM | POA: Diagnosis not present

## 2016-07-15 DIAGNOSIS — R05 Cough: Secondary | ICD-10-CM | POA: Diagnosis present

## 2016-07-15 MED ORDER — CHLORPHENIRAMINE-DM 4-30 MG PO TABS: ORAL_TABLET | ORAL | 0 refills | Status: DC

## 2016-07-15 MED ORDER — HYDROCOD POLST-CPM POLST ER 10-8 MG/5ML PO SUER
5.0000 mL | Freq: Once | ORAL | Status: AC
  Administered 2016-07-15: 5 mL via ORAL
  Filled 2016-07-15: qty 5

## 2016-07-15 MED ORDER — IPRATROPIUM-ALBUTEROL 0.5-2.5 (3) MG/3ML IN SOLN
RESPIRATORY_TRACT | Status: AC
Start: 2016-07-15 — End: 2016-07-15
  Administered 2016-07-15: 3 mL via RESPIRATORY_TRACT
  Filled 2016-07-15: qty 3

## 2016-07-15 MED ORDER — DEXAMETHASONE 4 MG PO TABS: 4.0000 mg | ORAL_TABLET | Freq: Two times a day (BID) | ORAL | 0 refills | Status: DC

## 2016-07-15 MED ORDER — ALBUTEROL SULFATE HFA 108 (90 BASE) MCG/ACT IN AERS: 1.0000 | INHALATION_SPRAY | Freq: Four times a day (QID) | RESPIRATORY_TRACT | 0 refills | Status: AC | PRN

## 2016-07-15 MED ORDER — PREDNISONE 20 MG PO TABS
40.0000 mg | ORAL_TABLET | Freq: Once | ORAL | Status: AC
  Administered 2016-07-15: 40 mg via ORAL
  Filled 2016-07-15: qty 2

## 2016-07-15 MED ORDER — IPRATROPIUM-ALBUTEROL 0.5-2.5 (3) MG/3ML IN SOLN
3.0000 mL | Freq: Once | RESPIRATORY_TRACT | Status: AC
  Administered 2016-07-15: 3 mL via RESPIRATORY_TRACT

## 2016-07-15 MED ORDER — HYDROCODONE-HOMATROPINE 5-1.5 MG/5ML PO SYRP: 5.0000 mL | ORAL_SOLUTION | Freq: Four times a day (QID) | ORAL | 0 refills | Status: DC | PRN

## 2016-07-15 NOTE — Discharge Instructions (Signed)
Please increase fluids. Please wash hands frequently. Use 2 puffs of albuterol every 4 hours. Decadron 2 times daily with food. May use Hycodan for cough. This medication may cause drowsiness, please do not drink alcohol, operate a vehicle, operating machinery, or participate in activities requiring concentration when taking this medication. Please see your primary physician or return to the emergency department if any changes or problems.

## 2016-07-15 NOTE — ED Triage Notes (Signed)
Patient complaining of productive cough x 2 weeks. States she is coughing up green sputum.

## 2016-07-15 NOTE — ED Notes (Signed)
Pt has ceased coughing at this time.  States she feels much better.
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
## 2016-09-03 ENCOUNTER — Telehealth: Payer: Self-pay | Admitting: Neurology

## 2016-09-03 ENCOUNTER — Ambulatory Visit (INDEPENDENT_AMBULATORY_CARE_PROVIDER_SITE_OTHER): Payer: Medicare Other | Admitting: Neurology

## 2016-09-03 ENCOUNTER — Other Ambulatory Visit: Payer: Self-pay | Admitting: Neurology

## 2016-09-03 ENCOUNTER — Encounter: Payer: Self-pay | Admitting: Neurology

## 2016-09-03 VITALS — BP 168/92 | HR 60 | Ht 65.0 in | Wt 120.0 lb

## 2016-09-03 DIAGNOSIS — I749 Embolism and thrombosis of unspecified artery: Secondary | ICD-10-CM

## 2016-09-03 DIAGNOSIS — I7090 Unspecified atherosclerosis: Secondary | ICD-10-CM

## 2016-09-03 DIAGNOSIS — R202 Paresthesia of skin: Secondary | ICD-10-CM

## 2016-09-03 DIAGNOSIS — R29898 Other symptoms and signs involving the musculoskeletal system: Secondary | ICD-10-CM | POA: Diagnosis not present

## 2016-09-03 DIAGNOSIS — M79601 Pain in right arm: Secondary | ICD-10-CM | POA: Diagnosis not present

## 2016-09-03 NOTE — Telephone Encounter (Signed)
I have spoke to Patient she is scheduled at [facility] for her US Arterial. 09/04/2016 arrive at 10:15 for 10:30 apt . Patient relayed she will be there.   Per [facility] two orders needs to be put in  Codes [id number] and [id number].  Spoke to [place] in scheduling at [facility] [phone].

## 2016-09-03 NOTE — Patient Instructions (Signed)
Remember to drink plenty of fluid, eat healthy meals and do not skip any meals. Try to eat protein with a every meal and eat a healthy snack such as fruit or nuts in between meals. Try to keep a regular sleep-wake schedule and try to exercise daily, particularly in the form of walking, 20-30 minutes a day, if you can.   As far as diagnostic testing: Ultrasound of the right arm today and an MRI brain  Our phone number is [phone]. We also have an after hours call service for urgent matters and there is a physician on-call for urgent questions. For any emergencies you know to call 911 or go to the nearest emergency room

## 2016-09-03 NOTE — Telephone Encounter (Signed)
Pt states she wants brain MRI at [facility]. cb

## 2016-09-03 NOTE — Telephone Encounter (Signed)
Placed orders as requested per AA,MD request.
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
## 2016-09-04 ENCOUNTER — Ambulatory Visit (HOSPITAL_COMMUNITY)
Admission: RE | Admit: 2016-09-04 | Discharge: 2016-09-04 | Disposition: A | Discharge status: Home / Self Care | Payer: Medicare Other | Source: Ambulatory Visit | Attending: Neurology | Admitting: Neurology

## 2016-09-04 DIAGNOSIS — I7090 Unspecified atherosclerosis: Secondary | ICD-10-CM

## 2016-09-04 DIAGNOSIS — M79601 Pain in right arm: Secondary | ICD-10-CM | POA: Diagnosis not present

## 2016-09-04 DIAGNOSIS — M7989 Other specified soft tissue disorders: Secondary | ICD-10-CM | POA: Insufficient documentation

## 2016-09-05 ENCOUNTER — Telehealth (INDEPENDENT_AMBULATORY_CARE_PROVIDER_SITE_OTHER): Payer: Self-pay | Admitting: Physician Assistant

## 2016-09-05 ENCOUNTER — Telehealth: Payer: Self-pay | Admitting: *Deleted

## 2016-09-05 NOTE — Telephone Encounter (Signed)
We didn't order the ultrasound on her arm.

## 2016-09-05 NOTE — Telephone Encounter (Signed)
Please advise 

## 2016-09-05 NOTE — Telephone Encounter (Signed)
Pt had an ultrasound on her arm and is asking if she can get results over the phone. She has an appt in [place].. She said [facility] sent her. Pt number is [phone]

## 2016-09-05 NOTE — Telephone Encounter (Signed)
Called and spoke to pt. Relayed [place] results per AA,MD note. She verbalized understanding.  She stated she was not able to have MRI because there was no order in. Advised I will send AA,MD a note to place orders and we will check on this. She verbalized understanding.

## 2016-09-05 NOTE — Telephone Encounter (Signed)
-----   Message from Melvenia Beam, MD sent at 09/05/2016 11:33 AM EST ----- Test normal thanks

## 2016-09-06 NOTE — Telephone Encounter (Signed)
Patient aware of the below message from [place]  

## 2016-09-07 NOTE — Telephone Encounter (Signed)
Done. thanks

## 2016-09-08 ENCOUNTER — Encounter: Payer: Self-pay | Admitting: Neurology

## 2016-09-19 ENCOUNTER — Telehealth: Payer: Self-pay | Admitting: Neurology

## 2016-09-19 NOTE — Telephone Encounter (Signed)
I called the patient back and informed her in order for me to schedule her MRI at [facility] I need her to call [facility] and cancel the MRI with them that she has scheduled right now for 09/26/16.. Once that has been canceled I can schedule it with Forestine Na.

## 2016-09-19 NOTE — Telephone Encounter (Signed)
Pt request to have MRI at [facility]. Please call

## 2016-09-22 DIAGNOSIS — L03221 Cellulitis of neck: Secondary | ICD-10-CM | POA: Insufficient documentation

## 2016-09-24 DIAGNOSIS — E785 Hyperlipidemia, unspecified: Secondary | ICD-10-CM | POA: Insufficient documentation

## 2016-09-24 DIAGNOSIS — F32A Depression, unspecified: Secondary | ICD-10-CM | POA: Insufficient documentation

## 2016-09-24 DIAGNOSIS — F419 Anxiety disorder, unspecified: Secondary | ICD-10-CM | POA: Insufficient documentation

## 2016-09-25 ENCOUNTER — Ambulatory Visit (INDEPENDENT_AMBULATORY_CARE_PROVIDER_SITE_OTHER): Payer: Self-pay | Admitting: Orthopaedic Surgery

## 2016-09-25 ENCOUNTER — Ambulatory Visit (INDEPENDENT_AMBULATORY_CARE_PROVIDER_SITE_OTHER): Payer: Self-pay | Admitting: Physician Assistant

## 2016-09-25 DIAGNOSIS — F1910 Other psychoactive substance abuse, uncomplicated: Secondary | ICD-10-CM | POA: Insufficient documentation

## 2016-09-26 ENCOUNTER — Other Ambulatory Visit: Payer: Medicare Other

## 2016-09-26 ENCOUNTER — Ambulatory Visit (HOSPITAL_COMMUNITY): Payer: Medicare Other

## 2016-10-08 ENCOUNTER — Ambulatory Visit (HOSPITAL_COMMUNITY): Admission: RE | Admit: 2016-10-08 | Payer: Medicare Other | Source: Ambulatory Visit

## 2016-10-10 ENCOUNTER — Ambulatory Visit (HOSPITAL_COMMUNITY)
Admission: RE | Admit: 2016-10-10 | Discharge: 2016-10-10 | Disposition: A | Discharge status: Home / Self Care | Payer: Medicare Other | Source: Ambulatory Visit | Attending: Neurology | Admitting: Neurology

## 2016-10-10 DIAGNOSIS — M79601 Pain in right arm: Secondary | ICD-10-CM | POA: Insufficient documentation

## 2016-10-10 DIAGNOSIS — I7090 Unspecified atherosclerosis: Secondary | ICD-10-CM

## 2016-10-10 DIAGNOSIS — R531 Weakness: Secondary | ICD-10-CM | POA: Diagnosis present

## 2016-10-10 DIAGNOSIS — J328 Other chronic sinusitis: Secondary | ICD-10-CM | POA: Insufficient documentation

## 2016-10-10 DIAGNOSIS — R202 Paresthesia of skin: Secondary | ICD-10-CM

## 2016-10-10 DIAGNOSIS — R29898 Other symptoms and signs involving the musculoskeletal system: Secondary | ICD-10-CM

## 2016-10-14 ENCOUNTER — Telehealth: Payer: Self-pay | Admitting: *Deleted

## 2016-10-14 NOTE — Telephone Encounter (Signed)
Tried calling number for patient: [phone], but unable to LVM. Pt needs to make payment on phone. Unable to contact. Tried calling number for fiance on DPR. VM not set up, unable to LVM

## 2016-10-14 NOTE — Telephone Encounter (Signed)
-----   Message from Melvenia Beam, MD sent at 10/13/2016  2:33 PM EST ----- MRI of the brain is unremarkable, nothing in the brain to explain her symptoms in particular no strokes or masses thanks

## 2016-10-17 NOTE — Telephone Encounter (Signed)
Called and spoke to pt about MRI brain results per AA,MD note. She verbalized understanding.  Tried calling last week but phone needed payment by patient and could not leave message.

## 2016-10-23 ENCOUNTER — Ambulatory Visit (INDEPENDENT_AMBULATORY_CARE_PROVIDER_SITE_OTHER): Payer: Medicare Other | Admitting: Orthopaedic Surgery

## 2016-10-28 ENCOUNTER — Ambulatory Visit (INDEPENDENT_AMBULATORY_CARE_PROVIDER_SITE_OTHER): Payer: Medicare Other

## 2016-10-28 ENCOUNTER — Ambulatory Visit (INDEPENDENT_AMBULATORY_CARE_PROVIDER_SITE_OTHER): Payer: Medicare Other | Admitting: Orthopaedic Surgery

## 2016-10-28 ENCOUNTER — Telehealth (INDEPENDENT_AMBULATORY_CARE_PROVIDER_SITE_OTHER): Payer: Self-pay | Admitting: *Deleted

## 2016-10-28 DIAGNOSIS — Z96643 Presence of artificial hip joint, bilateral: Secondary | ICD-10-CM

## 2016-10-28 MED ORDER — TRAMADOL HCL 50 MG PO TABS: 100.0000 mg | ORAL_TABLET | Freq: Four times a day (QID) | ORAL | 0 refills | Status: DC | PRN

## 2016-10-28 MED ORDER — CIPROFLOXACIN HCL 500 MG PO TABS: 500.0000 mg | ORAL_TABLET | Freq: Two times a day (BID) | ORAL | 0 refills | Status: DC

## 2016-10-28 MED ORDER — METHYLPREDNISOLONE 4 MG PO TABS: ORAL_TABLET | ORAL | 0 refills | Status: DC
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
## 2016-10-28 NOTE — Telephone Encounter (Signed)
Received call from [place] stating went to fill the 2 prescriptions of CIPRO and MEDROL DOSE PAK and there was flag for drug interaction stating can cause increased chance of achilles tendon rupture if used together, wants to know if still wants to use. Please call [phone]

## 2016-10-29 NOTE — Telephone Encounter (Signed)
Yes.  She still needs to use both of these medications for short-term

## 2016-10-29 NOTE — Telephone Encounter (Signed)
Pharmacy aware

## 2016-11-27 ENCOUNTER — Other Ambulatory Visit (INDEPENDENT_AMBULATORY_CARE_PROVIDER_SITE_OTHER): Payer: Self-pay | Admitting: Physician Assistant

## 2016-11-27 NOTE — Telephone Encounter (Signed)
Please advise 

## 2016-11-28 ENCOUNTER — Other Ambulatory Visit (HOSPITAL_COMMUNITY): Payer: Self-pay | Admitting: Orthopedic Surgery

## 2016-11-28 DIAGNOSIS — M25531 Pain in right wrist: Secondary | ICD-10-CM

## 2016-12-02 ENCOUNTER — Ambulatory Visit (HOSPITAL_COMMUNITY)
Admission: RE | Admit: 2016-12-02 | Discharge: 2016-12-02 | Disposition: A | Discharge status: Home / Self Care | Payer: Medicare Other | Source: Ambulatory Visit | Attending: Orthopedic Surgery | Admitting: Orthopedic Surgery

## 2016-12-02 DIAGNOSIS — M25531 Pain in right wrist: Secondary | ICD-10-CM | POA: Diagnosis present

## 2016-12-11 ENCOUNTER — Ambulatory Visit (INDEPENDENT_AMBULATORY_CARE_PROVIDER_SITE_OTHER): Payer: Medicare Other | Admitting: Obstetrics and Gynecology

## 2016-12-11 ENCOUNTER — Encounter: Payer: Self-pay | Admitting: Obstetrics and Gynecology

## 2016-12-11 VITALS — BP 122/70 | HR 68 | Ht 65.5 in | Wt 122.4 lb

## 2016-12-11 DIAGNOSIS — R102 Pelvic and perineal pain: Secondary | ICD-10-CM | POA: Diagnosis not present

## 2016-12-11 DIAGNOSIS — R319 Hematuria, unspecified: Secondary | ICD-10-CM

## 2016-12-11 LAB — POCT URINALYSIS DIPSTICK
Glucose, UA: NEGATIVE
KETONES UA: NEGATIVE
LEUKOCYTES UA: NEGATIVE
Nitrite, UA: NEGATIVE
PROTEIN UA: NEGATIVE
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
## 2016-12-16 ENCOUNTER — Other Ambulatory Visit: Payer: Self-pay | Admitting: Obstetrics and Gynecology

## 2016-12-16 DIAGNOSIS — R1031 Right lower quadrant pain: Secondary | ICD-10-CM

## 2016-12-18 ENCOUNTER — Encounter: Payer: Self-pay | Admitting: Obstetrics and Gynecology

## 2016-12-18 ENCOUNTER — Ambulatory Visit (INDEPENDENT_AMBULATORY_CARE_PROVIDER_SITE_OTHER): Payer: Medicare Other | Admitting: Obstetrics and Gynecology

## 2016-12-18 ENCOUNTER — Ambulatory Visit (INDEPENDENT_AMBULATORY_CARE_PROVIDER_SITE_OTHER): Payer: Medicare Other

## 2016-12-18 VITALS — BP 102/66 | HR 80 | Wt 120.0 lb

## 2016-12-18 DIAGNOSIS — R102 Pelvic and perineal pain: Secondary | ICD-10-CM

## 2016-12-18 DIAGNOSIS — N3001 Acute cystitis with hematuria: Secondary | ICD-10-CM

## 2016-12-18 DIAGNOSIS — R1031 Right lower quadrant pain: Secondary | ICD-10-CM | POA: Diagnosis not present

## 2016-12-18 MED ORDER — CIPROFLOXACIN HCL 500 MG PO TABS: 500.0000 mg | ORAL_TABLET | Freq: Two times a day (BID) | ORAL | 0 refills | Status: DC

## 2016-12-18 NOTE — Progress Notes (Signed)
PELVIC US TA/TV: normal vaginal cuff,bilat adnexa's WNL,no adnexal masses seen,no free fluid, right adnexal discomfort during ultrasound
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
## 2017-01-23 ENCOUNTER — Other Ambulatory Visit (INDEPENDENT_AMBULATORY_CARE_PROVIDER_SITE_OTHER): Payer: Self-pay

## 2017-01-23 ENCOUNTER — Telehealth (INDEPENDENT_AMBULATORY_CARE_PROVIDER_SITE_OTHER): Payer: Self-pay

## 2017-01-23 MED ORDER — TIZANIDINE HCL 4 MG PO TABS: 4.0000 mg | ORAL_TABLET | Freq: Three times a day (TID) | ORAL | 0 refills | Status: DC | PRN

## 2017-01-23 NOTE — Telephone Encounter (Signed)
Faxed into pharmacy.  

## 2017-01-23 NOTE — Telephone Encounter (Signed)
Requests refill of Tizanidine, continue this?

## 2017-01-23 NOTE — Telephone Encounter (Signed)
[place] for one more time, #60, no refills

## 2017-01-28 ENCOUNTER — Other Ambulatory Visit (INDEPENDENT_AMBULATORY_CARE_PROVIDER_SITE_OTHER): Payer: Self-pay | Admitting: Physician Assistant

## 2017-01-28 ENCOUNTER — Ambulatory Visit (INDEPENDENT_AMBULATORY_CARE_PROVIDER_SITE_OTHER): Payer: Medicare Other | Admitting: Orthopaedic Surgery

## 2017-01-28 DIAGNOSIS — Z96642 Presence of left artificial hip joint: Secondary | ICD-10-CM | POA: Diagnosis not present

## 2017-01-28 DIAGNOSIS — Z96641 Presence of right artificial hip joint: Secondary | ICD-10-CM

## 2017-01-28 MED ORDER — TIZANIDINE HCL 4 MG PO TABS: 4.0000 mg | ORAL_TABLET | Freq: Two times a day (BID) | ORAL | 0 refills | Status: DC | PRN

## 2017-01-28 MED ORDER — NAPROXEN 500 MG PO TABS: 500.0000 mg | ORAL_TABLET | Freq: Two times a day (BID) | ORAL | 3 refills | Status: DC | PRN

## 2017-01-28 MED ORDER — TRAMADOL HCL 50 MG PO TABS: 100.0000 mg | ORAL_TABLET | Freq: Two times a day (BID) | ORAL | 0 refills | Status: DC | PRN

## 2017-01-28 NOTE — Progress Notes (Signed)
The patient is now 2 years status post right total hip arthroplasty year status post a left total hip arthroplasty. She is doing well overall but sets of bruising for recent fall. I saw her in February x-rayed her hips last so as to x-ray her hips. She did get her primary care physician her PCP and her GYN about urinary issue and they said there was no issues at that time.  Both hips have fluid range of motion really no pain today at all.  [place] to see her back for a year. At that point I would just like a low [facility] pelvis. I will one more time provide tramadol, Zanaflex, and naproxen.

## 2017-01-28 NOTE — Telephone Encounter (Signed)
Already done

## 2017-06-24 ENCOUNTER — Other Ambulatory Visit (INDEPENDENT_AMBULATORY_CARE_PROVIDER_SITE_OTHER): Payer: Self-pay | Admitting: Orthopaedic Surgery

## 2017-06-24 NOTE — Telephone Encounter (Signed)
Please advise 

## 2017-07-31 ENCOUNTER — Other Ambulatory Visit (INDEPENDENT_AMBULATORY_CARE_PROVIDER_SITE_OTHER): Payer: Self-pay

## 2017-07-31 MED ORDER — TIZANIDINE HCL 4 MG PO TABS: 4.0000 mg | ORAL_TABLET | Freq: Two times a day (BID) | ORAL | 0 refills | Status: DC | PRN

## 2017-08-30 ENCOUNTER — Other Ambulatory Visit (INDEPENDENT_AMBULATORY_CARE_PROVIDER_SITE_OTHER): Payer: Self-pay | Admitting: Orthopaedic Surgery

## 2017-09-01 NOTE — Telephone Encounter (Signed)
Please advise 

## 2017-09-02 ENCOUNTER — Encounter: Payer: Self-pay | Admitting: *Deleted

## 2017-09-03 ENCOUNTER — Other Ambulatory Visit (INDEPENDENT_AMBULATORY_CARE_PROVIDER_SITE_OTHER): Payer: Self-pay | Admitting: Orthopaedic Surgery

## 2017-09-04 NOTE — Telephone Encounter (Signed)
Please advise 

## 2017-09-05 ENCOUNTER — Other Ambulatory Visit (HOSPITAL_COMMUNITY)
Admission: RE | Admit: 2017-09-05 | Discharge: 2017-09-05 | Disposition: A | Discharge status: Home / Self Care | Payer: Medicare Other | Source: Ambulatory Visit | Attending: Family Medicine | Admitting: Family Medicine

## 2017-09-05 DIAGNOSIS — I10 Essential (primary) hypertension: Secondary | ICD-10-CM | POA: Insufficient documentation

## 2017-09-05 DIAGNOSIS — F1020 Alcohol dependence, uncomplicated: Secondary | ICD-10-CM | POA: Insufficient documentation

## 2017-09-05 DIAGNOSIS — J0121 Acute recurrent ethmoidal sinusitis: Secondary | ICD-10-CM | POA: Insufficient documentation

## 2017-09-05 DIAGNOSIS — R5383 Other fatigue: Secondary | ICD-10-CM | POA: Insufficient documentation

## 2017-09-05 LAB — COMPREHENSIVE METABOLIC PANEL
ALBUMIN: 4.3 g/dL (ref 3.5–5.0)
ALK PHOS: 83 U/L (ref 38–126)
ALT: 26 U/L (ref 14–54)
AST: 42 U/L — AB (ref 15–41)
Anion gap: 16 — ABNORMAL HIGH (ref 5–15)
BILIRUBIN TOTAL: 0.7 mg/dL (ref 0.3–1.2)
BUN: 28 mg/dL — AB (ref 6–20)
CALCIUM: 9.4 mg/dL (ref 8.9–10.3)
CO2: 20 mmol/L — ABNORMAL LOW (ref 22–32)
Chloride: 104 mmol/L (ref 101–111)
Creatinine, Ser: 1.59 mg/dL — ABNORMAL HIGH (ref 0.44–1.00)
GFR calc Af Amer: 41 mL/min — ABNORMAL LOW (ref >60)
GFR calc non Af Amer: 36 mL/min — ABNORMAL LOW (ref >60)
GLUCOSE: 106 mg/dL — AB (ref 65–99)
Potassium: 3.2 mmol/L — ABNORMAL LOW (ref 3.5–5.1)
Sodium: 140 mmol/L (ref 135–145)
TOTAL PROTEIN: 8 g/dL (ref 6.5–8.1)

## 2017-09-05 LAB — SEDIMENTATION RATE: Sed Rate: 20 mm/hr (ref 0–22)

## 2017-09-05 LAB — TSH: TSH: 0.748 u[IU]/mL (ref 0.350–4.500)

## 2017-09-19 ENCOUNTER — Encounter (HOSPITAL_COMMUNITY): Payer: Self-pay | Admitting: Emergency Medicine

## 2017-09-19 ENCOUNTER — Emergency Department (HOSPITAL_COMMUNITY)
Admission: EM | Admit: 2017-09-19 | Discharge: 2017-09-19 | Discharge status: Against Medical Advice | Payer: Medicare Other | Attending: Emergency Medicine | Admitting: Emergency Medicine

## 2017-09-19 ENCOUNTER — Emergency Department (HOSPITAL_COMMUNITY): Payer: Medicare Other

## 2017-09-19 ENCOUNTER — Other Ambulatory Visit: Payer: Self-pay

## 2017-09-19 DIAGNOSIS — Z5321 Procedure and treatment not carried out due to patient leaving prior to being seen by health care provider: Secondary | ICD-10-CM | POA: Diagnosis not present

## 2017-09-19 DIAGNOSIS — R079 Chest pain, unspecified: Secondary | ICD-10-CM | POA: Diagnosis not present

## 2017-09-19 NOTE — ED Notes (Signed)
Radiology calling patient for xray x2, pt not found in waiting room.

## 2017-09-19 NOTE — ED Triage Notes (Signed)
Chest pain for last several days. Pt reports pain is worse with deep breath and movement. nad noted.

## 2017-09-19 NOTE — ED Notes (Signed)
Pt not in waiting room.  Assuming left.

## 2017-09-23 ENCOUNTER — Encounter: Payer: Self-pay | Admitting: *Deleted

## 2017-09-24 ENCOUNTER — Encounter: Payer: Self-pay | Admitting: *Deleted

## 2017-09-24 ENCOUNTER — Other Ambulatory Visit: Payer: Self-pay

## 2017-09-24 ENCOUNTER — Ambulatory Visit (INDEPENDENT_AMBULATORY_CARE_PROVIDER_SITE_OTHER): Payer: Medicare Other

## 2017-09-24 ENCOUNTER — Ambulatory Visit (INDEPENDENT_AMBULATORY_CARE_PROVIDER_SITE_OTHER): Payer: Medicare Other | Admitting: Cardiovascular Disease

## 2017-09-24 ENCOUNTER — Encounter: Payer: Self-pay | Admitting: Cardiovascular Disease

## 2017-09-24 ENCOUNTER — Telehealth: Payer: Self-pay | Admitting: Cardiovascular Disease

## 2017-09-24 VITALS — BP 122/90 | HR 86 | Ht 65.0 in | Wt 126.0 lb

## 2017-09-24 DIAGNOSIS — Z720 Tobacco use: Secondary | ICD-10-CM | POA: Diagnosis not present

## 2017-09-24 DIAGNOSIS — R079 Chest pain, unspecified: Secondary | ICD-10-CM

## 2017-09-24 DIAGNOSIS — I10 Essential (primary) hypertension: Secondary | ICD-10-CM | POA: Diagnosis not present

## 2017-09-24 DIAGNOSIS — R0609 Other forms of dyspnea: Secondary | ICD-10-CM | POA: Diagnosis not present

## 2017-09-24 DIAGNOSIS — Z9289 Personal history of other medical treatment: Secondary | ICD-10-CM | POA: Diagnosis not present

## 2017-09-24 LAB — ECHOCARDIOGRAM COMPLETE
CHL CUP MV DEC (S): 182
CHL CUP RV SYS PRESS: 20 mmHg
CHL CUP TV REG PEAK VELOCITY: 206 cm/s
E decel time: 182 msec
E/e' ratio: 5.97
FS: 40 % (ref 28–44)
Height: 65 in
IV/PV OW: 0.58
LA diam end sys: 27 mm
LA diam index: 1.66 cm/m2
LA vol A4C: 54.2 ml
LASIZE: 27 mm
LAVOL: 52.7 mL
LAVOLIN: 32.3 mL/m2
LV E/e' medial: 5.97
LV TDI E'MEDIAL: 8.22
LV dias vol index: 42 mL/m2
LV e' LATERAL: 11.7 cm/s
LV sys vol index: 14 mL/m2
LV sys vol: 22 mL (ref 14–42)
LVDIAVOL: 69 mL (ref 46–106)
LVEEAVG: 5.97
LVOT VTI: 20.3 cm
LVOT area: 2.54 cm2
LVOTD: 18 mm
LVOTPV: 96 cm/s
LVOTSV: 52 mL
Lateral S' vel: 12.7 cm/s
MV pk A vel: 77.1 m/s
MV pk E vel: 69.8 m/s
PW: 10.3 mm — AB (ref 0.6–1.1)
Simpson's disk: 68
Stroke v: 47 ml
TAPSE: 23.8 mm
TDI e' lateral: 11.7
TRMAXVEL: 206 cm/s
Weight: 2016 oz

## 2017-09-24 NOTE — Telephone Encounter (Signed)
Pre-cert Verification for the following procedure   ECHO-scheduled for today   09-24-17 Cardiolite Stress scheduled for 09/30/17 at [facility].

## 2017-09-24 NOTE — Progress Notes (Signed)
cavs  

## 2017-09-24 NOTE — Patient Instructions (Addendum)
Medication Instructions:  Continue all current medications.  Labwork: none  Testing/Procedures:  Your physician has requested that you have an echocardiogram. Echocardiography is a painless test that uses sound waves to create images of your heart. It provides your doctor with information about the size and shape of your heart and how well your heart's chambers and valves are working. This procedure takes approximately one hour. There are no restrictions for this procedure.  Your physician has requested that you have en exercise stress myoview. For further information please visit [email or web address]. Please follow instruction sheet, as given.  Office will contact with results via phone or letter.    Follow-Up: 1 month   Any Other Special Instructions Will Be Listed Below (If Applicable).  If you need a refill on your cardiac medications before your next appointment, please call your pharmacy.
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
## 2017-09-30 ENCOUNTER — Encounter (HOSPITAL_COMMUNITY): Payer: Medicare Other

## 2017-09-30 ENCOUNTER — Ambulatory Visit (HOSPITAL_COMMUNITY): Payer: Medicare Other

## 2017-10-01 ENCOUNTER — Telehealth: Payer: Self-pay | Admitting: *Deleted

## 2017-10-01 NOTE — Telephone Encounter (Signed)
Notes recorded by Laurine Blazer, LPN on 11/21/6576 at 4:69 PM EST Patient notified. Copy to pmd. Follow up scheduled for 10/28/2017. Stress test scheduled for 10/07/2017.  ------  Notes recorded by Laurine Blazer, LPN on 03/15/5283 at 1:32 AM EST Voice mailbox not set up yet. ------  Notes recorded by Herminio Commons, MD on 09/24/2017 at 3:54 PM EST Normal.

## 2017-10-07 ENCOUNTER — Encounter (HOSPITAL_COMMUNITY): Payer: Medicare Other

## 2017-10-09 ENCOUNTER — Telehealth: Payer: Self-pay | Admitting: *Deleted

## 2017-10-09 NOTE — Telephone Encounter (Signed)
Attempted to contact patient, voice mailbox not set up.

## 2017-10-09 NOTE — Telephone Encounter (Signed)
[place], [place], [place], [place]    FYI patient was scheduled for stress test but did not show.

## 2017-10-10 NOTE — Telephone Encounter (Signed)
Patient stated that the first appointment had to be cancelled due to her asthma.  The second one - she just forgot.  She would like to reschedule though.  Will send message to [facility] to reschedule.  Already has follow up scheduled with Dr. Raliegh Ip for 10/28/2017.

## 2017-10-21 ENCOUNTER — Encounter (HOSPITAL_COMMUNITY): Payer: Self-pay

## 2017-10-21 ENCOUNTER — Encounter (HOSPITAL_COMMUNITY)
Admission: RE | Admit: 2017-10-21 | Discharge: 2017-10-21 | Disposition: A | Discharge status: Home / Self Care | Payer: Medicare Other | Source: Ambulatory Visit | Attending: Cardiovascular Disease | Admitting: Cardiovascular Disease

## 2017-10-21 ENCOUNTER — Encounter (HOSPITAL_BASED_OUTPATIENT_CLINIC_OR_DEPARTMENT_OTHER)
Admission: RE | Admit: 2017-10-21 | Discharge: 2017-10-21 | Disposition: A | Discharge status: Home / Self Care | Payer: Medicare Other | Source: Ambulatory Visit | Attending: Cardiovascular Disease | Admitting: Cardiovascular Disease

## 2017-10-21 DIAGNOSIS — R079 Chest pain, unspecified: Secondary | ICD-10-CM | POA: Insufficient documentation

## 2017-10-21 DIAGNOSIS — R0609 Other forms of dyspnea: Secondary | ICD-10-CM | POA: Diagnosis not present

## 2017-10-21 LAB — NM MYOCAR MULTI W/SPECT W/WALL MOTION / EF
CHL CUP MPHR: 165 {beats}/min
CHL CUP NUCLEAR SSS: 1
CHL CUP RESTING HR STRESS: 75 {beats}/min
CHL RATE OF PERCEIVED EXERTION: 15
CSEPHR: 63 %
Estimated workload: 4.6 METS
Exercise duration (min): 1 min
Exercise duration (sec): 31 s
LHR: 0.42
LV dias vol: 88 mL (ref 46–106)
LV sys vol: 37 mL
Peak HR: 104 {beats}/min
SDS: 1
SRS: 0
TID: 1.15

## 2017-10-21 MED ORDER — SODIUM CHLORIDE 0.9% FLUSH
INTRAVENOUS | Status: AC
  Administered 2017-10-21: 10 mL via INTRAVENOUS
  Filled 2017-10-21: qty 10

## 2017-10-21 MED ORDER — TECHNETIUM TC 99M TETROFOSMIN IV KIT
30.0000 | PACK | Freq: Once | INTRAVENOUS | Status: AC | PRN
  Administered 2017-10-21: 30 via INTRAVENOUS

## 2017-10-21 MED ORDER — SODIUM CHLORIDE 0.9% FLUSH
INTRAVENOUS | Status: AC
  Filled 2017-10-21: qty 180

## 2017-10-21 MED ORDER — REGADENOSON 0.4 MG/5ML IV SOLN
INTRAVENOUS | Status: AC
  Administered 2017-10-21: 0.4 mg via INTRAVENOUS
  Filled 2017-10-21: qty 5

## 2017-10-21 MED ORDER — TECHNETIUM TC 99M TETROFOSMIN IV KIT
10.0000 | PACK | Freq: Once | INTRAVENOUS | Status: AC | PRN
Start: 2017-10-21 — End: 2017-10-21
  Administered 2017-10-21: 10 via INTRAVENOUS

## 2017-10-28 ENCOUNTER — Ambulatory Visit: Payer: Medicare Other | Admitting: Cardiovascular Disease

## 2017-11-28 ENCOUNTER — Ambulatory Visit: Payer: Medicare Other | Admitting: Cardiovascular Disease

## 2017-12-04 ENCOUNTER — Ambulatory Visit (INDEPENDENT_AMBULATORY_CARE_PROVIDER_SITE_OTHER): Payer: Medicare Other

## 2017-12-04 ENCOUNTER — Ambulatory Visit (INDEPENDENT_AMBULATORY_CARE_PROVIDER_SITE_OTHER): Payer: Medicare Other | Admitting: Physician Assistant

## 2017-12-04 ENCOUNTER — Encounter (INDEPENDENT_AMBULATORY_CARE_PROVIDER_SITE_OTHER): Payer: Self-pay | Admitting: Physician Assistant

## 2017-12-04 DIAGNOSIS — Z96641 Presence of right artificial hip joint: Secondary | ICD-10-CM

## 2017-12-04 DIAGNOSIS — M7062 Trochanteric bursitis, left hip: Secondary | ICD-10-CM

## 2017-12-04 MED ORDER — LIDOCAINE HCL 1 % IJ SOLN
3.0000 mL | INTRAMUSCULAR | Status: AC | PRN
  Administered 2017-12-04: 3 mL

## 2017-12-04 MED ORDER — METHYLPREDNISOLONE ACETATE 40 MG/ML IJ SUSP
40.0000 mg | INTRAMUSCULAR | Status: AC | PRN
  Administered 2017-12-04: 40 mg via INTRA_ARTICULAR
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
## 2017-12-04 NOTE — Progress Notes (Signed)
   Procedure Note  Patient: Sarah Ochoa             Date of Birth: December 05, 1960           MRN: [id number]             Visit Date: 12/04/2017   Procedures: Visit Diagnoses: History of right hip replacement - Plan: XR Pelvis 1-2 Views  Large Joint Inj: R greater trochanter on 12/04/2017 12:13 PM Indications: pain Details: 22 G 1.5 in needle, lateral approach  Arthrogram: No  Medications: 3 mL lidocaine 1 %; 40 mg methylPREDNISolone acetate 40 MG/ML Outcome: tolerated well, no immediate complications Procedure, treatment alternatives, risks and benefits explained, specific risks discussed. Consent was given by the patient. Immediately prior to procedure a time out was called to verify the correct patient, procedure, equipment, support staff and site/side marked as required. Patient was prepped and draped in the usual sterile fashion.

## 2017-12-18 ENCOUNTER — Ambulatory Visit: Payer: Medicare Other | Admitting: Cardiovascular Disease

## 2017-12-21 ENCOUNTER — Other Ambulatory Visit (INDEPENDENT_AMBULATORY_CARE_PROVIDER_SITE_OTHER): Payer: Self-pay | Admitting: Orthopaedic Surgery

## 2017-12-22 NOTE — Telephone Encounter (Signed)
Please advise 

## 2017-12-24 ENCOUNTER — Other Ambulatory Visit: Payer: Self-pay

## 2017-12-24 ENCOUNTER — Emergency Department (HOSPITAL_COMMUNITY)
Admission: EM | Admit: 2017-12-24 | Discharge: 2017-12-24 | Disposition: A | Discharge status: Home / Self Care | Payer: Medicare Other | Attending: Emergency Medicine | Admitting: Emergency Medicine

## 2017-12-24 ENCOUNTER — Encounter (HOSPITAL_COMMUNITY): Payer: Self-pay | Admitting: Emergency Medicine

## 2017-12-24 ENCOUNTER — Emergency Department (HOSPITAL_COMMUNITY): Payer: Medicare Other

## 2017-12-24 DIAGNOSIS — J449 Chronic obstructive pulmonary disease, unspecified: Secondary | ICD-10-CM | POA: Diagnosis not present

## 2017-12-24 DIAGNOSIS — F17210 Nicotine dependence, cigarettes, uncomplicated: Secondary | ICD-10-CM | POA: Diagnosis not present

## 2017-12-24 DIAGNOSIS — J45901 Unspecified asthma with (acute) exacerbation: Secondary | ICD-10-CM | POA: Diagnosis not present

## 2017-12-24 DIAGNOSIS — J4521 Mild intermittent asthma with (acute) exacerbation: Secondary | ICD-10-CM

## 2017-12-24 DIAGNOSIS — R05 Cough: Secondary | ICD-10-CM | POA: Diagnosis present

## 2017-12-24 DIAGNOSIS — I10 Essential (primary) hypertension: Secondary | ICD-10-CM | POA: Diagnosis not present

## 2017-12-24 DIAGNOSIS — J189 Pneumonia, unspecified organism: Secondary | ICD-10-CM | POA: Insufficient documentation

## 2017-12-24 DIAGNOSIS — Z79899 Other long term (current) drug therapy: Secondary | ICD-10-CM | POA: Insufficient documentation

## 2017-12-24 MED ORDER — BENZONATATE 100 MG PO CAPS: 100.0000 mg | ORAL_CAPSULE | Freq: Three times a day (TID) | ORAL | 0 refills | Status: DC | PRN

## 2017-12-24 MED ORDER — PREDNISONE 20 MG PO TABS: 40.0000 mg | ORAL_TABLET | Freq: Every day | ORAL | 0 refills | Status: DC

## 2017-12-24 MED ORDER — ACETAMINOPHEN 325 MG PO TABS
650.0000 mg | ORAL_TABLET | Freq: Once | ORAL | Status: AC
  Administered 2017-12-24: 650 mg via ORAL
  Filled 2017-12-24: qty 2

## 2017-12-24 MED ORDER — PREDNISONE 50 MG PO TABS
60.0000 mg | ORAL_TABLET | Freq: Once | ORAL | Status: AC
  Administered 2017-12-24: 60 mg via ORAL
  Filled 2017-12-24: qty 1

## 2017-12-24 MED ORDER — ALBUTEROL SULFATE (2.5 MG/3ML) 0.083% IN NEBU
2.5000 mg | INHALATION_SOLUTION | Freq: Once | RESPIRATORY_TRACT | Status: AC
  Administered 2017-12-24: 2.5 mg via RESPIRATORY_TRACT
  Filled 2017-12-24: qty 3

## 2017-12-24 MED ORDER — DOXYCYCLINE HYCLATE 100 MG PO TABS: 100.0000 mg | ORAL_TABLET | Freq: Two times a day (BID) | ORAL | 0 refills | Status: DC

## 2017-12-24 MED ORDER — IPRATROPIUM-ALBUTEROL 0.5-2.5 (3) MG/3ML IN SOLN
3.0000 mL | Freq: Once | RESPIRATORY_TRACT | Status: AC
  Administered 2017-12-24: 3 mL via RESPIRATORY_TRACT
  Filled 2017-12-24: qty 3

## 2017-12-24 MED ORDER — IBUPROFEN 800 MG PO TABS
800.0000 mg | ORAL_TABLET | Freq: Once | ORAL | Status: AC
  Administered 2017-12-24: 800 mg via ORAL
  Filled 2017-12-24: qty 1

## 2017-12-24 NOTE — ED Triage Notes (Signed)
Non productive cough and SOB X 1 week.

## 2017-12-24 NOTE — Discharge Instructions (Signed)
Take the prescriptions as directed.  Use your albuterol inhaler (2 to 4 puffs) or your albuterol nebulizer (1 unit dose) every 4 hours for the next 7 days, then as needed for cough, wheezing, or shortness of breath.  Call your regular medical doctor today to schedule a follow up appointment within the next 2 days.  Return to the Emergency Department immediately sooner if worsening.  ° °
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
## 2018-01-20 ENCOUNTER — Other Ambulatory Visit (INDEPENDENT_AMBULATORY_CARE_PROVIDER_SITE_OTHER): Payer: Self-pay | Admitting: Orthopaedic Surgery

## 2018-01-20 NOTE — Telephone Encounter (Signed)
Please advise 

## 2018-01-28 ENCOUNTER — Ambulatory Visit (INDEPENDENT_AMBULATORY_CARE_PROVIDER_SITE_OTHER): Payer: Medicare Other | Admitting: Physician Assistant

## 2018-02-02 ENCOUNTER — Ambulatory Visit (INDEPENDENT_AMBULATORY_CARE_PROVIDER_SITE_OTHER): Payer: Medicare Other | Admitting: Physician Assistant

## 2018-02-04 ENCOUNTER — Encounter (INDEPENDENT_AMBULATORY_CARE_PROVIDER_SITE_OTHER): Payer: Self-pay | Admitting: Physician Assistant

## 2018-02-04 ENCOUNTER — Ambulatory Visit (INDEPENDENT_AMBULATORY_CARE_PROVIDER_SITE_OTHER): Payer: Medicare Other | Admitting: Physician Assistant

## 2018-02-04 ENCOUNTER — Ambulatory Visit (INDEPENDENT_AMBULATORY_CARE_PROVIDER_SITE_OTHER): Payer: Medicare Other

## 2018-02-04 DIAGNOSIS — M545 Low back pain: Secondary | ICD-10-CM
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
## 2018-02-05 ENCOUNTER — Other Ambulatory Visit (INDEPENDENT_AMBULATORY_CARE_PROVIDER_SITE_OTHER): Payer: Self-pay

## 2018-02-05 DIAGNOSIS — M4807 Spinal stenosis, lumbosacral region: Secondary | ICD-10-CM

## 2018-02-06 ENCOUNTER — Encounter (HOSPITAL_COMMUNITY): Payer: Self-pay

## 2018-02-06 ENCOUNTER — Inpatient Hospital Stay (HOSPITAL_COMMUNITY): Payer: Medicare Other

## 2018-02-06 ENCOUNTER — Other Ambulatory Visit: Payer: Self-pay

## 2018-02-06 ENCOUNTER — Emergency Department (HOSPITAL_COMMUNITY): Payer: Medicare Other

## 2018-02-06 ENCOUNTER — Inpatient Hospital Stay (HOSPITAL_COMMUNITY)
Admission: EM | Admit: 2018-02-06 | Discharge: 2018-02-08 | DRG: 392 | Disposition: A | Discharge status: Home / Self Care | Payer: Medicare Other | Attending: Internal Medicine | Admitting: Internal Medicine

## 2018-02-06 DIAGNOSIS — F419 Anxiety disorder, unspecified: Secondary | ICD-10-CM | POA: Diagnosis not present

## 2018-02-06 DIAGNOSIS — Z79899 Other long term (current) drug therapy: Secondary | ICD-10-CM

## 2018-02-06 DIAGNOSIS — I10 Essential (primary) hypertension: Secondary | ICD-10-CM | POA: Diagnosis present

## 2018-02-06 DIAGNOSIS — K219 Gastro-esophageal reflux disease without esophagitis: Secondary | ICD-10-CM | POA: Diagnosis present

## 2018-02-06 DIAGNOSIS — R935 Abnormal findings on diagnostic imaging of other abdominal regions, including retroperitoneum: Secondary | ICD-10-CM

## 2018-02-06 DIAGNOSIS — K2920 Alcoholic gastritis without bleeding: Principal | ICD-10-CM | POA: Diagnosis present

## 2018-02-06 DIAGNOSIS — R55 Syncope and collapse: Secondary | ICD-10-CM

## 2018-02-06 DIAGNOSIS — F1010 Alcohol abuse, uncomplicated: Secondary | ICD-10-CM | POA: Diagnosis not present

## 2018-02-06 DIAGNOSIS — Z720 Tobacco use: Secondary | ICD-10-CM | POA: Diagnosis present

## 2018-02-06 DIAGNOSIS — F17210 Nicotine dependence, cigarettes, uncomplicated: Secondary | ICD-10-CM | POA: Diagnosis not present

## 2018-02-06 DIAGNOSIS — F329 Major depressive disorder, single episode, unspecified: Secondary | ICD-10-CM | POA: Diagnosis not present

## 2018-02-06 DIAGNOSIS — J449 Chronic obstructive pulmonary disease, unspecified: Secondary | ICD-10-CM | POA: Diagnosis present

## 2018-02-06 DIAGNOSIS — F1210 Cannabis abuse, uncomplicated: Secondary | ICD-10-CM | POA: Diagnosis present

## 2018-02-06 DIAGNOSIS — Z882 Allergy status to sulfonamides status: Secondary | ICD-10-CM | POA: Diagnosis not present

## 2018-02-06 DIAGNOSIS — R072 Precordial pain: Secondary | ICD-10-CM | POA: Diagnosis present

## 2018-02-06 DIAGNOSIS — F1410 Cocaine abuse, uncomplicated: Secondary | ICD-10-CM | POA: Diagnosis present

## 2018-02-06 DIAGNOSIS — I700 Atherosclerosis of aorta: Secondary | ICD-10-CM | POA: Diagnosis not present

## 2018-02-06 DIAGNOSIS — R740 Nonspecific elevation of levels of transaminase and lactic acid dehydrogenase [LDH]: Secondary | ICD-10-CM | POA: Diagnosis present

## 2018-02-06 DIAGNOSIS — K8689 Other specified diseases of pancreas: Secondary | ICD-10-CM | POA: Diagnosis not present

## 2018-02-06 DIAGNOSIS — R112 Nausea with vomiting, unspecified: Secondary | ICD-10-CM

## 2018-02-06 DIAGNOSIS — K859 Acute pancreatitis without necrosis or infection, unspecified: Secondary | ICD-10-CM

## 2018-02-06 DIAGNOSIS — R109 Unspecified abdominal pain: Secondary | ICD-10-CM

## 2018-02-06 DIAGNOSIS — Z885 Allergy status to narcotic agent status: Secondary | ICD-10-CM

## 2018-02-06 HISTORY — DX: Alcohol abuse, uncomplicated: F10.10

## 2018-02-06 LAB — DIFFERENTIAL
BASOS ABS: 0 10*3/uL (ref 0.0–0.1)
Basophils Relative: 0 %
EOS ABS: 0.3 10*3/uL (ref 0.0–0.7)
Eosinophils Relative: 3 %
LYMPHS ABS: 3.3 10*3/uL (ref 0.7–4.0)
LYMPHS PCT: 36 %
MONOS PCT: 8 %
Monocytes Absolute: 0.8 10*3/uL (ref 0.1–1.0)
NEUTROS ABS: 4.7 10*3/uL (ref 1.7–7.7)
NEUTROS PCT: 53 %

## 2018-02-06 LAB — URINALYSIS, ROUTINE W REFLEX MICROSCOPIC
BACTERIA UA: NONE SEEN
Bilirubin Urine: NEGATIVE
Glucose, UA: NEGATIVE mg/dL
Ketones, ur: NEGATIVE mg/dL
Leukocytes, UA: NEGATIVE
NITRITE: NEGATIVE
PH: 5 (ref 5.0–8.0)
Protein, ur: NEGATIVE mg/dL
SPECIFIC GRAVITY, URINE: 1.01 (ref 1.005–1.030)

## 2018-02-06 LAB — CBC
HEMATOCRIT: 40.3 % (ref 36.0–46.0)
HEMOGLOBIN: 13.4 g/dL (ref 12.0–15.0)
MCH: 30.5 pg (ref 26.0–34.0)
MCHC: 33.3 g/dL (ref 30.0–36.0)
MCV: 91.6 fL (ref 78.0–100.0)
Platelets: 211 10*3/uL (ref 150–400)
RBC: 4.4 MIL/uL (ref 3.87–5.11)
RDW: 15 % (ref 11.5–15.5)
WBC: 8.9 10*3/uL (ref 4.0–10.5)

## 2018-02-06 LAB — COMPREHENSIVE METABOLIC PANEL
ALBUMIN: 3.9 g/dL (ref 3.5–5.0)
ALK PHOS: 77 U/L (ref 38–126)
ALT: 38 U/L (ref 14–54)
AST: 56 U/L — AB (ref 15–41)
Anion gap: 8 (ref 5–15)
BILIRUBIN TOTAL: 0.5 mg/dL (ref 0.3–1.2)
BUN: 22 mg/dL — AB (ref 6–20)
CO2: 24 mmol/L (ref 22–32)
Calcium: 9.7 mg/dL (ref 8.9–10.3)
Chloride: 107 mmol/L (ref 101–111)
Creatinine, Ser: 0.67 mg/dL (ref 0.44–1.00)
GFR calc Af Amer: >60 mL/min (ref >60)
GFR calc non Af Amer: >60 mL/min (ref >60)
GLUCOSE: 92 mg/dL (ref 65–99)
POTASSIUM: 3.7 mmol/L (ref 3.5–5.1)
Sodium: 139 mmol/L (ref 135–145)
TOTAL PROTEIN: 7.5 g/dL (ref 6.5–8.1)

## 2018-02-06 LAB — LIPASE, BLOOD: Lipase: 89 U/L — ABNORMAL HIGH (ref 11–51)

## 2018-02-06 LAB — TROPONIN I

## 2018-02-06 MED ORDER — PANTOPRAZOLE SODIUM 40 MG IV SOLR
40.0000 mg | Freq: Once | INTRAVENOUS | Status: AC
  Administered 2018-02-06: 40 mg via INTRAVENOUS
  Filled 2018-02-06: qty 40

## 2018-02-06 MED ORDER — ONDANSETRON HCL 4 MG/2ML IJ SOLN
4.0000 mg | INTRAMUSCULAR | Status: AC | PRN
  Administered 2018-02-06 (×2): 4 mg via INTRAVENOUS
  Filled 2018-02-06 (×2): qty 2

## 2018-02-06 MED ORDER — VITAMIN B-1 100 MG PO TABS
100.0000 mg | ORAL_TABLET | Freq: Every day | ORAL | Status: DC
  Administered 2018-02-06 – 2018-02-08 (×3): 100 mg via ORAL
  Filled 2018-02-06 (×3): qty 1

## 2018-02-06 MED ORDER — LORAZEPAM 1 MG PO TABS
1.0000 mg | ORAL_TABLET | Freq: Four times a day (QID) | ORAL | Status: DC | PRN
  Filled 2018-02-06: qty 1

## 2018-02-06 MED ORDER — ONDANSETRON HCL 4 MG PO TABS: 4.0000 mg | ORAL_TABLET | Freq: Four times a day (QID) | ORAL | Status: DC | PRN

## 2018-02-06 MED ORDER — PANTOPRAZOLE SODIUM 40 MG PO TBEC
40.0000 mg | DELAYED_RELEASE_TABLET | Freq: Every day | ORAL | Status: DC
  Administered 2018-02-06 – 2018-02-07 (×2): 40 mg via ORAL
  Filled 2018-02-06 (×2): qty 1

## 2018-02-06 MED ORDER — ADULT MULTIVITAMIN W/MINERALS CH
1.0000 | ORAL_TABLET | Freq: Every day | ORAL | Status: DC
  Administered 2018-02-06 – 2018-02-08 (×3): 1 via ORAL
  Filled 2018-02-06 (×3): qty 1

## 2018-02-06 MED ORDER — NICOTINE 14 MG/24HR TD PT24
14.0000 mg | MEDICATED_PATCH | Freq: Every day | TRANSDERMAL | Status: DC
  Administered 2018-02-06 – 2018-02-08 (×3): 14 mg via TRANSDERMAL
  Filled 2018-02-06 (×3): qty 1

## 2018-02-06 MED ORDER — LORAZEPAM 2 MG/ML IJ SOLN
1.0000 mg | Freq: Four times a day (QID) | INTRAMUSCULAR | Status: DC | PRN
  Administered 2018-02-06 – 2018-02-08 (×6): 1 mg via INTRAVENOUS
  Filled 2018-02-06 (×6): qty 1

## 2018-02-06 MED ORDER — SERTRALINE HCL 50 MG PO TABS
100.0000 mg | ORAL_TABLET | Freq: Every day | ORAL | Status: DC
  Administered 2018-02-06 – 2018-02-08 (×3): 100 mg via ORAL
  Filled 2018-02-06 (×3): qty 2

## 2018-02-06 MED ORDER — TIZANIDINE HCL 4 MG PO TABS
4.0000 mg | ORAL_TABLET | Freq: Two times a day (BID) | ORAL | Status: DC | PRN
  Administered 2018-02-06: 4 mg via ORAL
  Filled 2018-02-06: qty 1

## 2018-02-06 MED ORDER — MORPHINE SULFATE (PF) 4 MG/ML IV SOLN
4.0000 mg | INTRAVENOUS | Status: AC | PRN
  Administered 2018-02-06 (×2): 4 mg via INTRAVENOUS
  Filled 2018-02-06 (×2): qty 1

## 2018-02-06 MED ORDER — GADOBENATE DIMEGLUMINE 529 MG/ML IV SOLN
10.0000 mL | Freq: Once | INTRAVENOUS | Status: AC | PRN
  Administered 2018-02-06: 10 mL via INTRAVENOUS

## 2018-02-06 MED ORDER — ENOXAPARIN SODIUM 40 MG/0.4ML ~~LOC~~ SOLN
40.0000 mg | SUBCUTANEOUS | Status: DC
  Administered 2018-02-06 – 2018-02-07 (×2): 40 mg via SUBCUTANEOUS
  Filled 2018-02-06 (×2): qty 0.4

## 2018-02-06 MED ORDER — ACETAMINOPHEN 650 MG RE SUPP: 650.0000 mg | Freq: Four times a day (QID) | RECTAL | Status: DC | PRN

## 2018-02-06 MED ORDER — SODIUM CHLORIDE 0.9 % IV SOLN
INTRAVENOUS | Status: DC
  Administered 2018-02-06: 11:00:00 via INTRAVENOUS

## 2018-02-06 MED ORDER — MORPHINE SULFATE (PF) 4 MG/ML IV SOLN
4.0000 mg | INTRAVENOUS | Status: DC | PRN
  Administered 2018-02-06 – 2018-02-08 (×7): 4 mg via INTRAVENOUS
  Filled 2018-02-06 (×7): qty 1

## 2018-02-06 MED ORDER — IOPAMIDOL (ISOVUE-300) INJECTION 61%
100.0000 mL | Freq: Once | INTRAVENOUS | Status: AC | PRN
  Administered 2018-02-06: 100 mL via INTRAVENOUS

## 2018-02-06 MED ORDER — POTASSIUM CHLORIDE IN NACL 20-0.9 MEQ/L-% IV SOLN
INTRAVENOUS | Status: DC
  Administered 2018-02-06 – 2018-02-08 (×6): via INTRAVENOUS

## 2018-02-06 MED ORDER — ACETAMINOPHEN 325 MG PO TABS: 650.0000 mg | ORAL_TABLET | Freq: Four times a day (QID) | ORAL | Status: DC | PRN

## 2018-02-06 MED ORDER — SODIUM CHLORIDE 0.9 % IV BOLUS
500.0000 mL | Freq: Once | INTRAVENOUS | Status: AC
  Administered 2018-02-06: 500 mL via INTRAVENOUS

## 2018-02-06 MED ORDER — ONDANSETRON HCL 4 MG/2ML IJ SOLN
4.0000 mg | Freq: Four times a day (QID) | INTRAMUSCULAR | Status: DC
  Administered 2018-02-06 – 2018-02-08 (×7): 4 mg via INTRAVENOUS
  Filled 2018-02-06 (×7): qty 2

## 2018-02-06 MED ORDER — ONDANSETRON HCL 4 MG/2ML IJ SOLN
4.0000 mg | Freq: Four times a day (QID) | INTRAMUSCULAR | Status: DC | PRN
  Administered 2018-02-07: 4 mg via INTRAVENOUS
  Filled 2018-02-06: qty 2

## 2018-02-06 MED ORDER — FOLIC ACID 1 MG PO TABS
1.0000 mg | ORAL_TABLET | Freq: Every day | ORAL | Status: DC
  Administered 2018-02-06 – 2018-02-08 (×3): 1 mg via ORAL
  Filled 2018-02-06 (×3): qty 1

## 2018-02-06 MED ORDER — THIAMINE HCL 100 MG/ML IJ SOLN: 100.0000 mg | Freq: Every day | INTRAMUSCULAR | Status: DC

## 2018-02-06 MED ORDER — MONTELUKAST SODIUM 10 MG PO TABS
10.0000 mg | ORAL_TABLET | Freq: Every day | ORAL | Status: DC
  Administered 2018-02-06 – 2018-02-08 (×3): 10 mg via ORAL
  Filled 2018-02-06 (×3): qty 1

## 2018-02-06 MED ORDER — TRAZODONE HCL 50 MG PO TABS
50.0000 mg | ORAL_TABLET | Freq: Every day | ORAL | Status: DC
  Administered 2018-02-06 – 2018-02-07 (×2): 50 mg via ORAL
  Filled 2018-02-06 (×2): qty 1
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
## 2018-02-06 NOTE — ED Triage Notes (Signed)
Pt sent for Dr Burnard Hawthorne office for pancreatitis. Reports abd pain located to left side and across mid abd. Reports nausea. Seem at [facility] 4 days ago
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
## 2018-02-07 ENCOUNTER — Inpatient Hospital Stay (HOSPITAL_COMMUNITY): Payer: Medicare Other

## 2018-02-07 DIAGNOSIS — I10 Essential (primary) hypertension: Secondary | ICD-10-CM | POA: Diagnosis not present

## 2018-02-07 DIAGNOSIS — F10230 Alcohol dependence with withdrawal, uncomplicated: Secondary | ICD-10-CM | POA: Diagnosis not present

## 2018-02-07 DIAGNOSIS — F1010 Alcohol abuse, uncomplicated: Secondary | ICD-10-CM | POA: Diagnosis not present

## 2018-02-07 DIAGNOSIS — R112 Nausea with vomiting, unspecified: Secondary | ICD-10-CM | POA: Diagnosis not present

## 2018-02-07 DIAGNOSIS — K2920 Alcoholic gastritis without bleeding: Secondary | ICD-10-CM | POA: Diagnosis not present

## 2018-02-07 DIAGNOSIS — Z720 Tobacco use: Secondary | ICD-10-CM | POA: Diagnosis not present

## 2018-02-07 DIAGNOSIS — F1410 Cocaine abuse, uncomplicated: Secondary | ICD-10-CM | POA: Diagnosis not present

## 2018-02-07 LAB — COMPREHENSIVE METABOLIC PANEL
ALK PHOS: 58 U/L (ref 38–126)
ALT: 27 U/L (ref 14–54)
AST: 31 U/L (ref 15–41)
Albumin: 3.3 g/dL — ABNORMAL LOW (ref 3.5–5.0)
Anion gap: 6 (ref 5–15)
BUN: 11 mg/dL (ref 6–20)
CHLORIDE: 110 mmol/L (ref 101–111)
CO2: 25 mmol/L (ref 22–32)
Calcium: 9.1 mg/dL (ref 8.9–10.3)
Creatinine, Ser: 0.71 mg/dL (ref 0.44–1.00)
GFR calc Af Amer: >60 mL/min (ref >60)
Glucose, Bld: 96 mg/dL (ref 65–99)
POTASSIUM: 3.8 mmol/L (ref 3.5–5.1)
Sodium: 141 mmol/L (ref 135–145)
Total Bilirubin: 0.6 mg/dL (ref 0.3–1.2)
Total Protein: 6.3 g/dL — ABNORMAL LOW (ref 6.5–8.1)

## 2018-02-07 LAB — CBC
HEMATOCRIT: 35.7 % — AB (ref 36.0–46.0)
HEMOGLOBIN: 11.8 g/dL — AB (ref 12.0–15.0)
MCH: 30.5 pg (ref 26.0–34.0)
MCHC: 33.1 g/dL (ref 30.0–36.0)
MCV: 92.2 fL (ref 78.0–100.0)
Platelets: 206 10*3/uL (ref 150–400)
RBC: 3.87 MIL/uL (ref 3.87–5.11)
RDW: 15.2 % (ref 11.5–15.5)
WBC: 5.6 10*3/uL (ref 4.0–10.5)

## 2018-02-07 LAB — LIPID PANEL
Cholesterol: 194 mg/dL (ref 0–200)
HDL: 63 mg/dL (ref >40)
LDL CALC: 98 mg/dL (ref 0–99)
Total CHOL/HDL Ratio: 3.1 RATIO
Triglycerides: 164 mg/dL — ABNORMAL HIGH (ref <150)
VLDL: 33 mg/dL (ref 0–40)

## 2018-02-07 LAB — RAPID URINE DRUG SCREEN, HOSP PERFORMED
AMPHETAMINES: NOT DETECTED
BENZODIAZEPINES: POSITIVE — AB
Barbiturates: NOT DETECTED
COCAINE: POSITIVE — AB
OPIATES: POSITIVE — AB
Tetrahydrocannabinol: POSITIVE — AB

## 2018-02-07 LAB — HEPATITIS B SURFACE ANTIGEN: Hepatitis B Surface Ag: NEGATIVE

## 2018-02-07 LAB — HEPATITIS C ANTIBODY

## 2018-02-07 LAB — TROPONIN I
Troponin I: <0.03 ng/mL
Troponin I: <0.03 ng/mL

## 2018-02-07 LAB — HIV ANTIBODY (ROUTINE TESTING W REFLEX): HIV Screen 4th Generation wRfx: NONREACTIVE

## 2018-02-07 MED ORDER — CHLORDIAZEPOXIDE HCL 25 MG PO CAPS
25.0000 mg | ORAL_CAPSULE | Freq: Three times a day (TID) | ORAL | Status: DC
  Administered 2018-02-08: 25 mg via ORAL
  Filled 2018-02-07: qty 1

## 2018-02-07 MED ORDER — FAMOTIDINE 20 MG PO TABS
20.0000 mg | ORAL_TABLET | Freq: Every day | ORAL | Status: DC
  Administered 2018-02-07 – 2018-02-08 (×2): 20 mg via ORAL
  Filled 2018-02-07 (×2): qty 1

## 2018-02-07 MED ORDER — CHLORDIAZEPOXIDE HCL 25 MG PO CAPS: 25.0000 mg | ORAL_CAPSULE | Freq: Every day | ORAL | Status: DC

## 2018-02-07 MED ORDER — CHLORDIAZEPOXIDE HCL 25 MG PO CAPS
25.0000 mg | ORAL_CAPSULE | ORAL | Status: DC
Start: 2018-02-09 — End: 2018-02-08

## 2018-02-07 MED ORDER — CHLORDIAZEPOXIDE HCL 25 MG PO CAPS
25.0000 mg | ORAL_CAPSULE | Freq: Four times a day (QID) | ORAL | Status: AC
  Administered 2018-02-07 (×4): 25 mg via ORAL
  Filled 2018-02-07 (×4): qty 1

## 2018-02-07 MED ORDER — PANTOPRAZOLE SODIUM 40 MG PO TBEC
40.0000 mg | DELAYED_RELEASE_TABLET | Freq: Two times a day (BID) | ORAL | Status: DC
  Administered 2018-02-07 – 2018-02-08 (×2): 40 mg via ORAL
  Filled 2018-02-07 (×2): qty 1

## 2018-02-07 NOTE — Progress Notes (Signed)
PROGRESS NOTE    Sarah Ochoa  BTD:[id number] DOB: Nov 23, 1960 DOA: 02/06/2018 PCP: Lemmie Evens, MD   Brief Narrative:  57 year old with history of anxiety/depression, COPD, essential hypertension, GERD came to the hospital with complaints of abdominal pain, nausea and vomiting.  Patient drinks heavily almost every day at home, her last drink was about 12 hours prior to her admission.  She denied any other complaints.  In the ER CT of the abdomen pelvis was negative.  Chest x-ray was negative for any acute pathology but due to intractable nausea and vomiting she was admitted to the hospital.  It was suspected this could be related to alcoholic gastritis.   Assessment & Plan:   Active Problems:   HTN (hypertension)   Tobacco abuse   Intractable vomiting with nausea   Alcohol abuse   Cocaine abuse (HCC)   Syncope, vasovagal   Precordial chest pain  Intractable nausea and vomiting, improved Alcoholic gastritis -CT of the abdomen pelvis does not show any acute intra-abdominal pathology, lipase is only minimally elevated.  It showed dilated pancreatic duct therefore MRCP was ordered.  MRCP showed mildly dilated short segment of dorsal pancreatic duct but no mass was seen. - Antiemetics as needed.  Will change Protonix from daily to twice daily before meal.  Add Pepcid. -IV fluids -Advance diet as tolerated -Provide supportive care  Alcohol abuse -Currently she is on folate, thiamine, multivitamin -On withdrawal protocol.  Will add Librium taper  Polysubstance abuse Tobacco use -Patient has been counseled to quit using all the illicit drugs including tobacco  Transaminitis; improved - Hep B 7C  is negative -HIV is negative - Right upper quadrant ordered by the admitting physician is pending  Essential hypertension -Currently amlodipine and hydrochlorothiazide is on hold  Depression and anxiety -Continue Zoloft  DVT prophylaxis: Lovenox Code Status: Full code Family  Communication: None at bedside Disposition Plan: Likely discharge next 24 hours  Consultants:   None  Procedures:   None  Antimicrobials:   None   Subjective: Feels a little better this morning.  Review of Systems Otherwise negative except as per HPI, including: General: Denies fever, chills, night sweats or unintended weight loss. Resp: Denies cough, wheezing, shortness of breath. Cardiac: Denies chest pain, palpitations, orthopnea, paroxysmal nocturnal dyspnea. GI: Denies abdominal pain, vomiting, diarrhea or constipation GU: Denies dysuria, frequency, hesitancy or incontinence MS: Denies muscle aches, joint pain or swelling Neuro: Denies headache, neurologic deficits (focal weakness, numbness, tingling), abnormal gait Psych: Denies anxiety, depression, SI/HI/AVH Skin: Denies new rashes or lesions ID: Denies sick contacts, exotic exposures, travel  Objective: Vitals:   02/06/18 1624 02/06/18 1700 02/06/18 2042 02/07/18 0511  BP: (!) 129/92  117/86 106/76  Pulse: 75  71 81  Resp: 16  16 18   Temp: 98 F (36.7 C)  98.3 F (36.8 C) 98.2 F (36.8 C)  TempSrc: Oral  Oral Oral  SpO2: 98%  99% 94%  Weight:  56.2 kg (124 lb)    Height:  5\' 6"  (1.676 m)      Intake/Output Summary (Last 24 hours) at 02/07/2018 1053 Last data filed at 02/07/2018 1000 Gross per 24 hour  Intake 2820 ml  Output 1550 ml  Net 1270 ml   Filed Weights   02/06/18 0947 02/06/18 1700  Weight: 56.2 kg (124 lb) 56.2 kg (124 lb)    Examination:  General exam: Appears calm and comfortable  Respiratory system: Clear to auscultation. Respiratory effort normal. Cardiovascular system: S1 & S2 heard, RRR.  No JVD, murmurs, rubs, gallops or clicks. No pedal edema. Gastrointestinal system: Abdomen is nondistended, soft and nontender. No organomegaly or masses felt. Normal bowel sounds heard. Central nervous system: Alert and oriented. No focal neurological deficits. Extremities: Symmetric 4+ x 5  power. Skin: No rashes, lesions or ulcers Psychiatry: Judgement and insight appear normal. Mood & affect appropriate.     Data Reviewed:   CBC: Recent Labs  Lab 02/06/18 1009 02/07/18 0615  WBC 8.9 5.6  NEUTROABS 4.7  --   HGB 13.4 11.8*  HCT 40.3 35.7*  MCV 91.6 92.2  PLT 211 782   Basic Metabolic Panel: Recent Labs  Lab 02/06/18 1009 02/07/18 0615  NA 139 141  K 3.7 3.8  CL 107 110  CO2 24 25  GLUCOSE 92 96  BUN 22* 11  CREATININE 0.67 0.71  CALCIUM 9.7 9.1   GFR: Estimated Creatinine Clearance: 70.5 mL/min (by C-G formula based on SCr of 0.71 mg/dL). Liver Function Tests: Recent Labs  Lab 02/06/18 1009 02/07/18 0615  AST 56* 31  ALT 38 27  ALKPHOS 77 58  BILITOT 0.5 0.6  PROT 7.5 6.3*  ALBUMIN 3.9 3.3*   Recent Labs  Lab 02/06/18 1009  LIPASE 89*   No results for input(s): AMMONIA in the last 168 hours. Coagulation Profile: No results for input(s): INR, PROTIME in the last 168 hours. Cardiac Enzymes: Recent Labs  Lab 02/06/18 1822 02/07/18 0043 02/07/18 0615  TROPONINI <0.03 <0.03 <0.03   BNP (last 3 results) No results for input(s): PROBNP in the last 8760 hours. HbA1C: No results for input(s): HGBA1C in the last 72 hours. CBG: No results for input(s): GLUCAP in the last 168 hours. Lipid Profile: Recent Labs    02/07/18 0615  CHOL 194  HDL 63  LDLCALC 98  TRIG 164*  CHOLHDL 3.1   Thyroid Function Tests: No results for input(s): TSH, T4TOTAL, FREET4, T3FREE, THYROIDAB in the last 72 hours. Anemia Panel: No results for input(s): VITAMINB12, FOLATE, FERRITIN, TIBC, IRON, RETICCTPCT in the last 72 hours. Sepsis Labs: No results for input(s): PROCALCITON, LATICACIDVEN in the last 168 hours.  No results found for this or any previous visit (from the past 240 hour(s)).       Radiology Studies: Dg Chest 2 View  Result Date: 02/06/2018 CLINICAL DATA:  Abdominal pain. Nausea. History of asthma, COPD and smoking. EXAM: CHEST - 2  VIEW COMPARISON:  12/24/2017; 09/20/2017; 09/02/2017 FINDINGS: Grossly unchanged cardiac silhouette and mediastinal contours. The lungs remain hyperexpanded with flattening of the diaphragms and mild diffuse slightly nodular thickening of the pulmonary interstitium. There is minimal pleuroparenchymal thickening about the bilateral major and the right minor fissures. Nipple shadows overlie the lower lungs bilaterally. No focal airspace opacities. No pleural effusion or pneumothorax. No evidence of edema. No acute osseus abnormalities. IMPRESSION: Lung hyperexpansion and bronchitic change without superimposed acute cardiopulmonary disease. Electronically Signed   By: Sandi Mariscal M.D.   On: 02/06/2018 11:03   Ct Abdomen Pelvis W Contrast  Result Date: 02/06/2018 CLINICAL DATA:  Abdominal pain. EXAM: CT ABDOMEN AND PELVIS WITH CONTRAST TECHNIQUE: Multidetector CT imaging of the abdomen and pelvis was performed using the standard protocol following bolus administration of intravenous contrast. CONTRAST:  143mL ISOVUE-300 IOPAMIDOL (ISOVUE-300) INJECTION 61% COMPARISON:  CT AP 05/05/2017 and 04/28/2013 FINDINGS: Lower chest: No acute abnormality. Hepatobiliary: No focal liver abnormality is seen. No gallstones, gallbladder wall thickening, or biliary dilatation. Pancreas: Unremarkable. There is new increase caliber of the main duct. At  the level of the head the duct measures up to 6 mm, image 34/2. No mass or inflammation identified. Spleen: Normal in size without focal abnormality. Adrenals/Urinary Tract: Normal appearance of the adrenal glands. Unremarkable appearance of both kidneys. The urinary bladder appears normal. Stomach/Bowel: The stomach is normal. The small bowel loops have a normal course and caliber. The appendix is visualized and appears normal. Unremarkable appearance of the colon. Vascular/Lymphatic: Aortic atherosclerosis without aneurysm. The portal vein and hepatic veins are patent. No abdominal or  pelvic adenopathy identified. Reproductive: Status post hysterectomy. No adnexal masses. Other: No abdominal wall hernia or abnormality. No abdominopelvic ascites. Musculoskeletal: Degenerative disc disease identified. No aggressive lytic or sclerotic bone lesions. IMPRESSION: 1. No acute findings within the abdomen or pelvis. 2. Increase caliber of the main duct which now measures 6 mm. The etiology is indeterminate. Further investigation could be obtained with contrast enhanced MRI/MRCP. 3.  Aortic Atherosclerosis (ICD10-I70.0). Electronically Signed   By: Kerby Moors M.D.   On: 02/06/2018 11:23   Mr 3d Recon At Scanner  Result Date: 02/06/2018 CLINICAL DATA:  Atypical presentation of suspected pancreatitis. Dilated main pancreatic duct. EXAM: MRI ABDOMEN WITHOUT AND WITH CONTRAST (INCLUDING MRCP) TECHNIQUE: Multiplanar multisequence MR imaging of the abdomen was performed both before and after the administration of intravenous contrast. Heavily T2-weighted images of the biliary and pancreatic ducts were obtained, and three-dimensional MRCP images were rendered by post processing. CONTRAST:  57mL MULTIHANCE GADOBENATE DIMEGLUMINE 529 MG/ML IV SOLN COMPARISON:  02/06/2018 FINDINGS: Despite efforts by the technologist and patient, motion artifact is present on today's exam and could not be eliminated. This is the usual result when MRCP is attempted in the inpatient setting where patients are less likely to be able to breath hold and cooperate in controlling motion. This reduces exam sensitivity and specificity. Lower chest: Unremarkable Hepatobiliary: No appreciable focal lesion. No significant biliary dilatation. Gallbladder unremarkable. No obvious filling defect in the biliary tree. Pancreas: Most of the dorsal pancreatic duct is about 2-3 mm in diameter. In the pancreatic head, the duct is slightly more prominent, as shown on recent CT, measuring up to 6 mm. I do not see a mass in the pancreatic head or  ampulla to account for this appearance. No pancreas divisum is identified. Spleen:  Unremarkable Adrenals/Urinary Tract:  Unremarkable Stomach/Bowel: Unremarkable Vascular/Lymphatic:  Aortoiliac atherosclerotic vascular disease. Other:  No supplemental non-categorized findings. Musculoskeletal: Unremarkable IMPRESSION: 1. Mildly dilated short segment of the dorsal pancreatic duct in the pancreatic head period cause is uncertain. I do not see a mass, subject to the limitations of motion artifact that typically occur on inpatient MRCP imaging. I speculate that the dilatation may be postinflammatory. 2.  Aortic Atherosclerosis (ICD10-I70.0). Electronically Signed   By: Van Clines M.D.   On: 02/06/2018 18:48   Mr Abdomen Mrcp Moise Boring Contast  Result Date: 02/06/2018 CLINICAL DATA:  Atypical presentation of suspected pancreatitis. Dilated main pancreatic duct. EXAM: MRI ABDOMEN WITHOUT AND WITH CONTRAST (INCLUDING MRCP) TECHNIQUE: Multiplanar multisequence MR imaging of the abdomen was performed both before and after the administration of intravenous contrast. Heavily T2-weighted images of the biliary and pancreatic ducts were obtained, and three-dimensional MRCP images were rendered by post processing. CONTRAST:  39mL MULTIHANCE GADOBENATE DIMEGLUMINE 529 MG/ML IV SOLN COMPARISON:  02/06/2018 FINDINGS: Despite efforts by the technologist and patient, motion artifact is present on today's exam and could not be eliminated. This is the usual result when MRCP is attempted in the inpatient setting where patients  are less likely to be able to breath hold and cooperate in controlling motion. This reduces exam sensitivity and specificity. Lower chest: Unremarkable Hepatobiliary: No appreciable focal lesion. No significant biliary dilatation. Gallbladder unremarkable. No obvious filling defect in the biliary tree. Pancreas: Most of the dorsal pancreatic duct is about 2-3 mm in diameter. In the pancreatic head, the duct  is slightly more prominent, as shown on recent CT, measuring up to 6 mm. I do not see a mass in the pancreatic head or ampulla to account for this appearance. No pancreas divisum is identified. Spleen:  Unremarkable Adrenals/Urinary Tract:  Unremarkable Stomach/Bowel: Unremarkable Vascular/Lymphatic:  Aortoiliac atherosclerotic vascular disease. Other:  No supplemental non-categorized findings. Musculoskeletal: Unremarkable IMPRESSION: 1. Mildly dilated short segment of the dorsal pancreatic duct in the pancreatic head period cause is uncertain. I do not see a mass, subject to the limitations of motion artifact that typically occur on inpatient MRCP imaging. I speculate that the dilatation may be postinflammatory. 2.  Aortic Atherosclerosis (ICD10-I70.0). Electronically Signed   By: Van Clines M.D.   On: 02/06/2018 18:48   US Abdomen Limited Ruq  Result Date: 02/06/2018 CLINICAL DATA:  Upper abdominal pain with nausea EXAM: ULTRASOUND ABDOMEN LIMITED RIGHT UPPER QUADRANT COMPARISON:  None. FINDINGS: Gallbladder: No gallstones or wall thickening visualized. There is no pericholecystic fluid. No sonographic Murphy sign noted by sonographer. Common bile duct: Diameter: 6 mm. No intrahepatic or extrahepatic biliary duct dilatation evident. Liver: No focal lesion identified. Within normal limits in parenchymal echogenicity. Portal vein is patent on color Doppler imaging with normal direction of blood flow towards the liver. IMPRESSION: Study within normal limits. Electronically Signed   By: Lowella Grip III M.D.   On: 02/06/2018 17:28        Scheduled Meds: . enoxaparin (LOVENOX) injection  40 mg Subcutaneous Q24H  . folic acid  1 mg Oral Daily  . montelukast  10 mg Oral Daily  . multivitamin with minerals  1 tablet Oral Daily  . nicotine  14 mg Transdermal Daily  . ondansetron (ZOFRAN) IV  4 mg Intravenous Q6H  . pantoprazole  40 mg Oral Daily  . sertraline  100 mg Oral Daily  . thiamine   100 mg Oral Daily   Or  . thiamine  100 mg Intravenous Daily  . traZODone  50 mg Oral QHS   Continuous Infusions: . sodium chloride Stopped (02/06/18 1900)  . 0.9 % NaCl with KCl 20 mEq / L 100 mL/hr at 02/07/18 0525     LOS: 1 day    I have spent 35 minutes face to face with the patient and on the ward discussing the patients care, assessment, plan and disposition with other care givers. >50% of the time was devoted counseling the patient about the risks and benefits of treatment and coordinating care.     Ankit Arsenio Loader, MD [facility] Pager [phone]   If 7PM-7AM, please contact night-coverage [email or web address] Password [facility] 02/07/2018, 10:53 AM

## 2018-02-08 DIAGNOSIS — I10 Essential (primary) hypertension: Secondary | ICD-10-CM | POA: Diagnosis not present

## 2018-02-08 DIAGNOSIS — F1010 Alcohol abuse, uncomplicated: Secondary | ICD-10-CM | POA: Diagnosis not present

## 2018-02-08 DIAGNOSIS — R112 Nausea with vomiting, unspecified: Secondary | ICD-10-CM | POA: Diagnosis not present

## 2018-02-08 DIAGNOSIS — K2920 Alcoholic gastritis without bleeding: Secondary | ICD-10-CM | POA: Diagnosis not present

## 2018-02-08 LAB — CBC
HEMATOCRIT: 34.3 % — AB (ref 36.0–46.0)
HEMOGLOBIN: 11.1 g/dL — AB (ref 12.0–15.0)
MCH: 30.2 pg (ref 26.0–34.0)
MCHC: 32.4 g/dL (ref 30.0–36.0)
MCV: 93.2 fL (ref 78.0–100.0)
Platelets: 192 10*3/uL (ref 150–400)
RBC: 3.68 MIL/uL — AB (ref 3.87–5.11)
RDW: 15 % (ref 11.5–15.5)
WBC: 5.5 10*3/uL (ref 4.0–10.5)

## 2018-02-08 LAB — COMPREHENSIVE METABOLIC PANEL
ALK PHOS: 53 U/L (ref 38–126)
ALT: 24 U/L (ref 14–54)
ANION GAP: 6 (ref 5–15)
AST: 29 U/L (ref 15–41)
Albumin: 3 g/dL — ABNORMAL LOW (ref 3.5–5.0)
BILIRUBIN TOTAL: 0.5 mg/dL (ref 0.3–1.2)
BUN: 6 mg/dL (ref 6–20)
CALCIUM: 8.9 mg/dL (ref 8.9–10.3)
CO2: 24 mmol/L (ref 22–32)
CREATININE: 0.71 mg/dL (ref 0.44–1.00)
Chloride: 109 mmol/L (ref 101–111)
GFR calc non Af Amer: >60 mL/min (ref >60)
Glucose, Bld: 93 mg/dL (ref 65–99)
Potassium: 3.8 mmol/L (ref 3.5–5.1)
Sodium: 139 mmol/L (ref 135–145)
TOTAL PROTEIN: 5.8 g/dL — AB (ref 6.5–8.1)

## 2018-02-08 LAB — MAGNESIUM: Magnesium: 1.6 mg/dL — ABNORMAL LOW (ref 1.7–2.4)

## 2018-02-08 MED ORDER — FAMOTIDINE 20 MG PO TABS: 20.0000 mg | ORAL_TABLET | Freq: Every evening | ORAL | 0 refills | Status: DC

## 2018-02-08 MED ORDER — FOLIC ACID 1 MG PO TABS: 1.0000 mg | ORAL_TABLET | Freq: Every day | ORAL | 0 refills | Status: AC

## 2018-02-08 MED ORDER — ADULT MULTIVITAMIN W/MINERALS CH: 1.0000 | ORAL_TABLET | Freq: Every day | ORAL | 0 refills | Status: AC

## 2018-02-08 MED ORDER — THIAMINE HCL 100 MG PO TABS: 100.0000 mg | ORAL_TABLET | Freq: Every day | ORAL | 0 refills | Status: AC

## 2018-02-08 MED ORDER — ONDANSETRON HCL 4 MG PO TABS: 4.0000 mg | ORAL_TABLET | Freq: Three times a day (TID) | ORAL | 1 refills | Status: AC | PRN

## 2018-02-08 MED ORDER — PANTOPRAZOLE SODIUM 40 MG PO TBEC: 40.0000 mg | DELAYED_RELEASE_TABLET | Freq: Two times a day (BID) | ORAL | 0 refills | Status: DC

## 2018-02-08 MED ORDER — CHLORDIAZEPOXIDE HCL 25 MG PO CAPS: ORAL_CAPSULE | ORAL | 0 refills | Status: AC

## 2018-02-08 NOTE — Progress Notes (Signed)
Patient is to be discharged home and in stable condition. Patient's IV removed, WNL. Patient given discharge instructions and verbalized understanding. Patient's prescriptions faxed to [facility] per patient request. Patient awaiting transportation at this time and will be escorted out by staff via wheelchair upon arrival.  Celestia Khat, RN

## 2018-02-08 NOTE — Discharge Summary (Signed)
Physician Discharge Summary  Sarah Ochoa PPJ:[id number] DOB: [birth year] DOA: 02/06/2018  PCP: Lemmie Evens, MD  Admit date: 02/06/2018 Discharge date: 02/08/2018  Admitted From: home Disposition:  home  Recommendations for Outpatient Follow-up:  1. Follow up with PCP in 1-2 weeks 2. Please obtain BMP/CBC in one week your next doctors visit.  3. Prescription for Librium taper given 4. Pepcid to be taken 20 mg daily, Protonix 40 mg twice daily before meals.  Follow-up with outpatient primary care physician and also gastroenterology for possible endoscopy if the symptoms do not improve 5. Counseled to quit using alcohol 6. Prescription for folic acid, multivitamin and thiamine given  Home Health: None Equipment/Devices: None Discharge Condition: Stable CODE STATUS: Full code Diet recommendation: 2 g salt diet  Brief/Interim Summary: 57 year old with history of anxiety/depression, COPD, essential hypertension, GERD came to the hospital with complaints of abdominal pain, nausea and vomiting.  Patient drinks heavily almost every day at home, her last drink was about 12 hours prior to her admission.  She denied any other complaints.  In the ER CT of the abdomen pelvis was negative.  Chest x-ray was negative for any acute pathology but due to intractable nausea and vomiting she was admitted to the hospital.  It was suspected this could be related to alcoholic gastritis.  Patient was started on Protonix twice daily before meals and Pepcid was added.  Diet was slowly advanced which he tolerated well.  Due to concerns of alcohol abuse and possible withdrawal she was started on withdrawal protocol and Librium taper was added and prescription were given at the time of discharge.  She was also given prescription for folate, thiamine and multivitamin.  Counseled to quit using alcohol.  Today patient has reached maximum benefit from an hospital stay.  She is tolerating oral diet and denies any abdominal  pain.  She denies any nausea as well.  We will discharge her with recommendations stated above.   Discharge Diagnoses:  Active Problems:   HTN (hypertension)   Tobacco abuse   Intractable vomiting with nausea   Alcohol abuse   Cocaine abuse (HCC)   Syncope, vasovagal   Precordial chest pain  Intractable nausea and vomiting, resolved Alcoholic gastritis; improving -CT of the abdomen pelvis does not show any acute intra-abdominal pathology, lipase is only minimally elevated.  It showed dilated pancreatic duct therefore MRCP was ordered.  MRCP showed mildly dilated short segment of dorsal pancreatic duct but no mass was seen. - Antiemetics as needed.    Advised to take Protonix 40 mg twice daily before the meal and Pepcid for 30 days.  Then she can start taking Protonix daily before the meal.  If symptoms have not improved, she will need to see gastroenterology for possible endoscopy. -Tolerating oral diet  Alcohol abuse -Prescription for folate, thiamine and multivitamin given.  Also given Librium taper dose.  Counseled to quit using alcohol  Polysubstance abuse Tobacco use -Patient has been counseled to quit using all the illicit drugs including tobacco  Transaminitis; improved -resolved  Essential hypertension -resume home meds  Depression and anxiety -Continue Zoloft  Discharge Instructions   Allergies as of 02/08/2018      Reactions   Codeine Hives   Sulfa Antibiotics Hives   Sulfonamide Derivatives Hives   Sulfamethoxazole Rash      Medication List    TAKE these medications   albuterol 108 (90 Base) MCG/ACT inhaler Commonly known as:  PROVENTIL HFA;VENTOLIN HFA Inhale 1-2 puffs into the lungs  every 6 (six) hours as needed for wheezing or shortness of breath.   albuterol (2.5 MG/3ML) 0.083% nebulizer solution Commonly known as:  PROVENTIL USE ONE AMPULE IN YOUR NEBULIZER UP TO 4 TIMES A DAY FOR BREATHING.   amLODipine 10 MG tablet Commonly known as:   NORVASC Take 1 tablet by mouth daily.   benzonatate 100 MG capsule Commonly known as:  TESSALON Take 1 capsule (100 mg total) by mouth 3 (three) times daily as needed for cough.   chlordiazePOXIDE 25 MG capsule Commonly known as:  LIBRIUM Take 1 capsule (25 mg total) by mouth 2 (two) times daily in the am and at bedtime. for 1 day, THEN 1 capsule (25 mg total) daily for 1 day. Start taking on:  02/09/2018   famotidine 20 MG tablet Commonly known as:  PEPCID Take 1 tablet (20 mg total) by mouth every evening.   fluticasone 50 MCG/ACT nasal spray Commonly known as:  FLONASE Place 2 sprays into the nose daily as needed for allergies.   folic acid 1 MG tablet Commonly known as:  FOLVITE Take 1 tablet (1 mg total) by mouth daily. Start taking on:  02/09/2018   hydrochlorothiazide 25 MG tablet Commonly known as:  HYDRODIURIL Take 25 mg by mouth every morning.   KLOR-CON 10 10 MEQ tablet Generic drug:  potassium chloride Take 10 mEq by mouth daily.   LORazepam 1 MG tablet Commonly known as:  ATIVAN Take 1 tablet by mouth daily.   multivitamin with minerals Tabs tablet Take 1 tablet by mouth daily. Start taking on:  02/09/2018   ondansetron 4 MG tablet Commonly known as:  ZOFRAN Take 1 tablet (4 mg total) by mouth every 8 (eight) hours as needed for nausea or vomiting.   pantoprazole 40 MG tablet Commonly known as:  PROTONIX Take 1 tablet (40 mg total) by mouth 2 (two) times daily before a meal. What changed:  when to take this   sertraline 100 MG tablet Commonly known as:  ZOLOFT Take 1 tablet by mouth daily.   SINGULAIR 10 MG tablet Generic drug:  montelukast Take 10 mg by mouth daily.   thiamine 100 MG tablet Take 1 tablet (100 mg total) by mouth daily. Start taking on:  02/09/2018   tiZANidine 4 MG tablet Commonly known as:  ZANAFLEX TAKE 1 TABLET (4 MG TOTAL) 2 (TWO) TIMES DAILY AS NEEDED BY MOUTH FOR MUSCLE SPASMS.   traZODone 50 MG tablet Commonly known  as:  DESYREL Take 50 mg by mouth at bedtime.      Follow-up Information    Lemmie Evens, MD. Schedule an appointment as soon as possible for a visit in 2 week(s).   Specialty:  Family Medicine Contact information: [place] [address] [phone]          Allergies  Allergen Reactions  . Codeine Hives  . Sulfa Antibiotics Hives  . Sulfonamide Derivatives Hives  . Sulfamethoxazole Rash    You were cared for by a hospitalist during your hospital stay. If you have any questions about your discharge medications or the care you received while you were in the hospital after you are discharged, you can call the unit and asked to speak with the hospitalist on call if the hospitalist that took care of you is not available. Once you are discharged, your primary care physician will handle any further medical issues. Please note that no refills for any discharge medications will be authorized once you are discharged,  as it is imperative that you return to your primary care physician (or establish a relationship with a primary care physician if you do not have one) for your aftercare needs so that they can reassess your need for medications and monitor your lab values.  Consultations:  None   Procedures/Studies: Dg Chest 2 View  Result Date: 02/06/2018 CLINICAL DATA:  Abdominal pain. Nausea. History of asthma, COPD and smoking. EXAM: CHEST - 2 VIEW COMPARISON:  12/24/2017; 09/20/2017; 09/02/2017 FINDINGS: Grossly unchanged cardiac silhouette and mediastinal contours. The lungs remain hyperexpanded with flattening of the diaphragms and mild diffuse slightly nodular thickening of the pulmonary interstitium. There is minimal pleuroparenchymal thickening about the bilateral major and the right minor fissures. Nipple shadows overlie the lower lungs bilaterally. No focal airspace opacities. No pleural effusion or pneumothorax. No evidence of edema. No acute osseus  abnormalities. IMPRESSION: Lung hyperexpansion and bronchitic change without superimposed acute cardiopulmonary disease. Electronically Signed   By: Sandi Mariscal M.D.   On: 02/06/2018 11:03   Ct Abdomen Pelvis W Contrast  Result Date: 02/06/2018 CLINICAL DATA:  Abdominal pain. EXAM: CT ABDOMEN AND PELVIS WITH CONTRAST TECHNIQUE: Multidetector CT imaging of the abdomen and pelvis was performed using the standard protocol following bolus administration of intravenous contrast. CONTRAST:  142mL ISOVUE-300 IOPAMIDOL (ISOVUE-300) INJECTION 61% COMPARISON:  CT AP 05/05/2017 and 04/28/2013 FINDINGS: Lower chest: No acute abnormality. Hepatobiliary: No focal liver abnormality is seen. No gallstones, gallbladder wall thickening, or biliary dilatation. Pancreas: Unremarkable. There is new increase caliber of the main duct. At the level of the head the duct measures up to 6 mm, image 34/2. No mass or inflammation identified. Spleen: Normal in size without focal abnormality. Adrenals/Urinary Tract: Normal appearance of the adrenal glands. Unremarkable appearance of both kidneys. The urinary bladder appears normal. Stomach/Bowel: The stomach is normal. The small bowel loops have a normal course and caliber. The appendix is visualized and appears normal. Unremarkable appearance of the colon. Vascular/Lymphatic: Aortic atherosclerosis without aneurysm. The portal vein and hepatic veins are patent. No abdominal or pelvic adenopathy identified. Reproductive: Status post hysterectomy. No adnexal masses. Other: No abdominal wall hernia or abnormality. No abdominopelvic ascites. Musculoskeletal: Degenerative disc disease identified. No aggressive lytic or sclerotic bone lesions. IMPRESSION: 1. No acute findings within the abdomen or pelvis. 2. Increase caliber of the main duct which now measures 6 mm. The etiology is indeterminate. Further investigation could be obtained with contrast enhanced MRI/MRCP. 3.  Aortic Atherosclerosis  (ICD10-I70.0). Electronically Signed   By: Kerby Moors M.D.   On: 02/06/2018 11:23   Mr 3d Recon At Scanner  Result Date: 02/06/2018 CLINICAL DATA:  Atypical presentation of suspected pancreatitis. Dilated main pancreatic duct. EXAM: MRI ABDOMEN WITHOUT AND WITH CONTRAST (INCLUDING MRCP) TECHNIQUE: Multiplanar multisequence MR imaging of the abdomen was performed both before and after the administration of intravenous contrast. Heavily T2-weighted images of the biliary and pancreatic ducts were obtained, and three-dimensional MRCP images were rendered by post processing. CONTRAST:  47mL MULTIHANCE GADOBENATE DIMEGLUMINE 529 MG/ML IV SOLN COMPARISON:  02/06/2018 FINDINGS: Despite efforts by the technologist and patient, motion artifact is present on today's exam and could not be eliminated. This is the usual result when MRCP is attempted in the inpatient setting where patients are less likely to be able to breath hold and cooperate in controlling motion. This reduces exam sensitivity and specificity. Lower chest: Unremarkable Hepatobiliary: No appreciable focal lesion. No significant biliary dilatation. Gallbladder unremarkable. No obvious filling defect in the biliary tree.  Pancreas: Most of the dorsal pancreatic duct is about 2-3 mm in diameter. In the pancreatic head, the duct is slightly more prominent, as shown on recent CT, measuring up to 6 mm. I do not see a mass in the pancreatic head or ampulla to account for this appearance. No pancreas divisum is identified. Spleen:  Unremarkable Adrenals/Urinary Tract:  Unremarkable Stomach/Bowel: Unremarkable Vascular/Lymphatic:  Aortoiliac atherosclerotic vascular disease. Other:  No supplemental non-categorized findings. Musculoskeletal: Unremarkable IMPRESSION: 1. Mildly dilated short segment of the dorsal pancreatic duct in the pancreatic head period cause is uncertain. I do not see a mass, subject to the limitations of motion artifact that typically occur on  inpatient MRCP imaging. I speculate that the dilatation may be postinflammatory. 2.  Aortic Atherosclerosis (ICD10-I70.0). Electronically Signed   By: Van Clines M.D.   On: 02/06/2018 18:48   Mr Abdomen Mrcp Moise Boring Contast  Result Date: 02/06/2018 CLINICAL DATA:  Atypical presentation of suspected pancreatitis. Dilated main pancreatic duct. EXAM: MRI ABDOMEN WITHOUT AND WITH CONTRAST (INCLUDING MRCP) TECHNIQUE: Multiplanar multisequence MR imaging of the abdomen was performed both before and after the administration of intravenous contrast. Heavily T2-weighted images of the biliary and pancreatic ducts were obtained, and three-dimensional MRCP images were rendered by post processing. CONTRAST:  59mL MULTIHANCE GADOBENATE DIMEGLUMINE 529 MG/ML IV SOLN COMPARISON:  02/06/2018 FINDINGS: Despite efforts by the technologist and patient, motion artifact is present on today's exam and could not be eliminated. This is the usual result when MRCP is attempted in the inpatient setting where patients are less likely to be able to breath hold and cooperate in controlling motion. This reduces exam sensitivity and specificity. Lower chest: Unremarkable Hepatobiliary: No appreciable focal lesion. No significant biliary dilatation. Gallbladder unremarkable. No obvious filling defect in the biliary tree. Pancreas: Most of the dorsal pancreatic duct is about 2-3 mm in diameter. In the pancreatic head, the duct is slightly more prominent, as shown on recent CT, measuring up to 6 mm. I do not see a mass in the pancreatic head or ampulla to account for this appearance. No pancreas divisum is identified. Spleen:  Unremarkable Adrenals/Urinary Tract:  Unremarkable Stomach/Bowel: Unremarkable Vascular/Lymphatic:  Aortoiliac atherosclerotic vascular disease. Other:  No supplemental non-categorized findings. Musculoskeletal: Unremarkable IMPRESSION: 1. Mildly dilated short segment of the dorsal pancreatic duct in the pancreatic head  period cause is uncertain. I do not see a mass, subject to the limitations of motion artifact that typically occur on inpatient MRCP imaging. I speculate that the dilatation may be postinflammatory. 2.  Aortic Atherosclerosis (ICD10-I70.0). Electronically Signed   By: Van Clines M.D.   On: 02/06/2018 18:48   Xr Lumbar Spine 2-3 Views  Result Date: 02/04/2018 2 views of the lumbar spine: Disc space well maintained.  No significant arthritic changes.  No spondylolisthesis.  No acute fractures normal lordotic curvature maintained  US Abdomen Limited Ruq  Result Date: 02/06/2018 CLINICAL DATA:  Upper abdominal pain with nausea EXAM: ULTRASOUND ABDOMEN LIMITED RIGHT UPPER QUADRANT COMPARISON:  None. FINDINGS: Gallbladder: No gallstones or wall thickening visualized. There is no pericholecystic fluid. No sonographic Murphy sign noted by sonographer. Common bile duct: Diameter: 6 mm. No intrahepatic or extrahepatic biliary duct dilatation evident. Liver: No focal lesion identified. Within normal limits in parenchymal echogenicity. Portal vein is patent on color Doppler imaging with normal direction of blood flow towards the liver. IMPRESSION: Study within normal limits. Electronically Signed   By: Lowella Grip III M.D.   On: 02/06/2018 17:28  Subjective: No complaints this morning. Tolerating po   General = no fevers, chills, dizziness, malaise, fatigue HEENT/EYES = negative for pain, redness, loss of vision, double vision, blurred vision, loss of hearing, sore throat, hoarseness, dysphagia Cardiovascular= negative for chest pain, palpitation, murmurs, lower extremity swelling Respiratory/lungs= negative for shortness of breath, cough, hemoptysis, wheezing, mucus production Gastrointestinal= negative for nausea, vomiting,, abdominal pain, melena, hematemesis Genitourinary= negative for Dysuria, Hematuria, Change in Urinary Frequency MSK = Negative for arthralgia, myalgias, Back Pain,  Joint swelling  Neurology= Negative for headache, seizures, numbness, tingling  Psychiatry= Negative for anxiety, depression, suicidal and homocidal ideation Allergy/Immunology= Medication/Food allergy as listed  Skin= Negative for Rash, lesions, ulcers, itching   Discharge Exam: Vitals:   02/07/18 2057 02/08/18 0425  BP: 114/83 118/81  Pulse: 79 71  Resp:  16  Temp: 98 F (36.7 C) 98.3 F (36.8 C)  SpO2: 95% 96%   Vitals:   02/07/18 0511 02/07/18 1342 02/07/18 2057 02/08/18 0425  BP: 106/76 128/88 114/83 118/81  Pulse: 81 79 79 71  Resp: 18 18  16   Temp: 98.2 F (36.8 C) 98 F (36.7 C) 98 F (36.7 C) 98.3 F (36.8 C)  TempSrc: Oral Oral Oral Oral  SpO2: 94% 99% 95% 96%  Weight:      Height:        General: Pt is alert, awake, not in acute distress Cardiovascular: RRR, S1/S2 +, no rubs, no gallops Respiratory: CTA bilaterally, no wheezing, no rhonchi Abdominal: Soft, NT, ND, bowel sounds + Extremities: no edema, no cyanosis    The results of significant diagnostics from this hospitalization (including imaging, microbiology, ancillary and laboratory) are listed below for reference.     Microbiology: No results found for this or any previous visit (from the past 240 hour(s)).   Labs: BNP (last 3 results) No results for input(s): BNP in the last 8760 hours. Basic Metabolic Panel: Recent Labs  Lab 02/06/18 1009 02/07/18 0615 02/08/18 0543  NA 139 141 139  K 3.7 3.8 3.8  CL 107 110 109  CO2 24 25 24   GLUCOSE 92 96 93  BUN 22* 11 6  CREATININE 0.67 0.71 0.71  CALCIUM 9.7 9.1 8.9  MG  --   --  1.6*   Liver Function Tests: Recent Labs  Lab 02/06/18 1009 02/07/18 0615 02/08/18 0543  AST 56* 31 29  ALT 38 27 24  ALKPHOS 77 58 53  BILITOT 0.5 0.6 0.5  PROT 7.5 6.3* 5.8*  ALBUMIN 3.9 3.3* 3.0*   Recent Labs  Lab 02/06/18 1009  LIPASE 89*   No results for input(s): AMMONIA in the last 168 hours. CBC: Recent Labs  Lab 02/06/18 1009  02/07/18 0615 02/08/18 0543  WBC 8.9 5.6 5.5  NEUTROABS 4.7  --   --   HGB 13.4 11.8* 11.1*  HCT 40.3 35.7* 34.3*  MCV 91.6 92.2 93.2  PLT 211 206 192   Cardiac Enzymes: Recent Labs  Lab 02/06/18 1822 02/07/18 0043 02/07/18 0615  TROPONINI <0.03 <0.03 <0.03   BNP: Invalid input(s): POCBNP CBG: No results for input(s): GLUCAP in the last 168 hours. D-Dimer No results for input(s): DDIMER in the last 72 hours. Hgb A1c No results for input(s): HGBA1C in the last 72 hours. Lipid Profile Recent Labs    02/07/18 0615  CHOL 194  HDL 63  LDLCALC 98  TRIG 164*  CHOLHDL 3.1   Thyroid function studies No results for input(s): TSH, T4TOTAL, T3FREE, THYROIDAB in the last  72 hours.  Invalid input(s): FREET3 Anemia work up No results for input(s): VITAMINB12, FOLATE, FERRITIN, TIBC, IRON, RETICCTPCT in the last 72 hours. Urinalysis    Component Value Date/Time   COLORURINE YELLOW 02/06/2018 1030   APPEARANCEUR CLEAR 02/06/2018 1030   LABSPEC 1.010 02/06/2018 1030   PHURINE 5.0 02/06/2018 1030   GLUCOSEU NEGATIVE 02/06/2018 1030   HGBUR SMALL (A) 02/06/2018 1030   BILIRUBINUR NEGATIVE 02/06/2018 1030   KETONESUR NEGATIVE 02/06/2018 1030   PROTEINUR NEGATIVE 02/06/2018 1030   UROBILINOGEN 0.2 10/10/2014 1221   NITRITE NEGATIVE 02/06/2018 1030   LEUKOCYTESUR NEGATIVE 02/06/2018 1030   Sepsis Labs Invalid input(s): PROCALCITONIN,  WBC,  LACTICIDVEN Microbiology No results found for this or any previous visit (from the past 240 hour(s)).   Time coordinating discharge:  I have spent 35 minutes face to face with the patient and on the ward discussing the patients care, assessment, plan and disposition with other care givers. >50% of the time was devoted counseling the patient about the risks and benefits of treatment/Discharge disposition and coordinating care.   SIGNED:   Damita Lack, MD  [facility] 02/08/2018, 11:14 AM Pager   If 7PM-7AM, please  contact night-coverage [email or web address] Password [email or web address]

## 2018-02-16 ENCOUNTER — Ambulatory Visit (HOSPITAL_COMMUNITY)
Admission: RE | Admit: 2018-02-16 | Discharge: 2018-02-16 | Disposition: A | Discharge status: Home / Self Care | Payer: Medicare Other | Source: Ambulatory Visit | Attending: Physician Assistant | Admitting: Physician Assistant

## 2018-02-16 DIAGNOSIS — M8938 Hypertrophy of bone, other site: Secondary | ICD-10-CM | POA: Diagnosis not present

## 2018-02-16 DIAGNOSIS — M5136 Other intervertebral disc degeneration, lumbar region: Secondary | ICD-10-CM | POA: Insufficient documentation

## 2018-02-16 DIAGNOSIS — M4807 Spinal stenosis, lumbosacral region: Secondary | ICD-10-CM | POA: Diagnosis present

## 2018-02-17 ENCOUNTER — Telehealth (INDEPENDENT_AMBULATORY_CARE_PROVIDER_SITE_OTHER): Payer: Self-pay | Admitting: Orthopaedic Surgery

## 2018-02-17 NOTE — Telephone Encounter (Signed)
Patient called asking for her results from her MRI. CB # [phone]

## 2018-02-17 NOTE — Telephone Encounter (Signed)
Patient aware her appt next Monday is to go over the MRI

## 2018-02-18 ENCOUNTER — Other Ambulatory Visit (INDEPENDENT_AMBULATORY_CARE_PROVIDER_SITE_OTHER): Payer: Self-pay | Admitting: Orthopaedic Surgery

## 2018-02-18 NOTE — Telephone Encounter (Signed)
Rx request 

## 2018-02-20 ENCOUNTER — Ambulatory Visit: Payer: Medicare Other | Admitting: Cardiovascular Disease

## 2018-02-23 ENCOUNTER — Ambulatory Visit (INDEPENDENT_AMBULATORY_CARE_PROVIDER_SITE_OTHER): Payer: Medicare Other | Admitting: Physician Assistant

## 2018-03-23 ENCOUNTER — Other Ambulatory Visit (HOSPITAL_COMMUNITY): Payer: Self-pay | Admitting: Nurse Practitioner

## 2018-03-23 ENCOUNTER — Ambulatory Visit (HOSPITAL_COMMUNITY)
Admission: RE | Admit: 2018-03-23 | Discharge: 2018-03-23 | Disposition: A | Discharge status: Home / Self Care | Payer: Medicare Other | Source: Ambulatory Visit | Attending: Nurse Practitioner | Admitting: Nurse Practitioner

## 2018-03-23 DIAGNOSIS — M25511 Pain in right shoulder: Secondary | ICD-10-CM | POA: Insufficient documentation

## 2018-03-23 DIAGNOSIS — M84422A Pathological fracture, left humerus, initial encounter for fracture: Secondary | ICD-10-CM | POA: Insufficient documentation

## 2018-03-23 DIAGNOSIS — M25512 Pain in left shoulder: Secondary | ICD-10-CM | POA: Insufficient documentation

## 2018-04-02 ENCOUNTER — Other Ambulatory Visit (INDEPENDENT_AMBULATORY_CARE_PROVIDER_SITE_OTHER): Payer: Self-pay | Admitting: Orthopaedic Surgery

## 2018-04-02 NOTE — Telephone Encounter (Signed)
Ok to refill 

## 2018-05-14 ENCOUNTER — Emergency Department (HOSPITAL_COMMUNITY): Payer: Medicare Other

## 2018-05-14 ENCOUNTER — Emergency Department (HOSPITAL_COMMUNITY)
Admission: EM | Admit: 2018-05-14 | Discharge: 2018-05-14 | Disposition: A | Discharge status: Home / Self Care | Payer: Medicare Other | Attending: Emergency Medicine | Admitting: Emergency Medicine

## 2018-05-14 ENCOUNTER — Other Ambulatory Visit: Payer: Self-pay

## 2018-05-14 ENCOUNTER — Encounter (HOSPITAL_COMMUNITY): Payer: Self-pay | Admitting: Emergency Medicine

## 2018-05-14 DIAGNOSIS — F329 Major depressive disorder, single episode, unspecified: Secondary | ICD-10-CM | POA: Insufficient documentation

## 2018-05-14 DIAGNOSIS — Z96643 Presence of artificial hip joint, bilateral: Secondary | ICD-10-CM | POA: Diagnosis not present

## 2018-05-14 DIAGNOSIS — Z79899 Other long term (current) drug therapy: Secondary | ICD-10-CM | POA: Insufficient documentation

## 2018-05-14 DIAGNOSIS — R0789 Other chest pain: Secondary | ICD-10-CM

## 2018-05-14 DIAGNOSIS — I10 Essential (primary) hypertension: Secondary | ICD-10-CM | POA: Insufficient documentation

## 2018-05-14 DIAGNOSIS — F419 Anxiety disorder, unspecified: Secondary | ICD-10-CM | POA: Insufficient documentation

## 2018-05-14 DIAGNOSIS — J45909 Unspecified asthma, uncomplicated: Secondary | ICD-10-CM | POA: Insufficient documentation

## 2018-05-14 DIAGNOSIS — F1010 Alcohol abuse, uncomplicated: Secondary | ICD-10-CM | POA: Insufficient documentation

## 2018-05-14 DIAGNOSIS — R079 Chest pain, unspecified: Secondary | ICD-10-CM | POA: Diagnosis present

## 2018-05-14 DIAGNOSIS — F17210 Nicotine dependence, cigarettes, uncomplicated: Secondary | ICD-10-CM | POA: Insufficient documentation

## 2018-05-14 DIAGNOSIS — F1410 Cocaine abuse, uncomplicated: Secondary | ICD-10-CM | POA: Insufficient documentation

## 2018-05-14 LAB — BASIC METABOLIC PANEL
ANION GAP: 12 (ref 5–15)
BUN: 17 mg/dL (ref 6–20)
CO2: 22 mmol/L (ref 22–32)
Calcium: 10.2 mg/dL (ref 8.9–10.3)
Chloride: 105 mmol/L (ref 98–111)
Creatinine, Ser: 0.82 mg/dL (ref 0.44–1.00)
GFR calc Af Amer: >60 mL/min (ref >60)
GFR calc non Af Amer: >60 mL/min (ref >60)
Glucose, Bld: 109 mg/dL — ABNORMAL HIGH (ref 70–99)
Potassium: 3.6 mmol/L (ref 3.5–5.1)
SODIUM: 139 mmol/L (ref 135–145)

## 2018-05-14 LAB — CBC
HEMATOCRIT: 43.8 % (ref 36.0–46.0)
HEMOGLOBIN: 14.8 g/dL (ref 12.0–15.0)
MCH: 30.7 pg (ref 26.0–34.0)
MCHC: 33.8 g/dL (ref 30.0–36.0)
MCV: 90.9 fL (ref 78.0–100.0)
Platelets: 237 10*3/uL (ref 150–400)
RBC: 4.82 MIL/uL (ref 3.87–5.11)
RDW: 16.8 % — ABNORMAL HIGH (ref 11.5–15.5)
WBC: 10.8 10*3/uL — AB (ref 4.0–10.5)

## 2018-05-14 LAB — TROPONIN I: Troponin I: <0.03 ng/mL (ref <0.03)

## 2018-05-14 MED ORDER — HYDROCODONE-ACETAMINOPHEN 5-325 MG PO TABS: 1.0000 | ORAL_TABLET | Freq: Four times a day (QID) | ORAL | 0 refills | Status: DC | PRN

## 2018-05-14 MED ORDER — OXYCODONE-ACETAMINOPHEN 5-325 MG PO TABS
1.0000 | ORAL_TABLET | Freq: Once | ORAL | Status: AC
  Administered 2018-05-14: 1 via ORAL
  Filled 2018-05-14: qty 1

## 2018-05-14 MED ORDER — HYDROMORPHONE HCL 1 MG/ML IJ SOLN
1.0000 mg | Freq: Once | INTRAMUSCULAR | Status: AC
  Administered 2018-05-14: 1 mg via INTRAVENOUS
  Filled 2018-05-14: qty 1

## 2018-05-14 MED ORDER — ONDANSETRON HCL 4 MG/2ML IJ SOLN
4.0000 mg | Freq: Once | INTRAMUSCULAR | Status: AC
  Administered 2018-05-14: 4 mg via INTRAVENOUS
  Filled 2018-05-14: qty 2

## 2018-05-14 MED ORDER — NAPROXEN 500 MG PO TABS: 500.0000 mg | ORAL_TABLET | Freq: Two times a day (BID) | ORAL | 0 refills | Status: DC

## 2018-05-14 NOTE — ED Notes (Signed)
Have notified MD of patient's pain. Stated he will come to evaluate patient.
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
## 2018-05-14 NOTE — Discharge Instructions (Addendum)
Follow-up with your family doctor as planned 

## 2018-05-14 NOTE — ED Triage Notes (Signed)
PT reports right sided chest/breast pain for about 2 weeks.  States the pain is no worse, but her family made her come be checked.  Reports she is occasionally sob.

## 2018-06-01 ENCOUNTER — Other Ambulatory Visit (HOSPITAL_COMMUNITY): Payer: Self-pay | Admitting: Nurse Practitioner

## 2018-06-01 ENCOUNTER — Ambulatory Visit (HOSPITAL_COMMUNITY)
Admission: RE | Admit: 2018-06-01 | Discharge: 2018-06-01 | Disposition: A | Discharge status: Home / Self Care | Payer: Medicare Other | Source: Ambulatory Visit | Attending: Nurse Practitioner | Admitting: Nurse Practitioner

## 2018-06-01 ENCOUNTER — Other Ambulatory Visit (HOSPITAL_COMMUNITY): Payer: Self-pay | Admitting: Family Medicine

## 2018-06-01 ENCOUNTER — Other Ambulatory Visit (HOSPITAL_COMMUNITY)
Admission: RE | Admit: 2018-06-01 | Discharge: 2018-06-01 | Disposition: A | Discharge status: Home / Self Care | Payer: Medicare Other | Source: Ambulatory Visit | Attending: Nurse Practitioner | Admitting: Nurse Practitioner

## 2018-06-01 DIAGNOSIS — R05 Cough: Secondary | ICD-10-CM | POA: Insufficient documentation

## 2018-06-01 DIAGNOSIS — J9811 Atelectasis: Secondary | ICD-10-CM | POA: Diagnosis not present

## 2018-06-01 DIAGNOSIS — R059 Cough, unspecified: Secondary | ICD-10-CM

## 2018-06-01 LAB — CBC WITH DIFFERENTIAL/PLATELET
Basophils Absolute: 0.1 10*3/uL (ref 0.0–0.1)
Basophils Relative: 1 %
Eosinophils Absolute: 0.7 10*3/uL (ref 0.0–0.7)
Eosinophils Relative: 8 %
HEMATOCRIT: 37.7 % (ref 36.0–46.0)
HEMOGLOBIN: 12.6 g/dL (ref 12.0–15.0)
LYMPHS PCT: 38 %
Lymphs Abs: 3.3 10*3/uL (ref 0.7–4.0)
MCH: 30.5 pg (ref 26.0–34.0)
MCHC: 33.4 g/dL (ref 30.0–36.0)
MCV: 91.3 fL (ref 78.0–100.0)
MONOS PCT: 8 %
Monocytes Absolute: 0.7 10*3/uL (ref 0.1–1.0)
NEUTROS ABS: 3.9 10*3/uL (ref 1.7–7.7)
Neutrophils Relative %: 45 %
Platelets: 242 10*3/uL (ref 150–400)
RBC: 4.13 MIL/uL (ref 3.87–5.11)
RDW: 16.1 % — ABNORMAL HIGH (ref 11.5–15.5)
WBC: 8.7 10*3/uL (ref 4.0–10.5)

## 2018-06-02 ENCOUNTER — Other Ambulatory Visit (HOSPITAL_COMMUNITY): Payer: Self-pay | Admitting: Family Medicine

## 2018-06-02 ENCOUNTER — Other Ambulatory Visit (HOSPITAL_COMMUNITY): Payer: Self-pay | Admitting: Nurse Practitioner

## 2018-06-02 DIAGNOSIS — N644 Mastodynia: Secondary | ICD-10-CM

## 2018-06-02 DIAGNOSIS — Z1231 Encounter for screening mammogram for malignant neoplasm of breast: Secondary | ICD-10-CM

## 2018-06-05 ENCOUNTER — Ambulatory Visit (HOSPITAL_COMMUNITY): Payer: Medicare Other

## 2018-06-08 ENCOUNTER — Ambulatory Visit (HOSPITAL_COMMUNITY): Payer: Medicare Other

## 2018-06-11 ENCOUNTER — Ambulatory Visit (HOSPITAL_COMMUNITY): Payer: Medicare Other

## 2018-06-18 ENCOUNTER — Ambulatory Visit (HOSPITAL_COMMUNITY): Payer: Medicare Other

## 2018-07-30 ENCOUNTER — Encounter (HOSPITAL_COMMUNITY): Payer: Self-pay

## 2018-07-30 ENCOUNTER — Emergency Department (HOSPITAL_COMMUNITY)
Admission: EM | Admit: 2018-07-30 | Discharge: 2018-07-30 | Disposition: A | Discharge status: Home / Self Care | Payer: Medicare Other | Attending: Emergency Medicine | Admitting: Emergency Medicine

## 2018-07-30 ENCOUNTER — Emergency Department (HOSPITAL_COMMUNITY): Payer: Medicare Other

## 2018-07-30 ENCOUNTER — Other Ambulatory Visit: Payer: Self-pay

## 2018-07-30 DIAGNOSIS — R194 Change in bowel habit: Secondary | ICD-10-CM | POA: Insufficient documentation

## 2018-07-30 DIAGNOSIS — R3915 Urgency of urination: Secondary | ICD-10-CM | POA: Insufficient documentation

## 2018-07-30 DIAGNOSIS — R198 Other specified symptoms and signs involving the digestive system and abdomen: Secondary | ICD-10-CM

## 2018-07-30 DIAGNOSIS — Z79899 Other long term (current) drug therapy: Secondary | ICD-10-CM | POA: Insufficient documentation

## 2018-07-30 DIAGNOSIS — F17210 Nicotine dependence, cigarettes, uncomplicated: Secondary | ICD-10-CM | POA: Insufficient documentation

## 2018-07-30 DIAGNOSIS — J45909 Unspecified asthma, uncomplicated: Secondary | ICD-10-CM | POA: Insufficient documentation

## 2018-07-30 DIAGNOSIS — F1290 Cannabis use, unspecified, uncomplicated: Secondary | ICD-10-CM

## 2018-07-30 DIAGNOSIS — R1012 Left upper quadrant pain: Secondary | ICD-10-CM | POA: Diagnosis not present

## 2018-07-30 DIAGNOSIS — F1010 Alcohol abuse, uncomplicated: Secondary | ICD-10-CM | POA: Insufficient documentation

## 2018-07-30 DIAGNOSIS — R112 Nausea with vomiting, unspecified: Secondary | ICD-10-CM | POA: Insufficient documentation

## 2018-07-30 DIAGNOSIS — R109 Unspecified abdominal pain: Secondary | ICD-10-CM

## 2018-07-30 DIAGNOSIS — F1210 Cannabis abuse, uncomplicated: Secondary | ICD-10-CM | POA: Insufficient documentation

## 2018-07-30 DIAGNOSIS — I10 Essential (primary) hypertension: Secondary | ICD-10-CM | POA: Diagnosis not present

## 2018-07-30 LAB — COMPREHENSIVE METABOLIC PANEL
ALBUMIN: 4.7 g/dL (ref 3.5–5.0)
ALT: 21 U/L (ref 0–44)
AST: 31 U/L (ref 15–41)
Alkaline Phosphatase: 81 U/L (ref 38–126)
Anion gap: 11 (ref 5–15)
BUN: 14 mg/dL (ref 6–20)
CHLORIDE: 108 mmol/L (ref 98–111)
CO2: 23 mmol/L (ref 22–32)
CREATININE: 0.67 mg/dL (ref 0.44–1.00)
Calcium: 10.2 mg/dL (ref 8.9–10.3)
GFR calc Af Amer: >60 mL/min (ref >60)
GFR calc non Af Amer: >60 mL/min (ref >60)
Glucose, Bld: 90 mg/dL (ref 70–99)
POTASSIUM: 3.1 mmol/L — AB (ref 3.5–5.1)
SODIUM: 142 mmol/L (ref 135–145)
Total Bilirubin: 0.9 mg/dL (ref 0.3–1.2)
Total Protein: 9 g/dL — ABNORMAL HIGH (ref 6.5–8.1)

## 2018-07-30 LAB — CBC
HEMATOCRIT: 44.5 % (ref 36.0–46.0)
HEMOGLOBIN: 14.5 g/dL (ref 12.0–15.0)
MCH: 29.8 pg (ref 26.0–34.0)
MCHC: 32.6 g/dL (ref 30.0–36.0)
MCV: 91.6 fL (ref 80.0–100.0)
Platelets: 247 10*3/uL (ref 150–400)
RBC: 4.86 MIL/uL (ref 3.87–5.11)
RDW: 14.7 % (ref 11.5–15.5)
WBC: 8 10*3/uL (ref 4.0–10.5)
nRBC: 0 % (ref 0.0–0.2)

## 2018-07-30 LAB — URINALYSIS, ROUTINE W REFLEX MICROSCOPIC
BACTERIA UA: NONE SEEN
Bilirubin Urine: NEGATIVE
Glucose, UA: NEGATIVE mg/dL
Ketones, ur: NEGATIVE mg/dL
Nitrite: NEGATIVE
Protein, ur: 30 mg/dL — AB
Specific Gravity, Urine: 1.027 (ref 1.005–1.030)
pH: 5 (ref 5.0–8.0)

## 2018-07-30 LAB — POC OCCULT BLOOD, ED: FECAL OCCULT BLD: NEGATIVE

## 2018-07-30 LAB — PREGNANCY, URINE: Preg Test, Ur: NEGATIVE

## 2018-07-30 LAB — LIPASE, BLOOD: LIPASE: 66 U/L — AB (ref 11–51)

## 2018-07-30 MED ORDER — SODIUM CHLORIDE 0.9 % IV BOLUS
1000.0000 mL | Freq: Once | INTRAVENOUS | Status: AC
  Administered 2018-07-30: 1000 mL via INTRAVENOUS

## 2018-07-30 MED ORDER — HALOPERIDOL LACTATE 5 MG/ML IJ SOLN
2.5000 mg | Freq: Once | INTRAMUSCULAR | Status: AC
  Administered 2018-07-30: 2.5 mg via INTRAVENOUS
  Filled 2018-07-30: qty 1

## 2018-07-30 MED ORDER — THIAMINE HCL 100 MG/ML IJ SOLN
100.0000 mg | Freq: Once | INTRAMUSCULAR | Status: AC
  Administered 2018-07-30: 100 mg via INTRAVENOUS
  Filled 2018-07-30: qty 2

## 2018-07-30 MED ORDER — FOLIC ACID 5 MG/ML IJ SOLN
1.0000 mg | Freq: Once | INTRAMUSCULAR | Status: AC
  Administered 2018-07-30: 1 mg via INTRAVENOUS
  Filled 2018-07-30: qty 0.2

## 2018-07-30 MED ORDER — MORPHINE SULFATE (PF) 4 MG/ML IV SOLN
4.0000 mg | Freq: Once | INTRAVENOUS | Status: AC
  Administered 2018-07-30: 4 mg via INTRAVENOUS
  Filled 2018-07-30: qty 1

## 2018-07-30 MED ORDER — ONDANSETRON HCL 4 MG PO TABS: 4.0000 mg | ORAL_TABLET | Freq: Four times a day (QID) | ORAL | 0 refills | Status: DC

## 2018-07-30 MED ORDER — KETOROLAC TROMETHAMINE 30 MG/ML IJ SOLN
30.0000 mg | Freq: Once | INTRAMUSCULAR | Status: AC
  Administered 2018-07-30: 30 mg via INTRAVENOUS
  Filled 2018-07-30: qty 1

## 2018-07-30 MED ORDER — CEPHALEXIN 500 MG PO CAPS: 500.0000 mg | ORAL_CAPSULE | Freq: Four times a day (QID) | ORAL | 0 refills | Status: DC

## 2018-07-30 MED ORDER — ONDANSETRON HCL 4 MG/2ML IJ SOLN
4.0000 mg | Freq: Once | INTRAMUSCULAR | Status: AC
  Administered 2018-07-30: 4 mg via INTRAVENOUS
  Filled 2018-07-30: qty 2
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
## 2018-07-30 NOTE — ED Triage Notes (Signed)
Pt reports left sided abd pain, n/v/d since yesterday.  Pt also c/o pain in r breast for the past couple of months.  Pt says she was supposed to have a mammogram but hasn't been able to get it.

## 2018-07-30 NOTE — Discharge Instructions (Signed)
You are seen in the ER for nausea, vomiting, abdominal pain, changes in your bowel movements.  Your urine had some inflammatory cells and blood.  Given that you had some urinary tract infection symptoms and some pain in your left kidney, we will treat you with antibiotic for a possible bladder or kidney infection.  Take this antibiotic as prescribed and until completed.  For pain take 500 to 1000 mg of acetaminophen every 6-8 hours.  Avoid ibuprofen containing products because this can irritate stomach.  Use Zofran for nausea.  Considering cutting back on your alcohol and marijuana use.  Long-term, this can cause liver issues and chronic symptoms such as nausea, vomiting, abdominal pain, sweats.

## 2018-07-31 LAB — URINE CULTURE

## 2018-08-05 ENCOUNTER — Other Ambulatory Visit (HOSPITAL_COMMUNITY): Payer: Self-pay | Admitting: Family Medicine

## 2018-08-05 DIAGNOSIS — N644 Mastodynia: Secondary | ICD-10-CM

## 2018-08-10 ENCOUNTER — Other Ambulatory Visit (HOSPITAL_COMMUNITY): Payer: Self-pay | Admitting: Family Medicine

## 2018-08-10 DIAGNOSIS — N644 Mastodynia: Secondary | ICD-10-CM

## 2018-08-25 ENCOUNTER — Ambulatory Visit (HOSPITAL_COMMUNITY): Admission: RE | Admit: 2018-08-25 | Payer: Medicare Other | Source: Ambulatory Visit

## 2018-08-25 ENCOUNTER — Ambulatory Visit (HOSPITAL_COMMUNITY): Payer: Medicare Other

## 2018-08-25 ENCOUNTER — Ambulatory Visit (HOSPITAL_COMMUNITY)
Admission: RE | Admit: 2018-08-25 | Discharge: 2018-08-25 | Disposition: A | Discharge status: Home / Self Care | Payer: Medicare Other | Source: Ambulatory Visit | Attending: Family Medicine | Admitting: Family Medicine

## 2018-08-25 DIAGNOSIS — N644 Mastodynia: Secondary | ICD-10-CM | POA: Insufficient documentation

## 2018-08-26 ENCOUNTER — Other Ambulatory Visit (HOSPITAL_COMMUNITY): Payer: Self-pay | Admitting: Family Medicine

## 2018-08-26 DIAGNOSIS — M542 Cervicalgia: Secondary | ICD-10-CM

## 2018-09-02 ENCOUNTER — Ambulatory Visit (HOSPITAL_COMMUNITY)
Admission: RE | Admit: 2018-09-02 | Discharge: 2018-09-02 | Disposition: A | Discharge status: Home / Self Care | Payer: Medicare Other | Source: Ambulatory Visit | Attending: Family Medicine | Admitting: Family Medicine

## 2018-09-02 DIAGNOSIS — M542 Cervicalgia: Secondary | ICD-10-CM | POA: Insufficient documentation

## 2018-09-17 ENCOUNTER — Ambulatory Visit (INDEPENDENT_AMBULATORY_CARE_PROVIDER_SITE_OTHER): Payer: Medicare Other | Admitting: Internal Medicine

## 2018-09-17 ENCOUNTER — Encounter (INDEPENDENT_AMBULATORY_CARE_PROVIDER_SITE_OTHER): Payer: Self-pay | Admitting: Internal Medicine

## 2018-09-17 VITALS — BP 130/100 | HR 88 | Temp 97.6°F | Ht 65.0 in | Wt 122.2 lb

## 2018-09-17 DIAGNOSIS — K8500 Idiopathic acute pancreatitis without necrosis or infection: Secondary | ICD-10-CM | POA: Diagnosis not present

## 2018-09-17 DIAGNOSIS — K921 Melena: Secondary | ICD-10-CM | POA: Diagnosis not present

## 2018-09-17 NOTE — Patient Instructions (Addendum)
Labs and 3 stools cards sent home with patient.
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
## 2018-09-18 LAB — LIPASE: LIPASE: 91 U/L — AB (ref 7–60)

## 2018-09-22 ENCOUNTER — Telehealth (INDEPENDENT_AMBULATORY_CARE_PROVIDER_SITE_OTHER): Payer: Self-pay | Admitting: Internal Medicine

## 2018-09-22 NOTE — Telephone Encounter (Signed)
Patient came by office dropped off stool sample - stated her stomach is swollen - would like a call back at [phone]

## 2018-09-23 NOTE — Telephone Encounter (Signed)
Patient stated her abdomen was distended. Looks like she is pregnant. I advised her to go ahead and go to the ED and be evaluated.

## 2018-09-25 ENCOUNTER — Telehealth (INDEPENDENT_AMBULATORY_CARE_PROVIDER_SITE_OTHER): Payer: Self-pay | Admitting: *Deleted

## 2018-09-25 NOTE — Telephone Encounter (Signed)
   Diagnosis:    Result(s)   Card 1:Negative:     Card 2:Negative:   Card 3:Negative:    Completed by: Thomas Hoff, LPN   HEMOCCULT SENSA DEVELOPER: LOT#:  [id number] EXPIRATION DATE: 2020-05   HEMOCCULT SENSA CARD:  LOT#:  [id number] EXPIRATION DATE: 08/21   CARD CONTROL RESULTS:  POSITIVE: Positive NEGATIVE: Negative    ADDITIONAL COMMENTS: Results forwarded to the ordering provider, Lelon Perla.   Patient has not been given results.

## 2018-09-28 ENCOUNTER — Other Ambulatory Visit: Payer: Self-pay | Admitting: Nurse Practitioner

## 2018-09-28 DIAGNOSIS — M542 Cervicalgia: Secondary | ICD-10-CM

## 2018-09-30 NOTE — Telephone Encounter (Signed)
Results given to patient

## 2018-09-30 NOTE — Telephone Encounter (Signed)
Has not drank anything since her OV.

## 2018-10-06 ENCOUNTER — Ambulatory Visit
Admission: RE | Admit: 2018-10-06 | Discharge: 2018-10-06 | Disposition: A | Discharge status: Home / Self Care | Payer: Medicare Other | Source: Ambulatory Visit | Attending: Nurse Practitioner | Admitting: Nurse Practitioner

## 2018-10-06 DIAGNOSIS — M542 Cervicalgia: Secondary | ICD-10-CM

## 2018-10-06 MED ORDER — TRIAMCINOLONE ACETONIDE 40 MG/ML IJ SUSP (RADIOLOGY)
60.0000 mg | Freq: Once | INTRAMUSCULAR | Status: AC
  Administered 2018-10-06: 60 mg via EPIDURAL

## 2018-10-06 MED ORDER — IOPAMIDOL (ISOVUE-M 300) INJECTION 61%
1.0000 mL | Freq: Once | INTRAMUSCULAR | Status: AC | PRN
  Administered 2018-10-06: 1 mL via EPIDURAL

## 2018-10-06 NOTE — Discharge Instructions (Signed)

## 2019-01-17 ENCOUNTER — Encounter (HOSPITAL_COMMUNITY): Payer: Self-pay | Admitting: Emergency Medicine

## 2019-01-17 ENCOUNTER — Observation Stay (HOSPITAL_COMMUNITY)
Admission: EM | Admit: 2019-01-17 | Discharge: 2019-01-18 | Disposition: A | Discharge status: Home / Self Care | Payer: Medicare Other | Attending: Family Medicine | Admitting: Family Medicine

## 2019-01-17 ENCOUNTER — Other Ambulatory Visit: Payer: Self-pay

## 2019-01-17 ENCOUNTER — Inpatient Hospital Stay (HOSPITAL_COMMUNITY): Payer: Medicare Other

## 2019-01-17 DIAGNOSIS — Z720 Tobacco use: Secondary | ICD-10-CM | POA: Diagnosis present

## 2019-01-17 DIAGNOSIS — K219 Gastro-esophageal reflux disease without esophagitis: Secondary | ICD-10-CM | POA: Insufficient documentation

## 2019-01-17 DIAGNOSIS — Z885 Allergy status to narcotic agent status: Secondary | ICD-10-CM | POA: Insufficient documentation

## 2019-01-17 DIAGNOSIS — K8689 Other specified diseases of pancreas: Secondary | ICD-10-CM | POA: Insufficient documentation

## 2019-01-17 DIAGNOSIS — Z79899 Other long term (current) drug therapy: Secondary | ICD-10-CM | POA: Diagnosis not present

## 2019-01-17 DIAGNOSIS — D649 Anemia, unspecified: Secondary | ICD-10-CM | POA: Insufficient documentation

## 2019-01-17 DIAGNOSIS — J449 Chronic obstructive pulmonary disease, unspecified: Secondary | ICD-10-CM | POA: Diagnosis not present

## 2019-01-17 DIAGNOSIS — K859 Acute pancreatitis without necrosis or infection, unspecified: Secondary | ICD-10-CM | POA: Diagnosis present

## 2019-01-17 DIAGNOSIS — K852 Alcohol induced acute pancreatitis without necrosis or infection: Principal | ICD-10-CM | POA: Insufficient documentation

## 2019-01-17 DIAGNOSIS — I10 Essential (primary) hypertension: Secondary | ICD-10-CM | POA: Insufficient documentation

## 2019-01-17 DIAGNOSIS — F1410 Cocaine abuse, uncomplicated: Secondary | ICD-10-CM | POA: Insufficient documentation

## 2019-01-17 DIAGNOSIS — F17210 Nicotine dependence, cigarettes, uncomplicated: Secondary | ICD-10-CM | POA: Diagnosis not present

## 2019-01-17 DIAGNOSIS — F1020 Alcohol dependence, uncomplicated: Secondary | ICD-10-CM | POA: Diagnosis not present

## 2019-01-17 DIAGNOSIS — Z1159 Encounter for screening for other viral diseases: Secondary | ICD-10-CM | POA: Insufficient documentation

## 2019-01-17 DIAGNOSIS — Z96643 Presence of artificial hip joint, bilateral: Secondary | ICD-10-CM | POA: Diagnosis not present

## 2019-01-17 DIAGNOSIS — F1010 Alcohol abuse, uncomplicated: Secondary | ICD-10-CM | POA: Diagnosis not present

## 2019-01-17 DIAGNOSIS — F419 Anxiety disorder, unspecified: Secondary | ICD-10-CM | POA: Diagnosis not present

## 2019-01-17 DIAGNOSIS — R112 Nausea with vomiting, unspecified: Secondary | ICD-10-CM

## 2019-01-17 DIAGNOSIS — Z8249 Family history of ischemic heart disease and other diseases of the circulatory system: Secondary | ICD-10-CM | POA: Diagnosis not present

## 2019-01-17 DIAGNOSIS — F329 Major depressive disorder, single episode, unspecified: Secondary | ICD-10-CM | POA: Diagnosis not present

## 2019-01-17 DIAGNOSIS — Z881 Allergy status to other antibiotic agents status: Secondary | ICD-10-CM | POA: Diagnosis not present

## 2019-01-17 DIAGNOSIS — Z7951 Long term (current) use of inhaled steroids: Secondary | ICD-10-CM | POA: Diagnosis not present

## 2019-01-17 DIAGNOSIS — Z882 Allergy status to sulfonamides status: Secondary | ICD-10-CM | POA: Insufficient documentation

## 2019-01-17 LAB — URINALYSIS, ROUTINE W REFLEX MICROSCOPIC
Bacteria, UA: NONE SEEN
Bilirubin Urine: NEGATIVE
Glucose, UA: NEGATIVE mg/dL
Ketones, ur: NEGATIVE mg/dL
Leukocytes,Ua: NEGATIVE
Nitrite: NEGATIVE
Protein, ur: NEGATIVE mg/dL
Specific Gravity, Urine: 1.013 (ref 1.005–1.030)
pH: 5 (ref 5.0–8.0)

## 2019-01-17 LAB — CBC WITH DIFFERENTIAL/PLATELET
Abs Immature Granulocytes: 0.02 10*3/uL (ref 0.00–0.07)
Basophils Absolute: 0.1 10*3/uL (ref 0.0–0.1)
Basophils Relative: 1 %
Eosinophils Absolute: 0.2 10*3/uL (ref 0.0–0.5)
Eosinophils Relative: 2 %
HCT: 34.9 % — ABNORMAL LOW (ref 36.0–46.0)
Hemoglobin: 11.6 g/dL — ABNORMAL LOW (ref 12.0–15.0)
Immature Granulocytes: 0 %
Lymphocytes Relative: 47 %
Lymphs Abs: 4.8 10*3/uL — ABNORMAL HIGH (ref 0.7–4.0)
MCH: 30.7 pg (ref 26.0–34.0)
MCHC: 33.2 g/dL (ref 30.0–36.0)
MCV: 92.3 fL (ref 80.0–100.0)
Monocytes Absolute: 1 10*3/uL (ref 0.1–1.0)
Monocytes Relative: 10 %
Neutro Abs: 4.1 10*3/uL (ref 1.7–7.7)
Neutrophils Relative %: 40 %
Platelets: 224 10*3/uL (ref 150–400)
RBC: 3.78 MIL/uL — ABNORMAL LOW (ref 3.87–5.11)
RDW: 15 % (ref 11.5–15.5)
WBC: 10.1 10*3/uL (ref 4.0–10.5)
nRBC: 0 % (ref 0.0–0.2)

## 2019-01-17 LAB — COMPREHENSIVE METABOLIC PANEL
ALT: 17 U/L (ref 0–44)
AST: 24 U/L (ref 15–41)
Albumin: 4 g/dL (ref 3.5–5.0)
Alkaline Phosphatase: 68 U/L (ref 38–126)
Anion gap: 9 (ref 5–15)
BUN: 23 mg/dL — ABNORMAL HIGH (ref 6–20)
CO2: 21 mmol/L — ABNORMAL LOW (ref 22–32)
Calcium: 9.4 mg/dL (ref 8.9–10.3)
Chloride: 112 mmol/L — ABNORMAL HIGH (ref 98–111)
Creatinine, Ser: 0.83 mg/dL (ref 0.44–1.00)
GFR calc Af Amer: >60 mL/min (ref >60)
GFR calc non Af Amer: >60 mL/min (ref >60)
Glucose, Bld: 94 mg/dL (ref 70–99)
Potassium: 3.5 mmol/L (ref 3.5–5.1)
Sodium: 142 mmol/L (ref 135–145)
Total Bilirubin: 0.3 mg/dL (ref 0.3–1.2)
Total Protein: 7.5 g/dL (ref 6.5–8.1)

## 2019-01-17 LAB — SARS CORONAVIRUS 2 BY RT PCR (HOSPITAL ORDER, PERFORMED IN ~~LOC~~ HOSPITAL LAB): SARS Coronavirus 2: NEGATIVE

## 2019-01-17 LAB — LIPASE, BLOOD: Lipase: 118 U/L — ABNORMAL HIGH (ref 11–51)

## 2019-01-17 MED ORDER — ONDANSETRON HCL 4 MG PO TABS
4.0000 mg | ORAL_TABLET | Freq: Four times a day (QID) | ORAL | Status: DC | PRN
  Filled 2019-01-17: qty 1

## 2019-01-17 MED ORDER — POTASSIUM CHLORIDE IN NACL 20-0.9 MEQ/L-% IV SOLN
INTRAVENOUS | Status: DC
  Administered 2019-01-17 – 2019-01-18 (×3): via INTRAVENOUS

## 2019-01-17 MED ORDER — PROMETHAZINE HCL 12.5 MG PO TABS: 12.5000 mg | ORAL_TABLET | Freq: Four times a day (QID) | ORAL | Status: DC | PRN

## 2019-01-17 MED ORDER — PROMETHAZINE HCL 25 MG/ML IJ SOLN
12.5000 mg | Freq: Once | INTRAMUSCULAR | Status: AC
  Administered 2019-01-17: 12.5 mg via INTRAVENOUS
  Filled 2019-01-17: qty 1

## 2019-01-17 MED ORDER — IOHEXOL 300 MG/ML  SOLN
30.0000 mL | Freq: Once | INTRAMUSCULAR | Status: AC | PRN
  Administered 2019-01-17: 30 mL via ORAL

## 2019-01-17 MED ORDER — IOHEXOL 300 MG/ML  SOLN
100.0000 mL | Freq: Once | INTRAMUSCULAR | Status: AC | PRN
  Administered 2019-01-17: 100 mL via INTRAVENOUS

## 2019-01-17 MED ORDER — HYDRALAZINE HCL 20 MG/ML IJ SOLN: 5.0000 mg | INTRAMUSCULAR | Status: DC | PRN

## 2019-01-17 MED ORDER — ACETAMINOPHEN 325 MG PO TABS: 650.0000 mg | ORAL_TABLET | Freq: Four times a day (QID) | ORAL | Status: DC | PRN

## 2019-01-17 MED ORDER — HYDROMORPHONE HCL 1 MG/ML IJ SOLN
0.5000 mg | Freq: Once | INTRAMUSCULAR | Status: AC
  Administered 2019-01-17: 0.5 mg via INTRAVENOUS
  Filled 2019-01-17: qty 1

## 2019-01-17 MED ORDER — SERTRALINE HCL 50 MG PO TABS
100.0000 mg | ORAL_TABLET | Freq: Every day | ORAL | Status: DC
  Administered 2019-01-18: 100 mg via ORAL
  Filled 2019-01-17: qty 2

## 2019-01-17 MED ORDER — IPRATROPIUM-ALBUTEROL 0.5-2.5 (3) MG/3ML IN SOLN
3.0000 mL | Freq: Three times a day (TID) | RESPIRATORY_TRACT | Status: DC
  Administered 2019-01-17 – 2019-01-18 (×2): 3 mL via RESPIRATORY_TRACT
  Filled 2019-01-17 (×2): qty 3

## 2019-01-17 MED ORDER — ALBUTEROL SULFATE (2.5 MG/3ML) 0.083% IN NEBU: 2.5000 mg | INHALATION_SOLUTION | RESPIRATORY_TRACT | Status: DC | PRN

## 2019-01-17 MED ORDER — MONTELUKAST SODIUM 10 MG PO TABS
10.0000 mg | ORAL_TABLET | Freq: Every day | ORAL | Status: DC
  Administered 2019-01-17: 10 mg via ORAL
  Filled 2019-01-17: qty 1

## 2019-01-17 MED ORDER — ACETAMINOPHEN 650 MG RE SUPP: 650.0000 mg | Freq: Four times a day (QID) | RECTAL | Status: DC | PRN

## 2019-01-17 MED ORDER — SODIUM CHLORIDE 0.9 % IV BOLUS
1000.0000 mL | Freq: Once | INTRAVENOUS | Status: AC
  Administered 2019-01-17: 1000 mL via INTRAVENOUS

## 2019-01-17 MED ORDER — TRAZODONE HCL 50 MG PO TABS
50.0000 mg | ORAL_TABLET | Freq: Every day | ORAL | Status: DC
  Administered 2019-01-17: 50 mg via ORAL
  Filled 2019-01-17: qty 1

## 2019-01-17 MED ORDER — HYDROMORPHONE HCL 1 MG/ML IJ SOLN
0.5000 mg | INTRAMUSCULAR | Status: DC | PRN
  Administered 2019-01-17 – 2019-01-18 (×4): 0.5 mg via INTRAVENOUS
  Filled 2019-01-17 (×4): qty 0.5

## 2019-01-17 MED ORDER — HYDROMORPHONE HCL 1 MG/ML IJ SOLN
0.5000 mg | Freq: Once | INTRAMUSCULAR | Status: AC
  Administered 2019-01-17: 0.5 mg via INTRAVENOUS
  Filled 2019-01-17: qty 0.5

## 2019-01-17 MED ORDER — LORAZEPAM 0.5 MG PO TABS
0.5000 mg | ORAL_TABLET | ORAL | Status: DC | PRN
  Administered 2019-01-17 – 2019-01-18 (×3): 0.5 mg via ORAL
  Filled 2019-01-17 (×3): qty 1

## 2019-01-17 MED ORDER — AMLODIPINE BESYLATE 5 MG PO TABS
10.0000 mg | ORAL_TABLET | Freq: Every day | ORAL | Status: DC
  Administered 2019-01-18: 10 mg via ORAL
  Filled 2019-01-17: qty 2

## 2019-01-17 MED ORDER — ENOXAPARIN SODIUM 40 MG/0.4ML ~~LOC~~ SOLN
40.0000 mg | SUBCUTANEOUS | Status: DC
  Administered 2019-01-17: 40 mg via SUBCUTANEOUS
  Filled 2019-01-17: qty 0.4

## 2019-01-17 MED ORDER — ONDANSETRON HCL 4 MG/2ML IJ SOLN
4.0000 mg | Freq: Four times a day (QID) | INTRAMUSCULAR | Status: DC | PRN
  Filled 2019-01-17: qty 2

## 2019-01-17 NOTE — ED Notes (Signed)
Patient drank 6 oz of water. Patient vomited up approximately 2 oz of clear yellow emesis.
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
## 2019-01-17 NOTE — ED Triage Notes (Signed)
Pt states her abd has been swollen with a lot of pain for over a week.  No vomiting or diarrhea.

## 2019-01-18 ENCOUNTER — Observation Stay (HOSPITAL_COMMUNITY): Payer: Medicare Other

## 2019-01-18 DIAGNOSIS — Z720 Tobacco use: Secondary | ICD-10-CM | POA: Diagnosis not present

## 2019-01-18 DIAGNOSIS — F1010 Alcohol abuse, uncomplicated: Secondary | ICD-10-CM | POA: Diagnosis not present

## 2019-01-18 DIAGNOSIS — J449 Chronic obstructive pulmonary disease, unspecified: Secondary | ICD-10-CM | POA: Diagnosis not present

## 2019-01-18 DIAGNOSIS — K852 Alcohol induced acute pancreatitis without necrosis or infection: Secondary | ICD-10-CM | POA: Diagnosis not present

## 2019-01-18 LAB — LIPASE, BLOOD: Lipase: 39 U/L (ref 11–51)

## 2019-01-18 LAB — CBC WITH DIFFERENTIAL/PLATELET
Abs Immature Granulocytes: 0.02 10*3/uL (ref 0.00–0.07)
Basophils Absolute: 0.1 10*3/uL (ref 0.0–0.1)
Basophils Relative: 1 %
Eosinophils Absolute: 0.3 10*3/uL (ref 0.0–0.5)
Eosinophils Relative: 4 %
HCT: 41.6 % (ref 36.0–46.0)
Hemoglobin: 12.8 g/dL (ref 12.0–15.0)
Immature Granulocytes: 0 %
Lymphocytes Relative: 58 %
Lymphs Abs: 4.3 10*3/uL — ABNORMAL HIGH (ref 0.7–4.0)
MCH: 30.2 pg (ref 26.0–34.0)
MCHC: 30.8 g/dL (ref 30.0–36.0)
MCV: 98.1 fL (ref 80.0–100.0)
Monocytes Absolute: 0.6 10*3/uL (ref 0.1–1.0)
Monocytes Relative: 8 %
Neutro Abs: 2.2 10*3/uL (ref 1.7–7.7)
Neutrophils Relative %: 29 %
Platelets: 194 10*3/uL (ref 150–400)
RBC: 4.24 MIL/uL (ref 3.87–5.11)
RDW: 15.2 % (ref 11.5–15.5)
WBC: 7.4 10*3/uL (ref 4.0–10.5)
nRBC: 0 % (ref 0.0–0.2)

## 2019-01-18 LAB — COMPREHENSIVE METABOLIC PANEL
ALT: 14 U/L (ref 0–44)
AST: 25 U/L (ref 15–41)
Albumin: 3.6 g/dL (ref 3.5–5.0)
Alkaline Phosphatase: 68 U/L (ref 38–126)
Anion gap: 9 (ref 5–15)
BUN: 11 mg/dL (ref 6–20)
CO2: 21 mmol/L — ABNORMAL LOW (ref 22–32)
Calcium: 9.2 mg/dL (ref 8.9–10.3)
Chloride: 114 mmol/L — ABNORMAL HIGH (ref 98–111)
Creatinine, Ser: 0.85 mg/dL (ref 0.44–1.00)
GFR calc Af Amer: >60 mL/min (ref >60)
GFR calc non Af Amer: >60 mL/min (ref >60)
Glucose, Bld: 83 mg/dL (ref 70–99)
Potassium: 4.3 mmol/L (ref 3.5–5.1)
Sodium: 144 mmol/L (ref 135–145)
Total Bilirubin: 0.7 mg/dL (ref 0.3–1.2)
Total Protein: 7 g/dL (ref 6.5–8.1)

## 2019-01-18 LAB — MAGNESIUM: Magnesium: 2 mg/dL (ref 1.7–2.4)

## 2019-01-18 MED ORDER — LOSARTAN POTASSIUM 50 MG PO TABS
25.0000 mg | ORAL_TABLET | Freq: Every day | ORAL | Status: DC
  Administered 2019-01-18: 25 mg via ORAL
  Filled 2019-01-18: qty 1

## 2019-01-18 MED ORDER — FAMOTIDINE 20 MG PO TABS: 20.0000 mg | ORAL_TABLET | Freq: Every evening | ORAL | 0 refills | Status: DC

## 2019-01-18 MED ORDER — POLYETHYLENE GLYCOL 3350 17 G PO PACK: 17.0000 g | PACK | Freq: Every day | ORAL | 0 refills | Status: AC

## 2019-01-18 NOTE — Discharge Instructions (Signed)
Acute Pancreatitis  Acute pancreatitis happens when the pancreas gets swollen. The pancreas is a large gland behind the stomach. The pancreas helps control blood sugar. It also makes enzymes that help digest food. This condition happens when the enzymes attack the pancreas and damage it. Most attacks last a couple of days and are dangerous. The lungs, heart, and kidneys may stop working. What are the causes?  Alcohol abuse.  Drug abuse.  Gallstones.  Some medicines.  Some chemicals.  Infection.  Damage caused by an accident.Sarah Ochoa (abdominal) surgery.  In some cases, the cause is not known. What are the signs or symptoms?  Pain in the upper belly and back.  Swelling of the belly  Feeling sick to your stomach (nausea) and throwing up (vomiting). How is this treated?  You will probably have to stay in the hospital. ? Treatment may include:  Fluid through an IV.  A tube to remove stomach contents and stop you from throwing up.  Not eating for 3-4 days.  Pain medicine.  Antibiotic medicines if you have an infection.  Surgery on the pancreas or gallbladder. Follow these instructions at home: Eating and drinking   Follow instructions from your doctor about diet.  Eat small meals often. Avoid eating big meals.  Eat foods that do not have a lot of fat in them.  Drink enough fluid to keep your pee (urine) pale yellow.  Do not drink alcohol if it caused your condition. General instructions  Take over-the-counter and prescription medicines only as told by your doctor.  Do not use cigarettes, e-cigarettes, and chewing tobacco. If you need help quitting, ask your doctor.  Get plenty of rest.  If directed, check your blood sugar at home as told by your doctor.  Keep all follow-up visits as told by your doctor. This is important. Contact a doctor if:  You do not get better as quickly as expected.  You have new symptoms.  Your symptoms get worse.  You  have lasting pain or weakness.  You continue to feel sick to your stomach.  You get better and then you have another pain attack.  You have a fever. Get help right away if:  You cannot eat or keep fluids down.  Your pain becomes very bad.  Your skin or the white part of your eyes turns yellow.  You throw up.  You feel dizzy or you pass out.  Your blood sugar is high (over 300 mg/dL). Summary  Acute pancreatitis happens when the pancreas gets swollen.  This condition is usually caused by alcohol abuse, drug abuse, or gallstones.  You will probably have to stay in the hospital for treatment. This information is not intended to replace advice given to you by your health care provider. Make sure you discuss any questions you have with your health care provider. Document Released: 02/19/2008 Document Revised: 01/06/2017 Document Reviewed: 06/06/2015 Elsevier Interactive Patient Education  2019 [place].       Pancreatitis Eating Plan Pancreatitis is when your pancreas becomes irritated and swollen (inflamed). The pancreas is a small organ located behind your stomach. It helps your body digest food and regulate your blood sugar. Pancreatitis can affect how your body digests food, especially foods with fat. You may also have other symptoms such as abdominal pain or nausea. When you have pancreatitis, following a low-fat eating plan may help you manage symptoms and recover more quickly. Work with your health care provider or a diet and nutrition specialist (  dietitian) to create an eating plan that is right for you. What are tips for following this plan? Reading food labels Use the information on food labels to help keep track of how much fat you eat:  Check the serving size.  Look for the amount of total fat in grams (g) in one serving. ? Low-fat foods have 3 g of fat or less per serving. ? Fat-free foods have 0.5 g of fat or less per serving.  Keep track of how much fat  you eat based on how many servings you eat. ? For example, if you eat two servings, the amount of fat you eat will be two times what is listed on the label. Shopping   Buy low-fat or nonfat foods, such as: ? Fresh, frozen, or canned fruits and vegetables. ? Grains, including pasta, bread, and rice. ? Lean meat, poultry, fish, and other protein foods. ? Low-fat or nonfat dairy.  Avoid buying bakery products and other sweets made with whole milk, butter, and eggs.  Avoid buying snack foods with added fat, such as anything with butter or cheese flavoring. Cooking  Remove skin from poultry, and remove extra fat from meat.  Limit the amount of fat and oil you use to 6 teaspoons or less per day.  Cook using low-fat methods, such as boiling, broiling, grilling, steaming, or baking.  Use spray oil to cook. Add fat-free chicken broth to add flavor and moisture.  Avoid adding cream to thicken soups or sauces. Use other thickeners such as corn starch or tomato paste. Meal planning   Eat a low-fat diet as told by your dietitian. For most people, this means having no more than 55-65 grams of fat each day.  Eat small, frequent meals throughout the day. For example, you may have 5-6 small meals instead of 3 large meals.  Drink enough fluid to keep your urine pale yellow.  Do not drink alcohol. Talk to your health care provider if you need help stopping.  Limit how much caffeine you have, including black coffee, black and green tea, caffeinated soft drinks, and energy drinks. General information  Let your health care provider or dietitian know if you have unplanned weight loss on this eating plan.  You may be instructed to follow a clear liquid diet during a flare of symptoms. Talk with your health care provider about how to manage your diet during symptoms of a flare.  Take any vitamins or supplements as told by your health care provider.  Work with a Microbiologist, especially if you have  other conditions such as obesity or diabetes mellitus. What foods should I avoid? Fruits Fried fruits. Fruits served with butter or cream. Vegetables Fried vegetables. Vegetables cooked with butter, cheese, or cream. Grains Biscuits, waffles, donuts, pastries, and croissants. Pies and cookies. Butter-flavored popcorn. Regular crackers. Meats and other protein foods Fatty cuts of meat. Poultry with skin. Organ meats. Bacon, sausage, and cold cuts. Whole eggs. Nuts and nut butters. Dairy Whole and 2% milk. Whole milk yogurt. Whole milk ice cream. Cream and half-and-half. Cream cheese. Sour cream. Cheese. Beverages Wine, beer, and liquor. The items listed above may not be a complete list of foods and beverages to avoid. Contact a dietitian for more information. Summary  Pancreatitis can affect how your body digests food, especially foods with fat.  When you have pancreatitis, it is recommended that you follow a low-fat eating plan to help you recover more quickly and manage symptoms. For most people, this means  limiting fat to no more than 55-65 grams per day.  Do not drink alcohol. Limit the amount of caffeine you have, and drink enough fluid to keep your urine pale yellow. This information is not intended to replace advice given to you by your health care provider. Make sure you discuss any questions you have with your health care provider. Document Released: 12/09/2017 Document Revised: 12/09/2017 Document Reviewed: 12/09/2017 Elsevier Interactive Patient Education  2019 [employer].    Finding Treatment for [place] is a complex disease of the brain that causes an uncontrollable (compulsive) need for:  A substance. This includes alcohol, illegal drugs, or prescription medicines, such as painkillers.  An activity or behavior, such as gambling or shopping. Addiction changes the way your brain works. Because of this change:  The need for the medicine, drug, or activity  can become so strong that you think about it all the time.  Getting more and more of your addiction becomes the most important thing to you.  You may find yourself leaving other activities and relationships to pursue your addiction.  You can become physically dependent on a substance.  Your health, behavior, emotions, and relationships can change for the worse. How do I know if I need treatment for addiction? Addiction is a progressive disease. Without treatment, addiction can get worse. Living with addiction puts you at higher risk for injury, poor health, loss of employment, loss of money, and even death. You might need treatment for addiction if:  You have tried to stop or cut down, but you have not succeeded.  You find it annoying that your friends and family are concerned about your use or behavior.  You feel guilty about your use or behavior.  You need a particular substance or activity to start your day or to calm down.  You are running out of money because of your addiction.  You have done something illegal to support your addiction.  Your addiction has caused you: ? Health problems. ? Trouble in school, work, home, or with the police. ? To devote all your time to your addiction, and not to other responsibilities. ? To tell lies in order to hide your problem. What types of treatment are available? There may be options for treatment programs and plans based on your addiction, condition, needs, and preferences. No single treatment is right for everyone.  Treatment programs can be: ? Outpatient. You live at home and go to work or school, but you go to a clinic for treatment. ? Inpatient. You live and sleep at the program facility during treatment.  Programs may include: ? Medicine. You may need medicine to treat the addiction itself, or to treat anxiety or depression. ? Counseling and behavior therapy. This can help individuals and families behave in healthier ways and  relate more effectively. ? Support groups. Confidential group therapy, such as a 12-step program, can help individuals and families during treatment and recovery. ? A combination of education, counseling, and a 12-step, spirituality-based approach. What should I consider when selecting a treatment program? Think about your individual requirements when selecting a treatment program. Ask about:  The overall approach to treatment. ? Some programs are strictly 12-step programs. Some have a more flexible approach. ? Programs may differ in length of stay, setting, and size. ? Some programs include your family in your treatment plan. Support may be offered to them throughout the treatment process, as well as instructions for them when you are discharged. ? You may continue  to receive support after you have left the program.  The types of medical services that are offered. Find out if the program: ? Offers specific treatment for your particular addiction. ? Meets all of your needs, including physical and cultural needs. ? Includes any medicines you might need. ? Offers mental health counseling as part of your treatment. ? Offers the 12-step meetings at the center, or if transport is available for patients to attend meetings at other locations.  The cost and types of insurance that are accepted. ? Some programs are sponsored by the government. They support patients who do not have private insurance. ? If you do not have insurance, or if you choose to attend a program that does not accept your insurance, call the treatment center. Tell them your financial needs and whether a payment plan can be set up. ? There are also organizations that will help you find the resources for treatment. You can find them online by searching "treatment for addiction."  If the program is certified by the appropriate government agency. Where to find support  Your health care provider can help you to find the right  treatment. These discussions are confidential.  The [employer] on Alcoholism and Drug Dependence (NCADD). This group has information about treatment centers and programs for people who have an addiction and for family members. ? Call: 1-800-NCA-CALL ((564)597-9952). ? Visit the website: https://www.ncadd.org/  The Substance Abuse and Mental Health Services Administration [facility]). This organization will help you find publicly funded treatment centers, help hotlines, and counseling services near you. ? Call: 1-800-662-HELP 850-688-7426). ? Visit the website: www.findtreatment.SamedayNews.com.cy  The National Problem Gambling Helpline. This is a 24-hour confidential helpline for gambling addiction. ? Call: 8458005033 ? Visit the website: http://wiggins.com/ In countries outside of the [place] and [place], look in [employer] for contact information for services in your area. Follow these instructions at home:  Find supportive people who will help you stay away from your addiction and stay sober.  Do not use the substance or engage in the activity.  If you have been through treatment: ? Follow your plan. The plan is usually developed by you and your health care provider during treatment. ? Go to meetings with other people in recovery. ? Avoid people, situations, and things that lead you to do the things you are addicted to (triggers). Summary  Addiction changes the way your brain works. These changes cause a desire to repeat and increase the use of the a substance or behavior.  Addiction is a progressive disease. Without treatment, addiction can get worse. Living with addiction puts you at higher risk for injury, poor health, loss of employment, loss of money, and even death.  There may be options for treatment programs and plans based on your addiction, condition, needs, and preferences. No single treatment is right for everyone.  Your health care provider can help you  to find the right treatment. These discussions are confidential. This information is not intended to replace advice given to you by your health care provider. Make sure you discuss any questions you have with your health care provider. Document Released: 08/01/2005 Document Revised: 10/01/2017 Document Reviewed: 10/01/2017 Elsevier Interactive Patient Education  2019 [employer].  Alcohol Use Disorder Alcohol use disorder is when your drinking disrupts your daily life. When you have this condition, you drink too much alcohol and you cannot control your drinking. Alcohol use disorder can cause serious problems with your physical health. It can affect your brain, heart,  liver, pancreas, immune system, stomach, and intestines. Alcohol use disorder can increase your risk for certain cancers and cause problems with your mental health, such as depression, anxiety, psychosis, delirium, and dementia. People with this disorder risk hurting themselves and others. What are the causes? This condition is caused by drinking too much alcohol over time. It is not caused by drinking too much alcohol only one or two times. Some people with this condition drink alcohol to cope with or escape from negative life events. Others drink to relieve pain or symptoms of mental illness. What increases the risk? You are more likely to develop this condition if:  You have a family history of alcohol use disorder.  Your culture encourages drinking to the point of intoxication, or makes alcohol easy to get.  You had a mood or conduct disorder in childhood.  You have been a victim of abuse.  You are an adolescent and: ? You have poor grades or difficulties in school. ? Your caregivers do not talk to you about saying no to alcohol, or supervise your activities. ? You are impulsive or you have trouble with self-control. What are the signs or symptoms? Symptoms of this condition include:  Drinkingmore than you want  to.  Drinking for longer than you want to.  Trying several times to drink less or to control your drinking.  Spending a lot of time getting alcohol, drinking, or recovering from drinking.  Craving alcohol.  Having problems at work, at school, or at home due to drinking.  Having problems in relationships due to drinking.  Drinking when it is dangerous to drink, such as before driving a car.  Continuing to drink even though you know you might have a physical or mental problem related to drinking.  Needing more and more alcohol to get the same effect you want from the alcohol (building up tolerance).  Having symptoms of withdrawal when you stop drinking. Symptoms of withdrawal include: ? Fatigue. ? Nightmares. ? Trouble sleeping. ? Depression. ? Anxiety. ? Fever. ? Seizures. ? Severe confusion. ? Feeling or seeing things that are not there (hallucinations). ? Tremors. ? Rapid heart rate. ? Rapid breathing. ? High blood pressure.  Drinking to avoid symptoms of withdrawal. How is this diagnosed? This condition is diagnosed with an assessment. Your health care provider may start the assessment by asking three or four questions about your drinking. Your health care provider may perform a physical exam or do lab tests to see if you have physical problems resulting from alcohol use. She or he may refer you to a mental health professional for evaluation. How is this treated? Some people with alcohol use disorder are able to reduce their alcohol use to low-risk levels. Others need to completely quit drinking alcohol. When necessary, mental health professionals with specialized training in substance use treatment can help. Your health care provider can help you decide how severe your alcohol use disorder is and what type of treatment you need. The following forms of treatment are available:  Detoxification. Detoxification involves quitting drinking and using prescription medicines  within the first week to help lessen withdrawal symptoms. This treatment is important for people who have had withdrawal symptoms before and for heavy drinkers who are likely to have withdrawal symptoms. Alcohol withdrawal can be dangerous, and in severe cases, it can cause death. Detoxification may be provided in a home, community, or primary care setting, or in a hospital or substance use treatment facility.  Counseling. This treatment is  also called talk therapy. It is provided by substance use treatment counselors. A counselor can address the reasons you use alcohol and suggest ways to keep you from drinking again or to prevent problem drinking. The goals of talk therapy are to: ? Find healthy activities and ways for you to cope with stress. ? Identify and avoid the things that trigger your alcohol use. ? Help you learn how to handle cravings.  Medicines.Medicines can help treat alcohol use disorder by: ? Decreasing alcohol cravings. ? Decreasing the positive feeling you have when you drink alcohol. ? Causing an uncomfortable physical reaction when you drink alcohol (aversion therapy).  Support groups. Support groups are led by people who have quit drinking. They provide emotional support, advice, and guidance. These forms of treatment are often combined. Some people with this condition benefit from a combination of treatments provided by specialized substance use treatment centers. Follow these instructions at home:  Take over-the-counter and prescription medicines only as told by your health care provider.  Check with your health care provider before starting any new medicines.  Ask friends and family members not to offer you alcohol.  Avoid situations where alcohol is served, including gatherings where others are drinking alcohol.  Create a plan for what to do when you are tempted to use alcohol.  Find hobbies or activities that you enjoy that do not include alcohol.  Keep all  follow-up visits as told by your health care provider. This is important. How is this prevented?  If you drink, limit alcohol intake to no more than 1 drink a day for nonpregnant women and 2 drinks a day for men. One drink equals 12 oz of beer, 5 oz of wine, or 1 oz of hard liquor.  If you have a mental health condition, get treatment and support.  Do not give alcohol to adolescents.  If you are an adolescent: ? Do not drink alcohol. ? Do not be afraid to say no if someone offers you alcohol. Speak up about why you do not want to drink. You can be a positive role model for your friends and set a good example for those around you by not drinking alcohol. ? If your friends drink, spend time with others who do not drink alcohol. Make new friends who do not use alcohol. ? Find healthy ways to manage stress and emotions, such as meditation or deep breathing, exercise, spending time in nature, listening to music, or talking with a trusted friend or family member. Contact a health care provider if:  You are not able to take your medicines as told.  Your symptoms get worse.  You return to drinking alcohol (relapse) and your symptoms get worse. Get help right away if:  You have thoughts about hurting yourself or others. If you ever feel like you may hurt yourself or others, or have thoughts about taking your own life, get help right away. You can go to your nearest emergency department or call:  Your local emergency services (911 in the [place]  A suicide crisis helpline, such as the [place] at 313-608-2156. This is open 24 hours a day. Summary  Alcohol use disorder is when your drinking disrupts your daily life. When you have this condition, you drink too much alcohol and you cannot control your drinking.  Treatment may include detoxification, counseling, medicine, and support groups.  Ask friends and family members not to offer you alcohol. Avoid situations  where alcohol is served.  Get  help right away if you have thoughts about hurting yourself or others. This information is not intended to replace advice given to you by your health care provider. Make sure you discuss any questions you have with your health care provider. Document Released: 10/10/2004 Document Revised: 05/30/2016 Document Reviewed: 05/30/2016 Elsevier Interactive Patient Education  2019 [employer]  IMPORTANT INFORMATION: PAY CLOSE ATTENTION   PHYSICIAN DISCHARGE INSTRUCTIONS  Follow with Primary care provider  Lemmie Evens, MD  and other consultants as instructed your Hospitalist Physician  SEEK MEDICAL CARE OR RETURN TO EMERGENCY ROOM IF SYMPTOMS COME BACK, WORSEN OR NEW PROBLEM DEVELOPS.   Please note: You were cared for by a hospitalist during your hospital stay. Every effort will be made to forward records to your primary care provider.  You can request that your primary care provider send for your hospital records if they have not received them.  Once you are discharged, your primary care physician will handle any further medical issues. Please note that NO REFILLS for any discharge medications will be authorized once you are discharged, as it is imperative that you return to your primary care physician (or establish a relationship with a primary care physician if you do not have one) for your post hospital discharge needs so that they can reassess your need for medications and monitor your lab values.  Please get a complete blood count and chemistry panel checked by your Primary MD at your next visit, and again as instructed by your Primary MD.  Get Medicines reviewed and adjusted: Please take all your medications with you for your next visit with your Primary MD  Laboratory/radiological data: Please request your Primary MD to go over all hospital tests and procedure/radiological results at the follow up, please ask your primary care provider to get all Hospital  records sent to his/her office.  In some cases, they will be blood work, cultures and biopsy results pending at the time of your discharge. Please request that your primary care provider follow up on these results.  If you are diabetic, please bring your blood sugar readings with you to your follow up appointment with primary care.    Please call and make your follow up appointments as soon as possible.    Also Note the following: If you experience worsening of your admission symptoms, develop shortness of breath, life threatening emergency, suicidal or homicidal thoughts you must seek medical attention immediately by calling 911 or calling your MD immediately  if symptoms less severe.  You must read complete instructions/literature along with all the possible adverse reactions/side effects for all the Medicines you take and that have been prescribed to you. Take any new Medicines after you have completely understood and accpet all the possible adverse reactions/side effects.   Do not drive when taking Pain medications or sleeping medications (Benzodiazepines)  Do not take more than prescribed Pain, Sleep and Anxiety Medications. It is not advisable to combine anxiety,sleep and pain medications without talking with your primary care practitioner  Special Instructions: If you have smoked or chewed Tobacco  in the last 2 yrs please stop smoking, stop any regular Alcohol  and or any Recreational drug use.  Wear Seat belts while driving.

## 2019-01-18 NOTE — Discharge Summary (Signed)
Physician Discharge Summary  Sarah Ochoa GDJ:[id number] DOB: Nov 09, 1960 DOA: 01/17/2019  PCP: Lemmie Evens, MD  Admit date: 01/17/2019 Discharge date: 01/18/2019  Admitted From:  Home  Disposition: Home   Recommendations for Outpatient Follow-up:  1. Follow up with PCP in 1 weeks 2. Follow up with GI in 1 month  Discharge Condition: STABLE   CODE STATUS: FULL    Brief Hospitalization Summary: Please see all hospital notes, images, labs for full details of the hospitalization. HPI: Sarah Ochoa is a 58 y.o. female with history of chronic alcoholism and previous history of pancreatitis reports a week of symptoms of abdominal pain, abdominal swelling, epigastric abdominal pain that radiates into back associated with nausea and vomiting.  She has not been able to keep down liquids. She has been losing weight. She has worsening abdominal pain when eating. She has been drinking alcohol but not in the last several days.  She told the ED staff that her last drink was 2 weeks ago.  She has been vomiting clear to yellow emesis in the ED since arrival and unable to keep down fluids.  She says this feels similar to prior episodes of pancreatitis. She reports that she has had no symptoms of constipation or diarrhea. She denies fever and chills.  She denies headache.  She was seen in ED and noted to have an elevated lipase level of 118.  She was noted to be afebrile with BP 149/102.  She was given pain and nausea medication and admission was requested.    Hospital Course:  The patient was admitted with a presumed acute pancreatitis with abdominal pain symptoms.  She did have a mildly elevated lipase level on admission of 118.  She was admitted and started on supportive therapy with IV fluids IV pain and nausea medications and she was kept n.p.o.  I obtained a CT of the abdomen which came back with no acute findings.  Her repeat lipase level the next morning of 01/18/2019 was within normal limits.  Her CT scan  did not show any findings of pancreatitis.  Given these findings, I am advancing her diet and discharging home with outpatient follow-up.  She does not prior inpatient treatment at this time.  She has seen GI in the past and would like to have her follow-up with them.  Her main problem is chronic alcoholism and she has been counseled and given resources for treatment of chronic alcohol addiction.  She did not have any signs or symptoms of withdrawal from alcohol while in the hospital.  Follow-up with PCP strongly recommended as well.  Miralax recommended for chronic constipation.   Discharge Diagnoses:  Principal Problem:   Acute alcoholic pancreatitis Active Problems:   COPD (chronic obstructive pulmonary disease) (HCC)   Tobacco abuse   Alcohol abuse   Cocaine abuse (HCC)   Pancreatic duct dilated   Discharge Instructions: Discharge Instructions    Call MD for:  difficulty breathing, headache or visual disturbances   Complete by:  As directed    Call MD for:  extreme fatigue   Complete by:  As directed    Call MD for:  persistant dizziness or light-headedness   Complete by:  As directed    Call MD for:  persistant nausea and vomiting   Complete by:  As directed    Increase activity slowly   Complete by:  As directed      Allergies as of 01/18/2019      Reactions   Codeine  Hives   Sulfa Antibiotics Hives   Sulfonamide Derivatives Hives   Sulfamethoxazole Rash      Medication List    TAKE these medications   albuterol 108 (90 Base) MCG/ACT inhaler Commonly known as:  VENTOLIN HFA Inhale 1-2 puffs into the lungs every 6 (six) hours as needed for wheezing or shortness of breath.   albuterol (2.5 MG/3ML) 0.083% nebulizer solution Commonly known as:  PROVENTIL USE ONE AMPULE IN YOUR NEBULIZER UP TO 4 TIMES A DAY FOR BREATHING.   amLODipine 10 MG tablet Commonly known as:  NORVASC Take 1 tablet by mouth daily.   CVS Melatonin 5 MG Tabs Generic drug:  Melatonin TAKE ONE  TABLET BY MOUTH AT BEDTIME FOR SLEEP   famotidine 20 MG tablet Commonly known as:  PEPCID Take 1 tablet (20 mg total) by mouth every evening for 30 days.   fluticasone 50 MCG/ACT nasal spray Commonly known as:  FLONASE Place 2 sprays into the nose daily as needed for allergies.   hydrochlorothiazide 25 MG tablet Commonly known as:  HYDRODIURIL Take 25 mg by mouth every morning.   Klor-Con 10 10 MEQ tablet Generic drug:  potassium chloride Take 10 mEq by mouth daily.   LORazepam 1 MG tablet Commonly known as:  ATIVAN Take 1 tablet by mouth daily.   pantoprazole 40 MG tablet Commonly known as:  PROTONIX Take 1 tablet (40 mg total) by mouth 2 (two) times daily before a meal.   polyethylene glycol 17 g packet Commonly known as:  MIRALAX / GLYCOLAX Take 17 g by mouth daily for 30 days.   sertraline 100 MG tablet Commonly known as:  ZOLOFT Take 1 tablet by mouth daily.   Singulair 10 MG tablet Generic drug:  montelukast Take 10 mg by mouth at bedtime. What changed:  Another medication with the same name was removed. Continue taking this medication, and follow the directions you see here.   tiZANidine 4 MG tablet Commonly known as:  ZANAFLEX TAKE 1 TABLET (4 MG TOTAL) 2 (TWO) TIMES DAILY AS NEEDED BY MOUTH FOR MUSCLE SPASMS.   traMADol 50 MG tablet Commonly known as:  ULTRAM Take 1 tablet by mouth 2 (two) times daily as needed.   traZODone 50 MG tablet Commonly known as:  DESYREL Take 50 mg by mouth at bedtime.   Trelegy Ellipta 100-62.5-25 MCG/INH Aepb Generic drug:  Fluticasone-Umeclidin-Vilant INHALE 1 PUFF DAILY FOR BREATHING.      Follow-up Information    Lemmie Evens, MD. Schedule an appointment as soon as possible for a visit in 1 week.   Specialty:  Family Medicine Contact information: [place] [address] [phone]        Butch Penny, NP. Schedule an appointment as soon as possible for a visit on 01/25/2019.    Specialty:  Internal Medicine Why:  9:30 am Please wear a mask Contact information: [address] [place] [address] [phone]          Allergies  Allergen Reactions  . Codeine Hives  . Sulfa Antibiotics Hives  . Sulfonamide Derivatives Hives  . Sulfamethoxazole Rash   Allergies as of 01/18/2019      Reactions   Codeine Hives   Sulfa Antibiotics Hives   Sulfonamide Derivatives Hives   Sulfamethoxazole Rash      Medication List    TAKE these medications   albuterol 108 (90 Base) MCG/ACT inhaler Commonly known as:  VENTOLIN HFA Inhale 1-2 puffs into the  lungs every 6 (six) hours as needed for wheezing or shortness of breath.   albuterol (2.5 MG/3ML) 0.083% nebulizer solution Commonly known as:  PROVENTIL USE ONE AMPULE IN YOUR NEBULIZER UP TO 4 TIMES A DAY FOR BREATHING.   amLODipine 10 MG tablet Commonly known as:  NORVASC Take 1 tablet by mouth daily.   CVS Melatonin 5 MG Tabs Generic drug:  Melatonin TAKE ONE TABLET BY MOUTH AT BEDTIME FOR SLEEP   famotidine 20 MG tablet Commonly known as:  PEPCID Take 1 tablet (20 mg total) by mouth every evening for 30 days.   fluticasone 50 MCG/ACT nasal spray Commonly known as:  FLONASE Place 2 sprays into the nose daily as needed for allergies.   hydrochlorothiazide 25 MG tablet Commonly known as:  HYDRODIURIL Take 25 mg by mouth every morning.   Klor-Con 10 10 MEQ tablet Generic drug:  potassium chloride Take 10 mEq by mouth daily.   LORazepam 1 MG tablet Commonly known as:  ATIVAN Take 1 tablet by mouth daily.   pantoprazole 40 MG tablet Commonly known as:  PROTONIX Take 1 tablet (40 mg total) by mouth 2 (two) times daily before a meal.   polyethylene glycol 17 g packet Commonly known as:  MIRALAX / GLYCOLAX Take 17 g by mouth daily for 30 days.   sertraline 100 MG tablet Commonly known as:  ZOLOFT Take 1 tablet by mouth daily.   Singulair 10 MG tablet Generic drug:   montelukast Take 10 mg by mouth at bedtime. What changed:  Another medication with the same name was removed. Continue taking this medication, and follow the directions you see here.   tiZANidine 4 MG tablet Commonly known as:  ZANAFLEX TAKE 1 TABLET (4 MG TOTAL) 2 (TWO) TIMES DAILY AS NEEDED BY MOUTH FOR MUSCLE SPASMS.   traMADol 50 MG tablet Commonly known as:  ULTRAM Take 1 tablet by mouth 2 (two) times daily as needed.   traZODone 50 MG tablet Commonly known as:  DESYREL Take 50 mg by mouth at bedtime.   Trelegy Ellipta 100-62.5-25 MCG/INH Aepb Generic drug:  Fluticasone-Umeclidin-Vilant INHALE 1 PUFF DAILY FOR BREATHING.       Procedures/Studies: Ct Abdomen Pelvis W Contrast  Result Date: 01/17/2019 CLINICAL DATA:  Abdominal pain and distension, history COPD, pancreatitis, asthma, alcohol abuse, smoker EXAM: CT ABDOMEN AND PELVIS WITH CONTRAST TECHNIQUE: Multidetector CT imaging of the abdomen and pelvis was performed using the standard protocol following bolus administration of intravenous contrast. Sagittal and coronal MPR images reconstructed from axial data set. CONTRAST:  53mL OMNIPAQUE IOHEXOL 300 MG/ML SOLN PO, 11mL OMNIPAQUE IOHEXOL 300 MG/ML SOLN IV COMPARISON:  07/30/2018 FINDINGS: Lower chest: Dependent bibasilar atelectasis. Hepatobiliary: Gallbladder and liver normal appearance Pancreas: Normal appearance Spleen: Normal appearance Adrenals/Urinary Tract: Adrenal glands, kidneys, and proximal ureters normal appearance. Distal ureters and majority of bladder obscured by beam hardening artifacts in pelvis. Stomach/Bowel: Normal appendix. Stomach and visualized bowel loops normal appearance. Portions of large and small bowel in the pelvis are obscured by artifacts. Vascular/Lymphatic: Aorta normal caliber with scattered atherosclerotic calcifications. No adenopathy. Few scattered normal sized LEFT para-aortic nodes. Reproductive: Beam hardening artifacts in pelvis limit  assessment. By history post hysterectomy. Other: No free air or free fluid. Tiny umbilical hernia containing fat. No inflammatory process. Musculoskeletal: BILATERAL hip prostheses with beam hardening artifacts in pelvis. No acute osseous findings. IMPRESSION: No acute intra-abdominal or intrapelvic abnormalities as above. BILATERAL hip prostheses. Tiny umbilical hernia containing fat. Electronically Signed   By:  Lavonia Dana M.D.   On: 01/17/2019 19:18     Subjective: Pt wants to eat.  She says that her abdominal bloating is better.  She has no nausea or emesis.    Discharge Exam: Vitals:   01/18/19 0637 01/18/19 0722  BP: (!) 149/84   Pulse: (!) 56   Resp: 16   Temp: 98.2 F (36.8 C)   SpO2: 100% 100%   Vitals:   01/17/19 1939 01/17/19 2139 01/18/19 0637 01/18/19 0722  BP:  (!) 136/99 (!) 149/84   Pulse:  68 (!) 56   Resp:  17 16   Temp:  98 F (36.7 C) 98.2 F (36.8 C)   TempSrc:  Oral Oral   SpO2: 98% 100% 100% 100%  Weight:      Height:       General: Pt is alert, awake, not in acute distress Cardiovascular: RRR, S1/S2 +, no rubs, no gallops Respiratory: CTA bilaterally, no wheezing, no rhonchi Abdominal: Soft, NT, ND, bowel sounds + Extremities: no edema, no cyanosis   The results of significant diagnostics from this hospitalization (including imaging, microbiology, ancillary and laboratory) are listed below for reference.     Microbiology: Recent Results (from the past 240 hour(s))  SARS Coronavirus 2 (CEPHEID - Performed in [facility] hospital lab), Hosp Order     Status: None   Collection Time: 01/17/19 12:34 PM  Result Value Ref Range Status   SARS Coronavirus 2 NEGATIVE NEGATIVE Final    Comment: (NOTE) If result is NEGATIVE SARS-CoV-2 target nucleic acids are NOT DETECTED. The SARS-CoV-2 RNA is generally detectable in upper and lower  respiratory specimens during the acute phase of infection. The lowest  concentration of SARS-CoV-2 viral copies this assay  can detect is 250  copies / mL. A negative result does not preclude SARS-CoV-2 infection  and should not be used as the sole basis for treatment or other  patient management decisions.  A negative result may occur with  improper specimen collection / handling, submission of specimen other  than nasopharyngeal swab, presence of viral mutation(s) within the  areas targeted by this assay, and inadequate number of viral copies  (<250 copies / mL). A negative result must be combined with clinical  observations, patient history, and epidemiological information. If result is POSITIVE SARS-CoV-2 target nucleic acids are DETECTED. The SARS-CoV-2 RNA is generally detectable in upper and lower  respiratory specimens dur ing the acute phase of infection.  Positive  results are indicative of active infection with SARS-CoV-2.  Clinical  correlation with patient history and other diagnostic information is  necessary to determine patient infection status.  Positive results do  not rule out bacterial infection or co-infection with other viruses. If result is PRESUMPTIVE POSTIVE SARS-CoV-2 nucleic acids MAY BE PRESENT.   A presumptive positive result was obtained on the submitted specimen  and confirmed on repeat testing.  While 2019 novel coronavirus  (SARS-CoV-2) nucleic acids may be present in the submitted sample  additional confirmatory testing may be necessary for epidemiological  and / or clinical management purposes  to differentiate between  SARS-CoV-2 and other Sarbecovirus currently known to infect humans.  If clinically indicated additional testing with an alternate test  methodology [phone]) is advised. The SARS-CoV-2 RNA is generally  detectable in upper and lower respiratory sp ecimens during the acute  phase of infection. The expected result is Negative. Fact Sheet for Patients:  StrictlyIdeas.no Fact Sheet for Healthcare  Providers: BankingDealers.co.za This test is not  yet approved or cleared by the [place] and has been authorized for detection and/or diagnosis of SARS-CoV-2 by FDA under an Emergency Use Authorization (EUA).  This EUA will remain in effect (meaning this test can be used) for the duration of the COVID-19 declaration under Section 564(b)(1) of the Act, 21 U.S.C. section 360bbb-3(b)(1), unless the authorization is terminated or revoked sooner. Performed at [facility], [address], [place], [place] [address]      Labs: BNP (last 3 results) No results for input(s): BNP in the last 8760 hours. Basic Metabolic Panel: Recent Labs  Lab 01/17/19 1004 01/18/19 0539  NA 142 144  K 3.5 4.3  CL 112* 114*  CO2 21* 21*  GLUCOSE 94 83  BUN 23* 11  CREATININE 0.83 0.85  CALCIUM 9.4 9.2  MG  --  2.0   Liver Function Tests: Recent Labs  Lab 01/17/19 1004 01/18/19 0539  AST 24 25  ALT 17 14  ALKPHOS 68 68  BILITOT 0.3 0.7  PROT 7.5 7.0  ALBUMIN 4.0 3.6   Recent Labs  Lab 01/17/19 1004 01/18/19 0539  LIPASE 118* 39   No results for input(s): AMMONIA in the last 168 hours. CBC: Recent Labs  Lab 01/17/19 1004 01/18/19 0539  WBC 10.1 7.4  NEUTROABS 4.1 2.2  HGB 11.6* 12.8  HCT 34.9* 41.6  MCV 92.3 98.1  PLT 224 194   Cardiac Enzymes: No results for input(s): CKTOTAL, CKMB, CKMBINDEX, TROPONINI in the last 168 hours. BNP: Invalid input(s): POCBNP CBG: No results for input(s): GLUCAP in the last 168 hours. D-Dimer No results for input(s): DDIMER in the last 72 hours. Hgb A1c No results for input(s): HGBA1C in the last 72 hours. Lipid Profile No results for input(s): CHOL, HDL, LDLCALC, TRIG, CHOLHDL, LDLDIRECT in the last 72 hours. Thyroid function studies No results for input(s): TSH, T4TOTAL, T3FREE, THYROIDAB in the last 72 hours.  Invalid input(s): FREET3 Anemia work up No results for input(s): VITAMINB12, FOLATE, FERRITIN,  TIBC, IRON, RETICCTPCT in the last 72 hours. Urinalysis    Component Value Date/Time   COLORURINE YELLOW 01/17/2019 1004   APPEARANCEUR CLEAR 01/17/2019 1004   LABSPEC 1.013 01/17/2019 1004   PHURINE 5.0 01/17/2019 1004   GLUCOSEU NEGATIVE 01/17/2019 1004   HGBUR SMALL (A) 01/17/2019 1004   BILIRUBINUR NEGATIVE 01/17/2019 1004   KETONESUR NEGATIVE 01/17/2019 1004   PROTEINUR NEGATIVE 01/17/2019 1004   UROBILINOGEN 0.2 10/10/2014 1221   NITRITE NEGATIVE 01/17/2019 1004   LEUKOCYTESUR NEGATIVE 01/17/2019 1004   Sepsis Labs Invalid input(s): PROCALCITONIN,  WBC,  LACTICIDVEN Microbiology Recent Results (from the past 240 hour(s))  SARS Coronavirus 2 (CEPHEID - Performed in [facility] hospital lab), Hosp Order     Status: None   Collection Time: 01/17/19 12:34 PM  Result Value Ref Range Status   SARS Coronavirus 2 NEGATIVE NEGATIVE Final    Comment: (NOTE) If result is NEGATIVE SARS-CoV-2 target nucleic acids are NOT DETECTED. The SARS-CoV-2 RNA is generally detectable in upper and lower  respiratory specimens during the acute phase of infection. The lowest  concentration of SARS-CoV-2 viral copies this assay can detect is 250  copies / mL. A negative result does not preclude SARS-CoV-2 infection  and should not be used as the sole basis for treatment or other  patient management decisions.  A negative result may occur with  improper specimen collection / handling, submission of specimen other  than nasopharyngeal swab, presence of viral mutation(s) within the  areas  targeted by this assay, and inadequate number of viral copies  (<250 copies / mL). A negative result must be combined with clinical  observations, patient history, and epidemiological information. If result is POSITIVE SARS-CoV-2 target nucleic acids are DETECTED. The SARS-CoV-2 RNA is generally detectable in upper and lower  respiratory specimens dur ing the acute phase of infection.  Positive  results are  indicative of active infection with SARS-CoV-2.  Clinical  correlation with patient history and other diagnostic information is  necessary to determine patient infection status.  Positive results do  not rule out bacterial infection or co-infection with other viruses. If result is PRESUMPTIVE POSTIVE SARS-CoV-2 nucleic acids MAY BE PRESENT.   A presumptive positive result was obtained on the submitted specimen  and confirmed on repeat testing.  While 2019 novel coronavirus  (SARS-CoV-2) nucleic acids may be present in the submitted sample  additional confirmatory testing may be necessary for epidemiological  and / or clinical management purposes  to differentiate between  SARS-CoV-2 and other Sarbecovirus currently known to infect humans.  If clinically indicated additional testing with an alternate test  methodology [phone]) is advised. The SARS-CoV-2 RNA is generally  detectable in upper and lower respiratory sp ecimens during the acute  phase of infection. The expected result is Negative. Fact Sheet for Patients:  StrictlyIdeas.no Fact Sheet for Healthcare Providers: BankingDealers.co.za This test is not yet approved or cleared by the [place] FDA and has been authorized for detection and/or diagnosis of SARS-CoV-2 by FDA under an Emergency Use Authorization (EUA).  This EUA will remain in effect (meaning this test can be used) for the duration of the COVID-19 declaration under Section 564(b)(1) of the Act, 21 U.S.C. section 360bbb-3(b)(1), unless the authorization is terminated or revoked sooner. Performed at [facility], [address] [place], [place] [address]    Time coordinating discharge:   SIGNED:  Irwin Brakeman, MD  [facility] 01/18/2019, 8:47 AM How to contact the [facility] Attending or Consulting provider [place] or covering provider during after hours [place], for this patient?  1. Check the care team in [facility]  and look for a) attending/consulting [facility] provider listed and b) the [facility] team listed 2. Log into [email or web address] and use [place]'s universal password to access. If you do not have the password, please contact the hospital operator. 3. Locate the [facility] provider you are looking for under [facility] and page to a number that you can be directly reached. 4. If you still have difficulty reaching the provider, please page the [facility] (Director on Call) for the Hospitalists listed on amion for assistance.

## 2019-01-18 NOTE — Progress Notes (Signed)
Nsg Discharge Note  Admit Date:  01/17/2019 Discharge date: 01/18/2019   Clayborne Dana to be D/C'd Home per MD order.  AVS completed.  Copy for chart, and copy for patient signed, and dated. Patient/caregiver able to verbalize understanding.  Discharge Medication: Allergies as of 01/18/2019      Reactions   Codeine Hives   Sulfa Antibiotics Hives   Sulfonamide Derivatives Hives   Sulfamethoxazole Rash      Medication List    TAKE these medications   albuterol 108 (90 Base) MCG/ACT inhaler Commonly known as:  VENTOLIN HFA Inhale 1-2 puffs into the lungs every 6 (six) hours as needed for wheezing or shortness of breath.   albuterol (2.5 MG/3ML) 0.083% nebulizer solution Commonly known as:  PROVENTIL USE ONE AMPULE IN YOUR NEBULIZER UP TO 4 TIMES A DAY FOR BREATHING.   amLODipine 10 MG tablet Commonly known as:  NORVASC Take 1 tablet by mouth daily.   CVS Melatonin 5 MG Tabs Generic drug:  Melatonin TAKE ONE TABLET BY MOUTH AT BEDTIME FOR SLEEP   famotidine 20 MG tablet Commonly known as:  PEPCID Take 1 tablet (20 mg total) by mouth every evening for 30 days.   fluticasone 50 MCG/ACT nasal spray Commonly known as:  FLONASE Place 2 sprays into the nose daily as needed for allergies.   hydrochlorothiazide 25 MG tablet Commonly known as:  HYDRODIURIL Take 25 mg by mouth every morning.   Klor-Con 10 10 MEQ tablet Generic drug:  potassium chloride Take 10 mEq by mouth daily.   LORazepam 1 MG tablet Commonly known as:  ATIVAN Take 1 tablet by mouth daily.   pantoprazole 40 MG tablet Commonly known as:  PROTONIX Take 1 tablet (40 mg total) by mouth 2 (two) times daily before a meal.   polyethylene glycol 17 g packet Commonly known as:  MIRALAX / GLYCOLAX Take 17 g by mouth daily for 30 days.   sertraline 100 MG tablet Commonly known as:  ZOLOFT Take 1 tablet by mouth daily.   Singulair 10 MG tablet Generic drug:  montelukast Take 10 mg by mouth at bedtime. What  changed:  Another medication with the same name was removed. Continue taking this medication, and follow the directions you see here.   tiZANidine 4 MG tablet Commonly known as:  ZANAFLEX TAKE 1 TABLET (4 MG TOTAL) 2 (TWO) TIMES DAILY AS NEEDED BY MOUTH FOR MUSCLE SPASMS.   traMADol 50 MG tablet Commonly known as:  ULTRAM Take 1 tablet by mouth 2 (two) times daily as needed.   traZODone 50 MG tablet Commonly known as:  DESYREL Take 50 mg by mouth at bedtime.   Trelegy Ellipta 100-62.5-25 MCG/INH Aepb Generic drug:  Fluticasone-Umeclidin-Vilant INHALE 1 PUFF DAILY FOR BREATHING.       Discharge Assessment: Vitals:   01/18/19 0637 01/18/19 0722  BP: (!) 149/84   Pulse: (!) 56   Resp: 16   Temp: 98.2 F (36.8 C)   SpO2: 100% 100%   Skin clean, dry and intact without evidence of skin break down, no evidence of skin tears noted. IV catheter discontinued intact. Site without signs and symptoms of complications - no redness or edema noted at insertion site, patient denies c/o pain - only slight tenderness at site.  Dressing with slight pressure applied.  D/c Instructions-Education: Discharge instructions given to patient/family with verbalized understanding. D/c education completed with patient/family including follow up instructions, medication list, d/c activities limitations if indicated, with other d/c instructions as  indicated by MD - patient able to verbalize understanding, all questions fully answered. Patient instructed to return to ED, call 911, or call MD for any changes in condition.  Patient escorted via [place], and D/C home via private auto.  Loa Socks, RN 01/18/2019 10:58 AM

## 2019-01-18 NOTE — Care Management CC44 (Signed)
Condition Code 44 Documentation Completed  Patient Details  Name: Sarah Ochoa MRN: [id number] Date of Birth: [birth year]   Condition Code 44 given:  Yes Patient signature on Condition Code 44 notice:  Yes Documentation of 2 MD's agreement:  Yes Code 44 added to claim:  Yes    Boneta Lucks, RN 01/18/2019, 9:07 AM

## 2019-01-18 NOTE — Progress Notes (Signed)
Brief initial visit offering emotional and spiritual support.  

## 2019-01-18 NOTE — Care Management Obs Status (Signed)
[facility] NOTIFICATION   Patient Details  Name: Sarah Ochoa MRN: [id number] Date of Birth: May 19, 1961   Medicare Observation Status Notification Given:  Yes    Boneta Lucks, RN 01/18/2019, 9:07 AM

## 2019-01-25 ENCOUNTER — Encounter (INDEPENDENT_AMBULATORY_CARE_PROVIDER_SITE_OTHER): Payer: Self-pay | Admitting: *Deleted

## 2019-01-25 ENCOUNTER — Telehealth (INDEPENDENT_AMBULATORY_CARE_PROVIDER_SITE_OTHER): Payer: Self-pay | Admitting: Internal Medicine

## 2019-01-25 ENCOUNTER — Other Ambulatory Visit: Payer: Self-pay

## 2019-01-25 ENCOUNTER — Ambulatory Visit (INDEPENDENT_AMBULATORY_CARE_PROVIDER_SITE_OTHER): Payer: Medicare Other | Admitting: Internal Medicine

## 2019-01-25 ENCOUNTER — Emergency Department (HOSPITAL_COMMUNITY)
Admission: EM | Admit: 2019-01-25 | Discharge: 2019-01-25 | Disposition: A | Discharge status: Home / Self Care | Payer: Medicare Other

## 2019-01-25 ENCOUNTER — Encounter (INDEPENDENT_AMBULATORY_CARE_PROVIDER_SITE_OTHER): Payer: Self-pay | Admitting: Internal Medicine

## 2019-01-25 VITALS — BP 144/90 | HR 70 | Temp 97.4°F | Ht 65.0 in | Wt 127.6 lb

## 2019-01-25 DIAGNOSIS — K852 Alcohol induced acute pancreatitis without necrosis or infection: Secondary | ICD-10-CM

## 2019-01-25 LAB — HEPATIC FUNCTION PANEL
AG Ratio: 1.6 (calc) (ref 1.0–2.5)
ALT: 11 U/L (ref 6–29)
AST: 15 U/L (ref 10–35)
Albumin: 4.3 g/dL (ref 3.6–5.1)
Alkaline phosphatase (APISO): 78 U/L (ref 37–153)
Bilirubin, Direct: 0 mg/dL (ref 0.0–0.2)
Globulin: 2.7 g/dL (calc) (ref 1.9–3.7)
Indirect Bilirubin: 0.2 mg/dL (calc) (ref 0.2–1.2)
Total Bilirubin: 0.2 mg/dL (ref 0.2–1.2)
Total Protein: 7 g/dL (ref 6.1–8.1)

## 2019-01-25 LAB — AMYLASE: Amylase: 108 U/L — ABNORMAL HIGH (ref 21–101)

## 2019-01-25 LAB — LIPASE: Lipase: 325 U/L — ABNORMAL HIGH (ref 7–60)

## 2019-01-25 MED ORDER — TRAMADOL HCL 50 MG PO TABS: 50.0000 mg | ORAL_TABLET | Freq: Two times a day (BID) | ORAL | 0 refills | Status: DC | PRN

## 2019-01-25 NOTE — Telephone Encounter (Signed)
Rx for Tramadol sent to her pharmacy.  I discussed with Dr. Laural Golden. She does not need to go to the ED. Will set her up for an EUS with Dr. Ardis Hughs.

## 2019-01-25 NOTE — Telephone Encounter (Signed)
Sarah Ochoa, Patient needs an EUS with Dr. Ardis Hughs.   Diagnosis: pancreatitis possibly alcohol related. Persistent pain.  Elevated Lipase and amylase.d Dilated pancreatic duct
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
## 2019-01-25 NOTE — Patient Instructions (Addendum)
Labs today OV in 6 months. Nees to stop drinking.  US abdomen

## 2019-01-26 NOTE — Telephone Encounter (Signed)
Request sent to [facility] for EUS, she will contact patient [place] has been canceled

## 2019-02-09 ENCOUNTER — Other Ambulatory Visit: Payer: Self-pay | Admitting: Family Medicine

## 2019-02-09 ENCOUNTER — Ambulatory Visit (HOSPITAL_COMMUNITY): Payer: Medicare Other

## 2019-02-09 ENCOUNTER — Other Ambulatory Visit (HOSPITAL_COMMUNITY): Payer: Self-pay | Admitting: Family Medicine

## 2019-02-09 DIAGNOSIS — R109 Unspecified abdominal pain: Secondary | ICD-10-CM

## 2019-02-09 DIAGNOSIS — R222 Localized swelling, mass and lump, trunk: Secondary | ICD-10-CM

## 2019-02-12 ENCOUNTER — Ambulatory Visit (HOSPITAL_COMMUNITY)
Admission: RE | Admit: 2019-02-12 | Discharge: 2019-02-12 | Disposition: A | Discharge status: Home / Self Care | Payer: Medicare Other | Source: Ambulatory Visit | Attending: Family Medicine | Admitting: Family Medicine

## 2019-02-12 ENCOUNTER — Other Ambulatory Visit: Payer: Self-pay

## 2019-02-12 DIAGNOSIS — R109 Unspecified abdominal pain: Secondary | ICD-10-CM | POA: Diagnosis not present

## 2019-02-12 DIAGNOSIS — R222 Localized swelling, mass and lump, trunk: Secondary | ICD-10-CM | POA: Insufficient documentation

## 2019-02-12 MED ORDER — IOHEXOL 300 MG/ML  SOLN
100.0000 mL | Freq: Once | INTRAMUSCULAR | Status: AC | PRN
  Administered 2019-02-12: 100 mL via INTRAVENOUS

## 2019-03-02 ENCOUNTER — Other Ambulatory Visit: Payer: Self-pay | Admitting: Nurse Practitioner

## 2019-03-02 DIAGNOSIS — M542 Cervicalgia: Secondary | ICD-10-CM

## 2019-03-18 ENCOUNTER — Encounter (INDEPENDENT_AMBULATORY_CARE_PROVIDER_SITE_OTHER): Payer: Self-pay | Admitting: *Deleted

## 2019-03-18 ENCOUNTER — Encounter (INDEPENDENT_AMBULATORY_CARE_PROVIDER_SITE_OTHER): Payer: Self-pay | Admitting: Internal Medicine

## 2019-03-18 ENCOUNTER — Other Ambulatory Visit: Payer: Self-pay

## 2019-03-18 ENCOUNTER — Ambulatory Visit (INDEPENDENT_AMBULATORY_CARE_PROVIDER_SITE_OTHER): Payer: Medicare Other | Admitting: Internal Medicine

## 2019-03-18 VITALS — BP 139/92 | HR 76 | Temp 98.2°F | Ht 65.0 in | Wt 123.7 lb

## 2019-03-18 DIAGNOSIS — K852 Alcohol induced acute pancreatitis without necrosis or infection: Secondary | ICD-10-CM | POA: Diagnosis not present
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
## 2019-03-19 LAB — LIPASE: Lipase: 65 U/L — ABNORMAL HIGH (ref 7–60)

## 2019-03-19 LAB — AMYLASE: Amylase: 42 U/L (ref 21–101)

## 2019-03-22 ENCOUNTER — Other Ambulatory Visit (INDEPENDENT_AMBULATORY_CARE_PROVIDER_SITE_OTHER): Payer: Self-pay | Admitting: *Deleted

## 2019-03-22 DIAGNOSIS — K852 Alcohol induced acute pancreatitis without necrosis or infection: Secondary | ICD-10-CM

## 2019-03-22 NOTE — Progress Notes (Signed)
lipase

## 2019-03-23 ENCOUNTER — Ambulatory Visit (HOSPITAL_COMMUNITY): Payer: Medicare Other

## 2019-03-26 ENCOUNTER — Other Ambulatory Visit: Payer: Self-pay

## 2019-03-26 ENCOUNTER — Ambulatory Visit
Admission: RE | Admit: 2019-03-26 | Discharge: 2019-03-26 | Disposition: A | Discharge status: Home / Self Care | Payer: Medicare Other | Source: Ambulatory Visit | Attending: Nurse Practitioner | Admitting: Nurse Practitioner

## 2019-03-26 DIAGNOSIS — M542 Cervicalgia: Secondary | ICD-10-CM

## 2019-03-26 MED ORDER — TRIAMCINOLONE ACETONIDE 40 MG/ML IJ SUSP (RADIOLOGY)
60.0000 mg | Freq: Once | INTRAMUSCULAR | Status: AC
  Administered 2019-03-26: 10:00:00 60 mg via EPIDURAL

## 2019-03-26 MED ORDER — IOPAMIDOL (ISOVUE-M 300) INJECTION 61%
1.0000 mL | Freq: Once | INTRAMUSCULAR | Status: AC | PRN
  Administered 2019-03-26: 10:00:00 1 mL via EPIDURAL

## 2019-03-26 NOTE — Discharge Instructions (Signed)

## 2019-03-29 ENCOUNTER — Ambulatory Visit (HOSPITAL_COMMUNITY)
Admission: RE | Admit: 2019-03-29 | Discharge: 2019-03-29 | Disposition: A | Discharge status: Home / Self Care | Payer: Medicare Other | Source: Ambulatory Visit | Attending: Internal Medicine | Admitting: Internal Medicine

## 2019-03-29 ENCOUNTER — Other Ambulatory Visit: Payer: Self-pay

## 2019-03-29 DIAGNOSIS — K852 Alcohol induced acute pancreatitis without necrosis or infection: Secondary | ICD-10-CM | POA: Diagnosis not present

## 2019-04-12 ENCOUNTER — Other Ambulatory Visit (INDEPENDENT_AMBULATORY_CARE_PROVIDER_SITE_OTHER): Payer: Self-pay | Admitting: *Deleted

## 2019-04-12 DIAGNOSIS — K852 Alcohol induced acute pancreatitis without necrosis or infection: Secondary | ICD-10-CM

## 2019-06-22 ENCOUNTER — Encounter (HOSPITAL_COMMUNITY): Payer: Self-pay | Admitting: Emergency Medicine

## 2019-06-22 ENCOUNTER — Other Ambulatory Visit: Payer: Self-pay

## 2019-06-22 ENCOUNTER — Emergency Department (HOSPITAL_COMMUNITY)
Admission: EM | Admit: 2019-06-22 | Discharge: 2019-06-22 | Disposition: A | Discharge status: Home / Self Care | Payer: Medicare Other | Attending: Emergency Medicine | Admitting: Emergency Medicine

## 2019-06-22 DIAGNOSIS — M542 Cervicalgia: Secondary | ICD-10-CM | POA: Diagnosis not present

## 2019-06-22 DIAGNOSIS — I10 Essential (primary) hypertension: Secondary | ICD-10-CM | POA: Diagnosis not present

## 2019-06-22 DIAGNOSIS — M79601 Pain in right arm: Secondary | ICD-10-CM | POA: Diagnosis present

## 2019-06-22 DIAGNOSIS — J449 Chronic obstructive pulmonary disease, unspecified: Secondary | ICD-10-CM | POA: Insufficient documentation

## 2019-06-22 DIAGNOSIS — F17210 Nicotine dependence, cigarettes, uncomplicated: Secondary | ICD-10-CM | POA: Diagnosis not present

## 2019-06-22 DIAGNOSIS — J45909 Unspecified asthma, uncomplicated: Secondary | ICD-10-CM | POA: Diagnosis not present

## 2019-06-22 DIAGNOSIS — Z79899 Other long term (current) drug therapy: Secondary | ICD-10-CM | POA: Insufficient documentation

## 2019-06-22 MED ORDER — PREDNISONE 50 MG PO TABS: 50.0000 mg | ORAL_TABLET | Freq: Every day | ORAL | 0 refills | Status: DC

## 2019-06-22 MED ORDER — HYDROCODONE-ACETAMINOPHEN 5-325 MG PO TABS
1.0000 | ORAL_TABLET | Freq: Once | ORAL | Status: AC
  Administered 2019-06-22: 1 via ORAL
  Filled 2019-06-22: qty 1

## 2019-06-22 MED ORDER — TRAMADOL HCL 50 MG PO TABS: 50.0000 mg | ORAL_TABLET | Freq: Four times a day (QID) | ORAL | 0 refills | Status: DC | PRN

## 2019-06-22 MED ORDER — KETOROLAC TROMETHAMINE 60 MG/2ML IM SOLN
60.0000 mg | Freq: Once | INTRAMUSCULAR | Status: AC
  Administered 2019-06-22: 09:00:00 60 mg via INTRAMUSCULAR
  Filled 2019-06-22: qty 2

## 2019-06-22 NOTE — ED Triage Notes (Signed)
Pt c/o of right arm and left wrist pain x 1 month. Denies any injuries.  Rash noted to arm and wrist as well.
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
## 2019-06-22 NOTE — Discharge Instructions (Addendum)
Follow-up with your orthopedist by calling and setting up an appointment as soon as possible.  Return here as needed.  Use ice and heat on your neck and shoulder.

## 2019-06-25 ENCOUNTER — Other Ambulatory Visit: Payer: Self-pay | Admitting: Nurse Practitioner

## 2019-07-07 IMAGING — CT CT ABD-PELV W/ CM
2 of 5 series · 15 of 46 positions shown, 17 images · IV contrast (Isovue)
Comparison: CT [facility] 05/05/2017 and 04/28/2013

CLINICAL DATA: Abdominal pain.

EXAM:
CT ABDOMEN AND PELVIS WITH CONTRAST
TECHNIQUE: Multidetector CT imaging of the abdomen and pelvis was performed
using the standard protocol following bolus administration of
intravenous contrast.
CONTRAST:  100mL OOU5AQ-7FF IOPAMIDOL (OOU5AQ-7FF) INJECTION 61%

[Series 2: axial st · axial · 0.72mm/px · z∈[+854,+1264]mm · 12 of 94 slices shown, 14 images]
[im 6/94  soft-tissue]
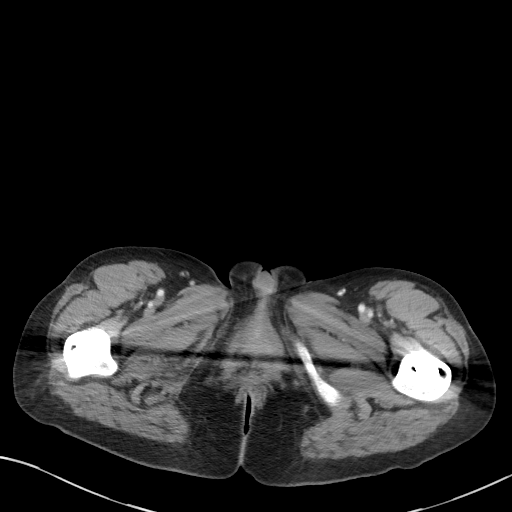
[im 6/94  bone]
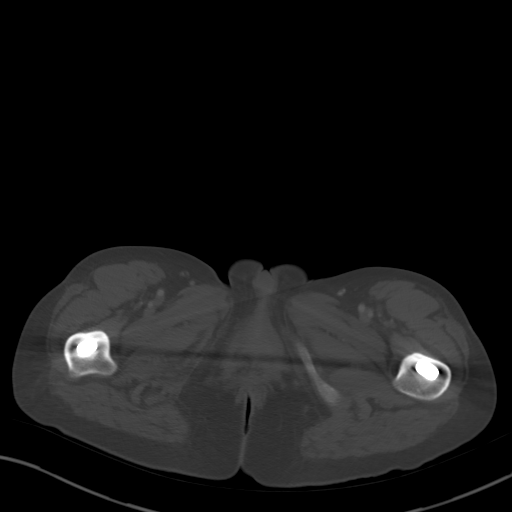
[im 21/94  soft-tissue]
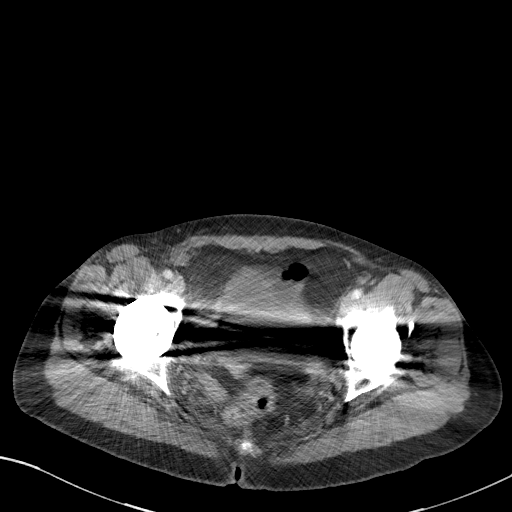
[im 26/94  soft-tissue]
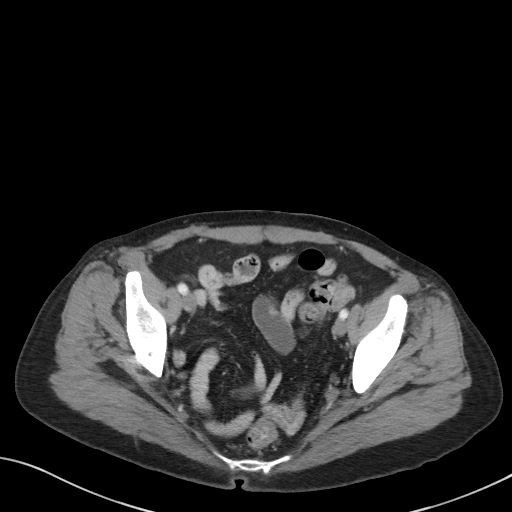
[im 32/94  soft-tissue]
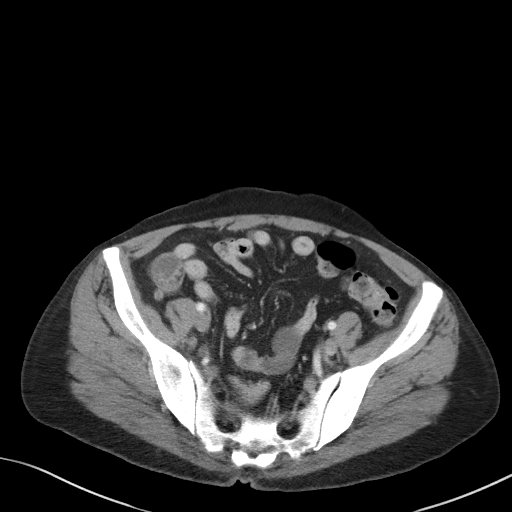
[im 42/94  soft-tissue]
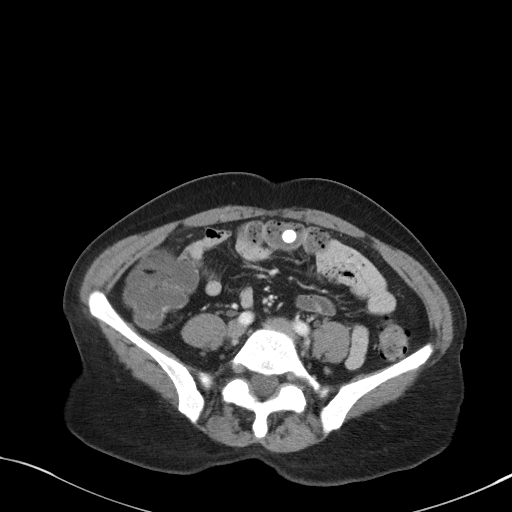
[im 47/94  soft-tissue]
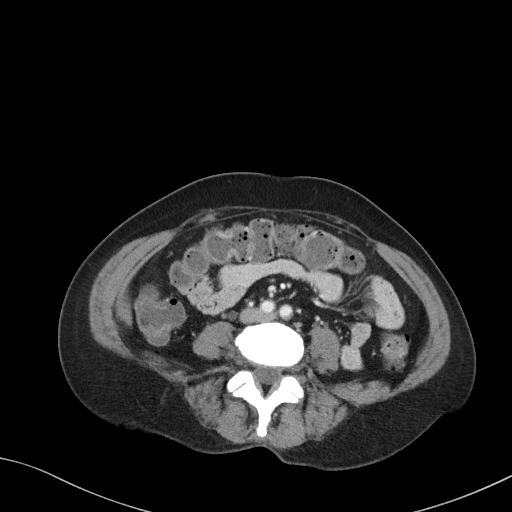
[im 52/94  soft-tissue]
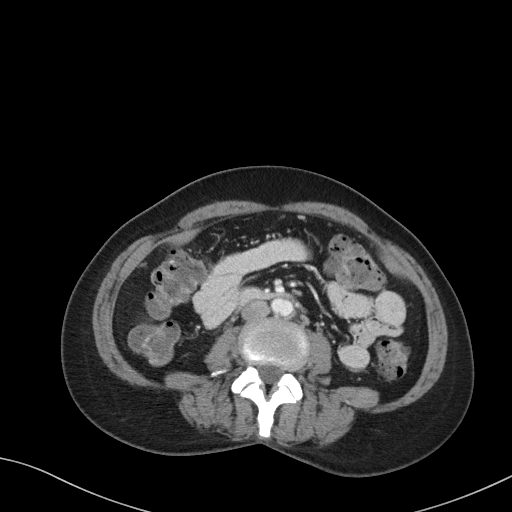
[im 63/94  soft-tissue]
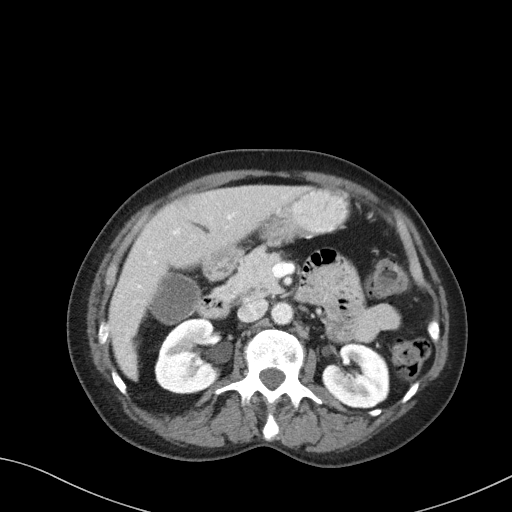
[im 68/94  soft-tissue]
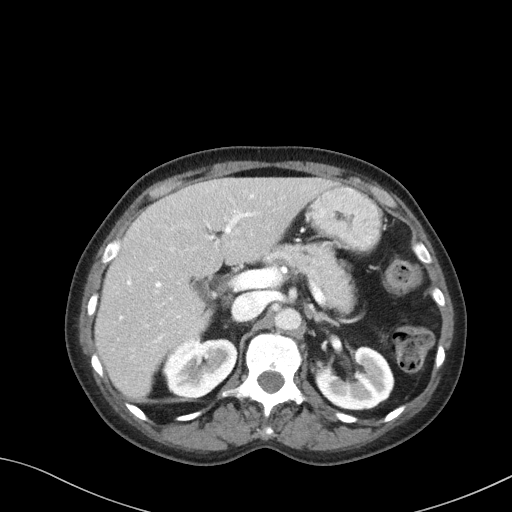
[im 68/94  bone]
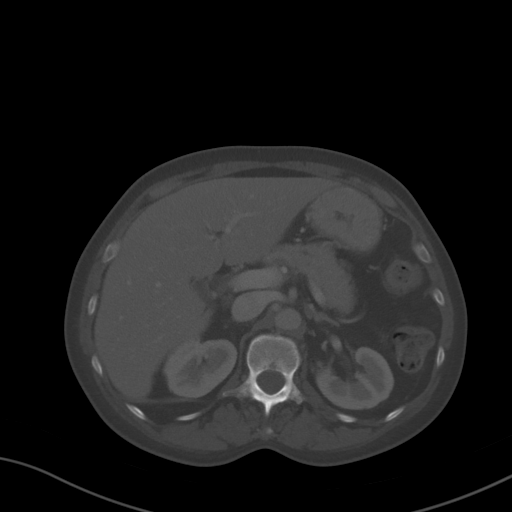
[im 73/94  soft-tissue]
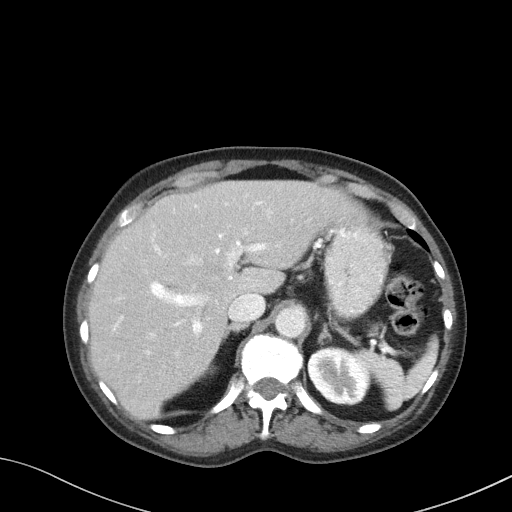
[im 83/94  soft-tissue]
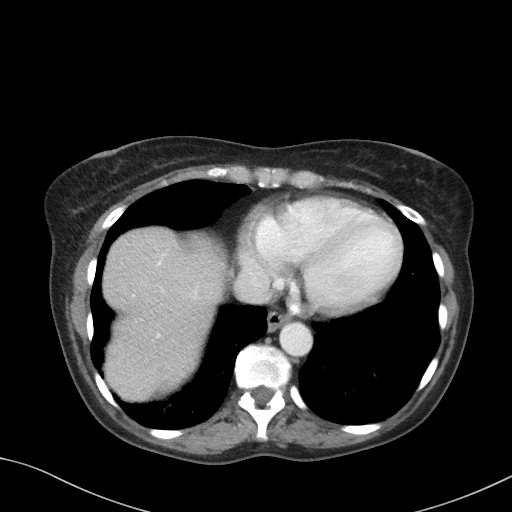
[im 88/94  soft-tissue]
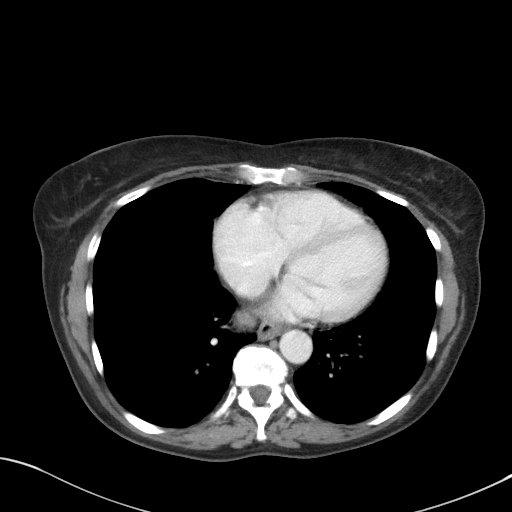

[Series 5: coronal st · coronal · 0.62mm/px · 3 of 82 slices shown]
[im 28/82  soft-tissue]
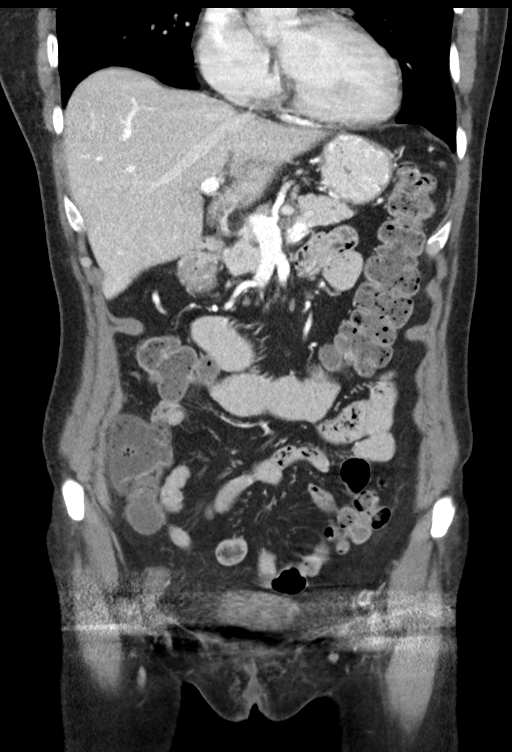
[im 37/82  soft-tissue]
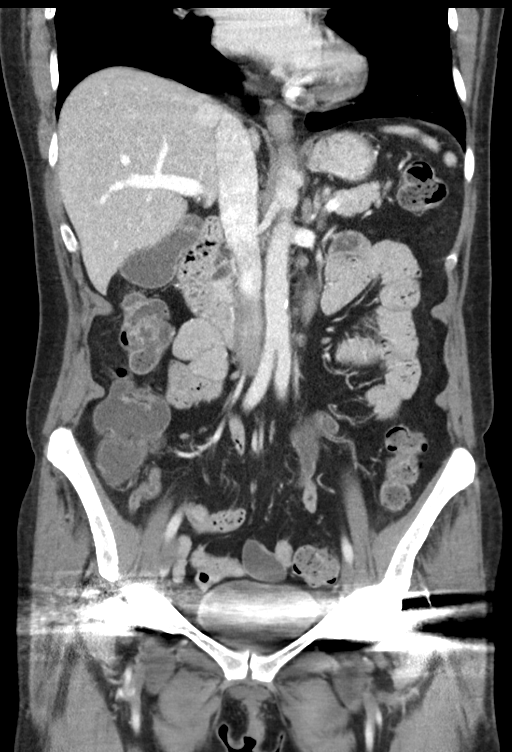
[im 46/82  soft-tissue]
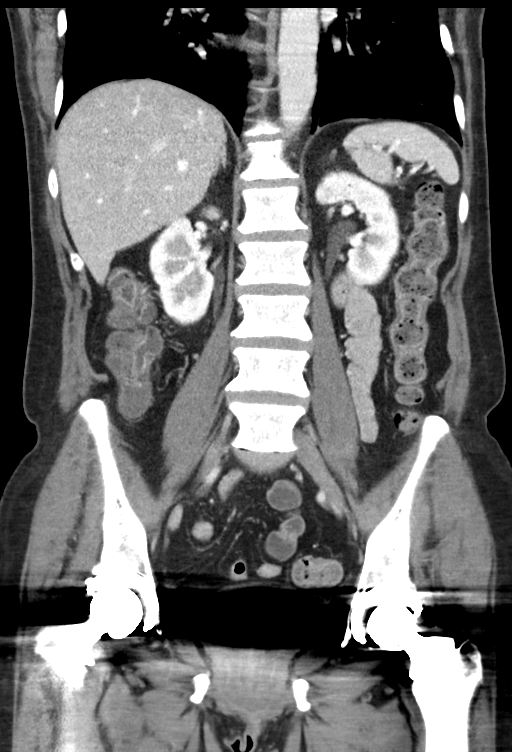

[15 of 46 positions shown; findings below may reference images not displayed]

FINDINGS: Lower chest: No acute abnormality.

Hepatobiliary: No focal liver abnormality is seen. No gallstones,
gallbladder wall thickening, or biliary dilatation.

Pancreas: Unremarkable. There is new increase caliber of the main
duct. At the level of the head the duct measures up to 6 mm, image
34/2. No mass or inflammation identified..

Spleen: Normal in size without focal abnormality.

Adrenals/Urinary Tract: Normal appearance of the adrenal glands.
Unremarkable appearance of both kidneys. The urinary bladder appears
normal.

Stomach/Bowel: The stomach is normal. The small bowel loops have a
normal course and caliber. The appendix is visualized and appears
normal. Unremarkable appearance of the colon.

Vascular/Lymphatic: Aortic atherosclerosis without aneurysm. The
portal vein and hepatic veins are patent. No abdominal or pelvic
adenopathy identified.

Reproductive: Status post hysterectomy. No adnexal masses.

Other: No abdominal wall hernia or abnormality. No abdominopelvic
ascites.

Musculoskeletal: Degenerative disc disease identified. No aggressive
lytic or sclerotic bone lesions.
IMPRESSION: 1. No acute findings within the abdomen or pelvis.
2. Increase caliber of the main duct which now measures 6 mm. The
etiology is indeterminate. Further investigation could be obtained
with contrast enhanced MRI/MRCP.
3.  Aortic Atherosclerosis ([id number]).

## 2019-07-07 IMAGING — MR MR 3D RECON AT SCANNER
20 series · 20 of 20 positions shown · IV contrast (multihance)
Comparison: 02/06/2018

CLINICAL DATA: Atypical presentation of suspected pancreatitis.
Dilated main pancreatic duct.

EXAM:
MRI ABDOMEN WITHOUT AND WITH CONTRAST (INCLUDING MRCP)
TECHNIQUE: Multiplanar multisequence MR imaging of the abdomen was performed
both before and after the administration of intravenous contrast.
Heavily T2-weighted images of the biliary and pancreatic ducts were
obtained, and three-dimensional MRCP images were rendered by post
processing.
CONTRAST:  10mL MULTIHANCE GADOBENATE DIMEGLUMINE 529 MG/ML IV SOLN

[Series 3: T2 · coronal · 5.0mm · 1.04mm/px · 1 of 36 slices shown (1 of 3)]
[im 1/36]
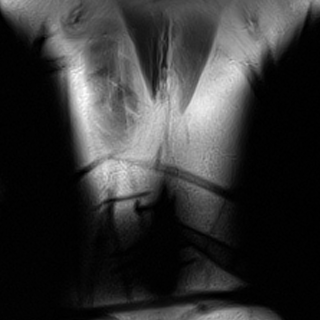

[Series 4: t2fs axial blade · axial · 4.0mm · 1.00mm/px · 1 of 48 slices shown]
[im 1/48]
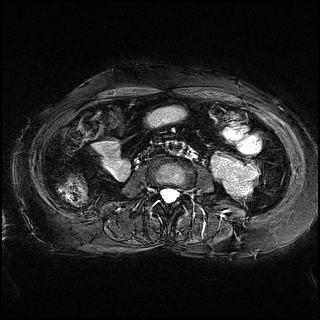

[Series 5: MRCP · coronal · 4.0mm · 0.78mm/px · 1 of 15 slices shown]
[im 1/15]
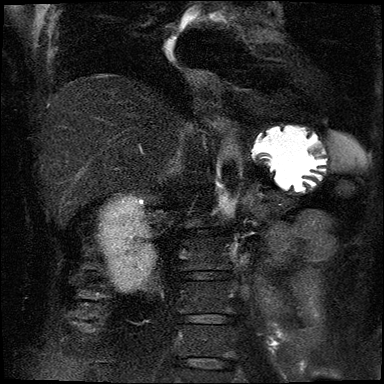

[Series 7: T2 · coronal · 1.0mm · 0.28mm/px · 1 of 104 slices shown (2 of 3)]
[im 1/104]
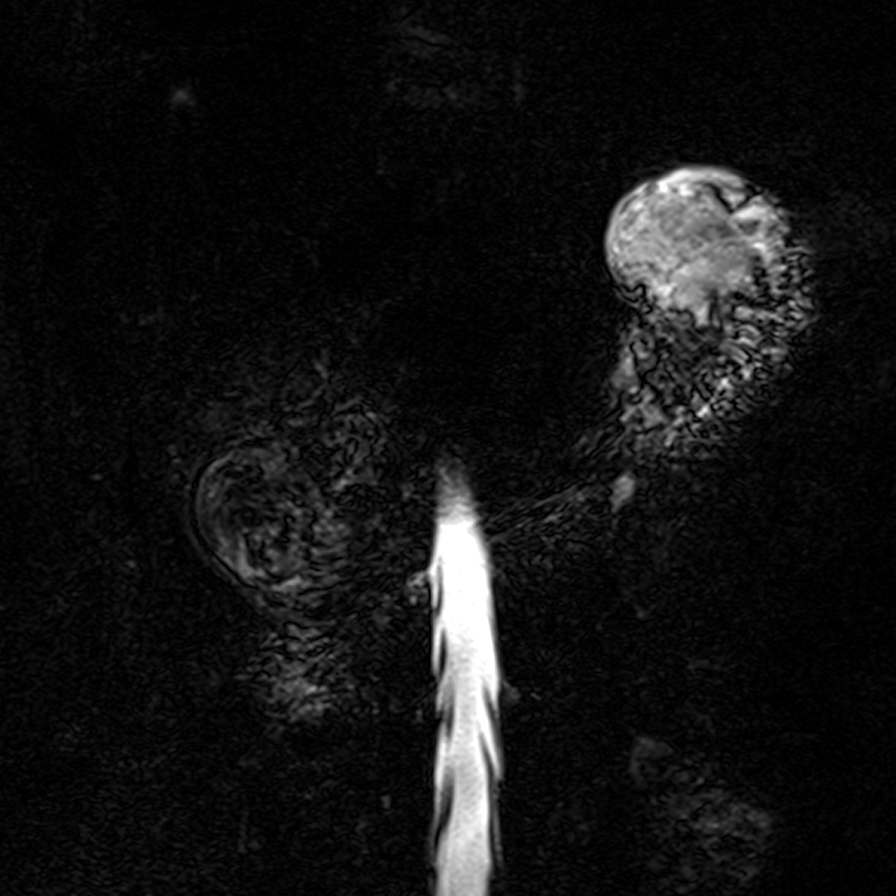

[Series 9: DWI · axial · 5.0mm · 0.81mm/px · 1 of 80 slices shown (1 of 2)]
[im 1/80]
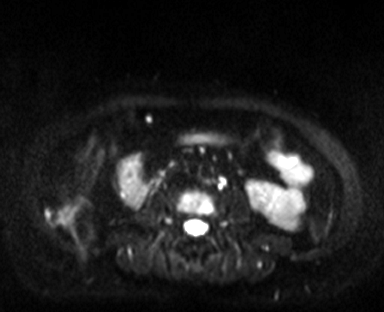

[Series 10: DWI · axial · 5.0mm · 0.81mm/px · 1 of 40 slices shown (2 of 2)]
[im 1/40]
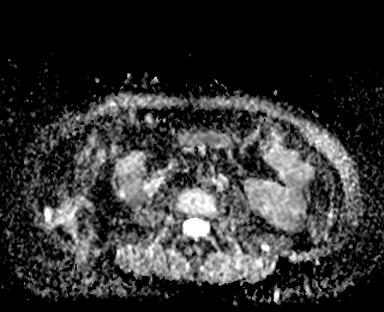

[Series 12: bSSFP · axial · 4.0mm · 1.25mm/px · 1 of 54 slices shown]
[im 1/54]
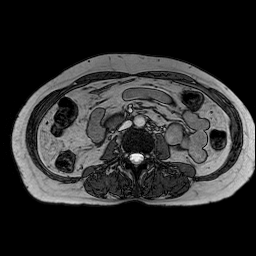

[Series 13: T1 · axial · 4.0mm · 0.55mm/px · 1 of 120 slices shown]
[im 1/120]
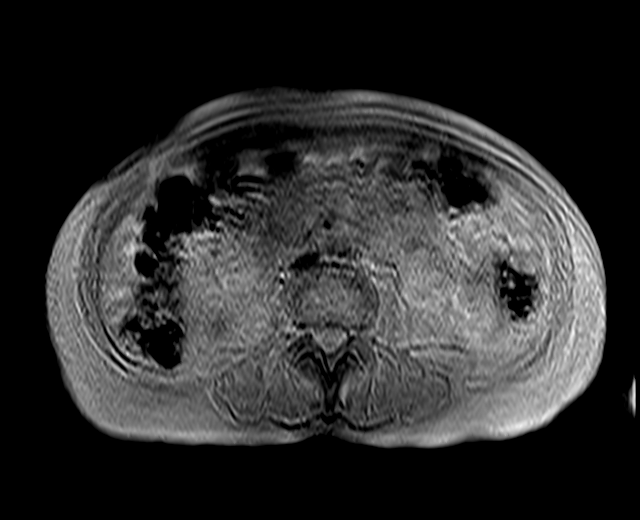

[Series 22: t1fs coronal post · coronal · 2.5mm · 1.15mm/px · 1 of 88 slices shown]
[im 1/88]
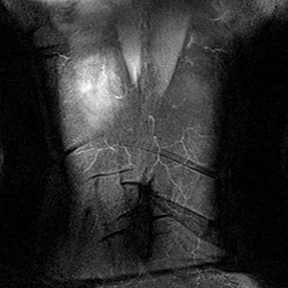

[Series 23: T2 · axial · 5.0mm · 1.32mm/px · 1 of 36 slices shown (3 of 3)]
[im 1/36]
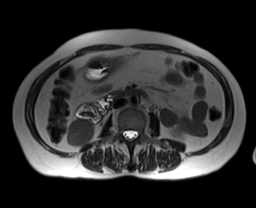

[Series 24: 5 min delay · axial · delayed · 3.5mm · 1.04mm/px · 1 of 72 slices shown]
[im 1/72]
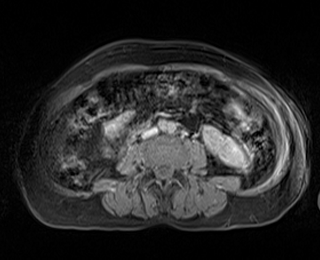

[Series 25: 5 min delay_sub · axial · 3.5mm · 1.04mm/px · 1 of 72 slices shown]
[im 1/72]
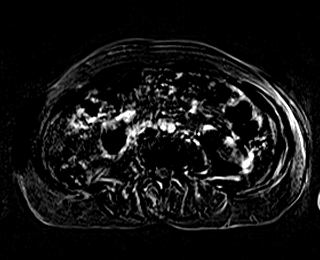

[Series 101: <mip range>_fil_1 · sagittal · 0.25mm/px · 1 of 27 slices shown]
[im 1/27]
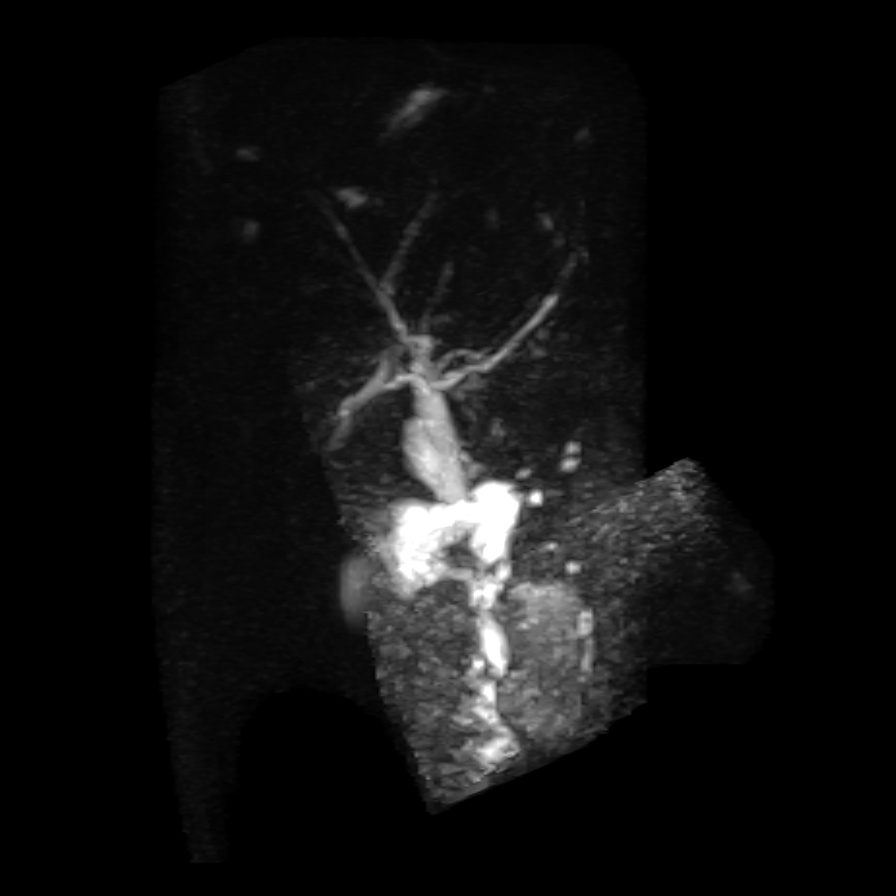

[Series 5004: pre · axial · non-contrast · 3.5mm · 1.04mm/px · 1 of 72 slices shown]
[im 1/72]
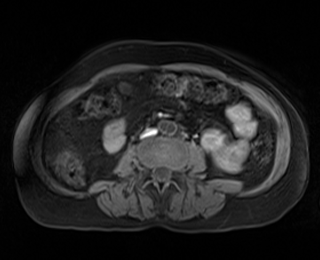

[Series 5005: (id) · axial · 3.5mm · 1.04mm/px · 1 of 72 slices shown (1 of 6)]
[im 1/72]
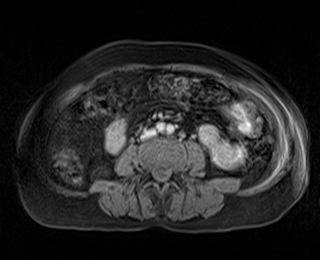

[Series 5006: (id) · axial · 3.5mm · 1.04mm/px · 1 of 72 slices shown (2 of 6)]
[im 1/72]
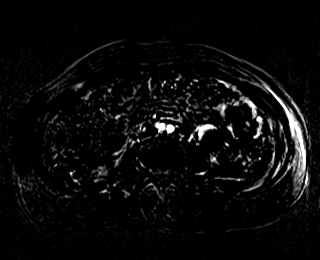

[Series 5007: (id) · axial · 3.5mm · 1.04mm/px · 1 of 72 slices shown (3 of 6)]
[im 1/72]
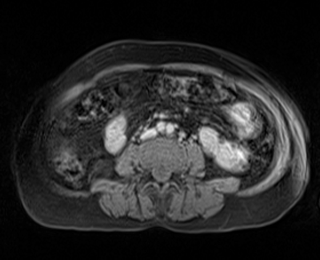

[Series 5008: (id) · axial · 3.5mm · 1.04mm/px · 1 of 72 slices shown (4 of 6)]
[im 1/72]
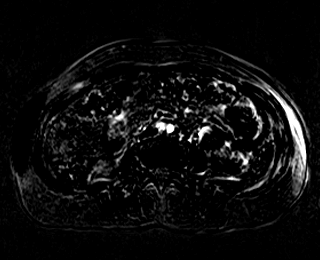

[Series 5009: (id) · axial · 3.5mm · 1.04mm/px · 1 of 72 slices shown (5 of 6)]
[im 1/72]
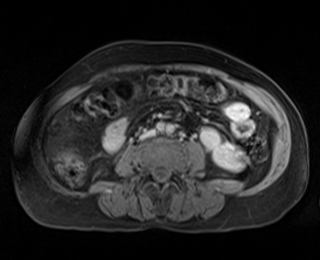

[Series 5010: (id) · axial · 3.5mm · 1.04mm/px · 1 of 72 slices shown (6 of 6)]
[im 1/72]
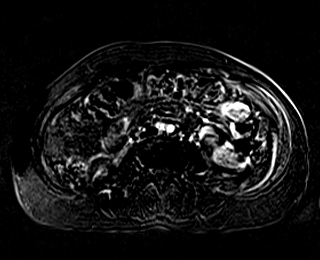

[20 of 20 positions shown; findings below may reference images not displayed]

FINDINGS: Despite efforts by the technologist and patient, motion artifact is
present on today's exam and could not be eliminated. This is the
usual result when MRCP is attempted in the inpatient setting where
patients are less likely to be able to breath hold and cooperate in
controlling motion. This reduces exam sensitivity and specificity.

Lower chest: Unremarkable

Hepatobiliary: No appreciable focal lesion. No significant biliary
dilatation. Gallbladder unremarkable. No obvious filling defect in
the biliary tree.

Pancreas: Most of the dorsal pancreatic duct is about 2-3 mm in
diameter. In the pancreatic head, the duct is slightly more
prominent, as shown on recent CT, measuring up to 6 mm. I do not see
a mass in the pancreatic head or ampulla to account for this
appearance. No pancreas divisum is identified.

Spleen:  Unremarkable

Adrenals/Urinary Tract:  Unremarkable

Stomach/Bowel: Unremarkable

Vascular/Lymphatic:  Aortoiliac atherosclerotic vascular disease.

Other:  No supplemental non-categorized findings.

Musculoskeletal: Unremarkable
IMPRESSION: 1. Mildly dilated short segment of the dorsal pancreatic duct in the
pancreatic head period cause is uncertain. I do not see a mass,
subject to the limitations of motion artifact that typically occur
on inpatient MRCP imaging. I speculate that the dilatation may be
postinflammatory.
2.  Aortic Atherosclerosis ([id number]-DK0.0).

## 2019-07-13 ENCOUNTER — Emergency Department (HOSPITAL_COMMUNITY): Payer: Medicare Other

## 2019-07-13 ENCOUNTER — Encounter (HOSPITAL_COMMUNITY): Payer: Self-pay | Admitting: Emergency Medicine

## 2019-07-13 ENCOUNTER — Emergency Department (HOSPITAL_COMMUNITY)
Admission: EM | Admit: 2019-07-13 | Discharge: 2019-07-13 | Disposition: A | Discharge status: Home / Self Care | Payer: Medicare Other | Attending: Emergency Medicine | Admitting: Emergency Medicine

## 2019-07-13 ENCOUNTER — Other Ambulatory Visit: Payer: Self-pay

## 2019-07-13 DIAGNOSIS — J449 Chronic obstructive pulmonary disease, unspecified: Secondary | ICD-10-CM | POA: Diagnosis not present

## 2019-07-13 DIAGNOSIS — K5900 Constipation, unspecified: Secondary | ICD-10-CM | POA: Insufficient documentation

## 2019-07-13 DIAGNOSIS — I10 Essential (primary) hypertension: Secondary | ICD-10-CM | POA: Diagnosis not present

## 2019-07-13 DIAGNOSIS — F17210 Nicotine dependence, cigarettes, uncomplicated: Secondary | ICD-10-CM | POA: Insufficient documentation

## 2019-07-13 DIAGNOSIS — J45909 Unspecified asthma, uncomplicated: Secondary | ICD-10-CM | POA: Insufficient documentation

## 2019-07-13 DIAGNOSIS — Z79899 Other long term (current) drug therapy: Secondary | ICD-10-CM | POA: Diagnosis not present

## 2019-07-13 DIAGNOSIS — R1084 Generalized abdominal pain: Secondary | ICD-10-CM | POA: Diagnosis present

## 2019-07-13 DIAGNOSIS — R109 Unspecified abdominal pain: Secondary | ICD-10-CM

## 2019-07-13 LAB — COMPREHENSIVE METABOLIC PANEL
ALT: 18 U/L (ref 0–44)
AST: 27 U/L (ref 15–41)
Albumin: 4.4 g/dL (ref 3.5–5.0)
Alkaline Phosphatase: 105 U/L (ref 38–126)
Anion gap: 10 (ref 5–15)
BUN: 22 mg/dL — ABNORMAL HIGH (ref 6–20)
CO2: 24 mmol/L (ref 22–32)
Calcium: 9.8 mg/dL (ref 8.9–10.3)
Chloride: 106 mmol/L (ref 98–111)
Creatinine, Ser: 0.94 mg/dL (ref 0.44–1.00)
GFR calc Af Amer: >60 mL/min (ref >60)
GFR calc non Af Amer: >60 mL/min (ref >60)
Glucose, Bld: 84 mg/dL (ref 70–99)
Potassium: 4 mmol/L (ref 3.5–5.1)
Sodium: 140 mmol/L (ref 135–145)
Total Bilirubin: 0.4 mg/dL (ref 0.3–1.2)
Total Protein: 8.6 g/dL — ABNORMAL HIGH (ref 6.5–8.1)

## 2019-07-13 LAB — CBC WITH DIFFERENTIAL/PLATELET
Abs Immature Granulocytes: 0.02 10*3/uL (ref 0.00–0.07)
Basophils Absolute: 0.1 10*3/uL (ref 0.0–0.1)
Basophils Relative: 1 %
Eosinophils Absolute: 0.4 10*3/uL (ref 0.0–0.5)
Eosinophils Relative: 5 %
HCT: 42.3 % (ref 36.0–46.0)
Hemoglobin: 13.5 g/dL (ref 12.0–15.0)
Immature Granulocytes: 0 %
Lymphocytes Relative: 41 %
Lymphs Abs: 3.5 10*3/uL (ref 0.7–4.0)
MCH: 30.1 pg (ref 26.0–34.0)
MCHC: 31.9 g/dL (ref 30.0–36.0)
MCV: 94.4 fL (ref 80.0–100.0)
Monocytes Absolute: 0.7 10*3/uL (ref 0.1–1.0)
Monocytes Relative: 9 %
Neutro Abs: 3.8 10*3/uL (ref 1.7–7.7)
Neutrophils Relative %: 44 %
Platelets: 231 10*3/uL (ref 150–400)
RBC: 4.48 MIL/uL (ref 3.87–5.11)
RDW: 15.4 % (ref 11.5–15.5)
WBC: 8.6 10*3/uL (ref 4.0–10.5)
nRBC: 0 % (ref 0.0–0.2)

## 2019-07-13 LAB — LIPASE, BLOOD: Lipase: 51 U/L (ref 11–51)

## 2019-07-13 MED ORDER — IOHEXOL 300 MG/ML  SOLN
100.0000 mL | Freq: Once | INTRAMUSCULAR | Status: AC | PRN
  Administered 2019-07-13: 14:00:00 100 mL via INTRAVENOUS

## 2019-07-13 MED ORDER — ONDANSETRON HCL 4 MG/2ML IJ SOLN
4.0000 mg | Freq: Once | INTRAMUSCULAR | Status: AC
  Administered 2019-07-13: 15:00:00 4 mg via INTRAVENOUS
  Filled 2019-07-13: qty 2

## 2019-07-13 MED ORDER — MAGNESIUM HYDROXIDE 400 MG/5ML PO SUSP
30.0000 mL | Freq: Once | ORAL | Status: AC
  Administered 2019-07-13: 15:00:00 30 mL via ORAL
  Filled 2019-07-13: qty 30

## 2019-07-13 MED ORDER — FENTANYL CITRATE (PF) 100 MCG/2ML IJ SOLN
50.0000 ug | Freq: Once | INTRAMUSCULAR | Status: AC
  Administered 2019-07-13: 50 ug via INTRAVENOUS
  Filled 2019-07-13: qty 2

## 2019-07-13 MED ORDER — SODIUM CHLORIDE 0.9 % IV BOLUS
500.0000 mL | Freq: Once | INTRAVENOUS | Status: AC
  Administered 2019-07-13: 500 mL via INTRAVENOUS

## 2019-07-13 MED ORDER — ONDANSETRON HCL 4 MG/2ML IJ SOLN
4.0000 mg | Freq: Once | INTRAMUSCULAR | Status: AC
  Administered 2019-07-13: 13:00:00 4 mg via INTRAVENOUS
  Filled 2019-07-13: qty 2

## 2019-07-13 NOTE — Discharge Instructions (Addendum)
Increase fluids, fruit, fiber.  Recommend the following products over-the-counter: MiraLAX, milk of magnesia, Dulcolax, Senokot.

## 2019-07-13 NOTE — ED Triage Notes (Signed)
Patient states she has not had a bowel movement for a week, feels like she needs to go, but cant

## 2019-07-13 NOTE — ED Notes (Signed)
Transported to CT 
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
## 2019-07-20 ENCOUNTER — Ambulatory Visit (HOSPITAL_COMMUNITY)
Admission: RE | Admit: 2019-07-20 | Discharge: 2019-07-20 | Disposition: A | Discharge status: Home / Self Care | Payer: Medicare Other | Source: Ambulatory Visit | Attending: Internal Medicine | Admitting: Internal Medicine

## 2019-07-20 ENCOUNTER — Other Ambulatory Visit (HOSPITAL_COMMUNITY): Payer: Self-pay | Admitting: Internal Medicine

## 2019-07-20 ENCOUNTER — Other Ambulatory Visit: Payer: Self-pay

## 2019-07-20 DIAGNOSIS — M25511 Pain in right shoulder: Secondary | ICD-10-CM

## 2019-07-28 ENCOUNTER — Encounter (INDEPENDENT_AMBULATORY_CARE_PROVIDER_SITE_OTHER): Payer: Self-pay | Admitting: Nurse Practitioner

## 2019-07-28 ENCOUNTER — Ambulatory Visit (INDEPENDENT_AMBULATORY_CARE_PROVIDER_SITE_OTHER): Payer: Medicare Other | Admitting: Nurse Practitioner

## 2019-07-28 ENCOUNTER — Other Ambulatory Visit (HOSPITAL_COMMUNITY): Payer: Medicare Other

## 2019-07-28 ENCOUNTER — Ambulatory Visit (HOSPITAL_COMMUNITY)
Admission: RE | Admit: 2019-07-28 | Discharge: 2019-07-28 | Disposition: A | Discharge status: Home / Self Care | Payer: Medicare Other | Source: Ambulatory Visit | Attending: Nurse Practitioner | Admitting: Nurse Practitioner

## 2019-07-28 ENCOUNTER — Other Ambulatory Visit: Payer: Self-pay

## 2019-07-28 DIAGNOSIS — R1032 Left lower quadrant pain: Secondary | ICD-10-CM | POA: Insufficient documentation

## 2019-07-28 DIAGNOSIS — R1013 Epigastric pain: Secondary | ICD-10-CM

## 2019-07-28 MED ORDER — FAMOTIDINE 20 MG PO TABS: 20.0000 mg | ORAL_TABLET | Freq: Every day | ORAL | 1 refills | Status: DC

## 2019-07-28 MED ORDER — IOHEXOL 9 MG/ML PO SOLN
ORAL | Status: AC
  Filled 2019-07-28: qty 1000

## 2019-07-28 NOTE — Patient Instructions (Signed)
  1. Complete the provided lab order now  2. Stat abdominal/pelvic CT scan  3. Go to [facility] ER if your abdominal pain worsens   4. Tylenol 500mg  2 tabs twice daily as needed for pain  5. Famotidine 20 mg one tab at bed time. Continue Pantoprazole 40mg  twice daily  6. Follow up in office in 2 weeks, will need to schedule EGD and colonoscopy at that time

## 2019-07-28 NOTE — Progress Notes (Signed)
Subjective:    Patient ID: Sarah Ochoa, female    DOB: 12-Apr-1961, 58 y.o.   MRN: [id number]  HPI Sarah Ochoa is a 58 year old female with history of anxiety, depression, alcohol abuse, pancreatitis, COPD and avascular necrosis to both hips.  She presents today with complaints of epigastric pain for the past 2 months.  She has frequent nausea which is worse in the morning.  No vomiting.  She describes feeling as if food sits to the lower office where it joins the stomach.  She gets full easily.  She has also developed left lower quadrant abdominal pain within the past week.  She stated this pain is severe.  No fever, sweats or chills.  She has constipation for which she recently started lactulose.  She is now passing 1 or 2 softer stools daily.  No rectal bleeding or melena.  She presented to [facility] emergency room 07/13/2019 with generalized abdominal pain and bloating.  An abdominal/pelvic CT with contrast did not show any evidence of bowel obstruction or diverticulitis.  She stated her current left lower quadrant abdominal pain was not present when she presented to the ED, this is a new pain.  No prior history of diverticulitis.  The left lower quadrant pain decreases slightly after she urinates or passes a bowel movement.  She denies having any alcohol for the past 5 to 6 months.  She reported having a colonoscopy 15 or 20 years ago which was done after she ate a large quantity of cherries and she thought she was passing bloody stool.  The colonoscopy was normal and her red stool was attributed to eating a large amount of cherries.  No family history of colorectal cancer.  Other and sister died from breast cancer.  1 sister is living with breast cancer.  2 brothers died from lung cancer.  She stated having genetic testing at [facility] about 10 years ago, she was not considered a high risk for cancer.   Abdominal/pelvic CT with contrast 07/13/2019: No acute abnormality identified. No  evidence of bowel obstruction or diverticulitis. Hepatobiliary: No focal liver abnormality is seen. No gallstones, gallbladder wall thickening, or biliary dilatation. Pancreas: Unremarkable. No pancreatic ductal dilatation or surrounding inflammatory changes   CBC Latest Ref Rng & Units 07/13/2019 01/18/2019 01/17/2019  WBC 4.0 - 10.5 K/uL 8.6 7.4 10.1  Hemoglobin 12.0 - 15.0 g/dL 13.5 12.8 11.6(L)  Hematocrit 36.0 - 46.0 % 42.3 41.6 34.9(L)  Platelets 150 - 400 K/uL 231 194 224   CMP Latest Ref Rng & Units 07/13/2019 01/25/2019 01/18/2019  Glucose 70 - 99 mg/dL 84 - 83  BUN 6 - 20 mg/dL 22(H) - 11  Creatinine 0.44 - 1.00 mg/dL 0.94 - 0.85  Sodium 135 - 145 mmol/L 140 - 144  Potassium 3.5 - 5.1 mmol/L 4.0 - 4.3  Chloride 98 - 111 mmol/L 106 - 114(H)  CO2 22 - 32 mmol/L 24 - 21(L)  Calcium 8.9 - 10.3 mg/dL 9.8 - 9.2  Total Protein 6.5 - 8.1 g/dL 8.6(H) 7.0 7.0  Total Bilirubin 0.3 - 1.2 mg/dL 0.4 0.2 0.7  Alkaline Phos 38 - 126 U/L 105 - 68  AST 15 - 41 U/L 27 15 25   ALT 0 - 44 U/L 18 11 14    Current Outpatient Medications on File Prior to Visit  Medication Sig Dispense Refill  . albuterol (PROVENTIL HFA;VENTOLIN HFA) 108 (90 Base) MCG/ACT inhaler Inhale 1-2 puffs into the lungs every 6 (six) hours as needed  for wheezing or shortness of breath. 1 Inhaler 0  . albuterol (PROVENTIL) (2.5 MG/3ML) 0.083% nebulizer solution USE ONE AMPULE IN YOUR NEBULIZER UP TO 4 TIMES A DAY FOR BREATHING.  11  . amLODipine (NORVASC) 10 MG tablet Take 1 tablet by mouth daily.  11  . [facility] MELATONIN 5 MG TABS TAKE ONE TABLET BY MOUTH AT BEDTIME FOR SLEEP  3  . fluticasone (FLONASE) 50 MCG/ACT nasal spray Place 2 sprays into the nose daily as needed for allergies.     . hydrochlorothiazide (HYDRODIURIL) 25 MG tablet Take 25 mg by mouth every morning.     Marland Kitchen KLOR-CON 10 10 MEQ tablet Take 10 mEq by mouth daily.  11  . lactulose (CHRONULAC) 10 GM/15ML solution Take 10 g by mouth daily.     Marland Kitchen LORazepam (ATIVAN) 1  MG tablet Take 1 tablet by mouth daily.   1  . montelukast (SINGULAIR) 10 MG tablet Take 10 mg by mouth at bedtime.    . pantoprazole (PROTONIX) 40 MG tablet Take 1 tablet (40 mg total) by mouth 2 (two) times daily before a meal. 60 tablet 0  . sertraline (ZOLOFT) 100 MG tablet Take 1 tablet by mouth daily.    Marland Kitchen tiZANidine (ZANAFLEX) 4 MG tablet TAKE 1 TABLET (4 MG TOTAL) 2 (TWO) TIMES DAILY AS NEEDED BY MOUTH FOR MUSCLE SPASMS. 60 tablet 0  . traZODone (DESYREL) 50 MG tablet Take 50 mg by mouth at bedtime.    . TRELEGY ELLIPTA 100-62.5-25 MCG/INH AEPB INHALE 1 PUFF DAILY FOR BREATHING.    . triamcinolone cream (KENALOG) 0.1 % Apply 1 application topically 2 (two) times daily.     . predniSONE (DELTASONE) 50 MG tablet Take 1 tablet (50 mg total) by mouth daily with breakfast. (Patient not taking: Reported on 07/28/2019) 5 tablet 0   No current facility-administered medications on file prior to visit.    Allergies  Allergen Reactions  . Codeine Hives  . Sulfa Antibiotics Hives  . Sulfonamide Derivatives Hives  . Sulfamethoxazole Rash    Review of Systems the HPI, all other systems reviewed and are negative     Objective:   Physical Exam  BP 114/75   Pulse 88   Temp 98 F (36.7 C) (Oral)   Wt 130 lb 12.8 oz (59.3 kg)   BMI 21.77 kg/m   General: 58 year old female in no acute distress Eyes: Sclera nonicteric, conjunctiva pink Mouth: No ulcers or lesions Neck: Supple, no lymphadenopathy Heart: Regular rate and rhythm, no murmurs Lungs: Breath sounds clear throughout Abdomen: Soft, nondistended, moderate left lower quadrant tenderness with slight rebound component, no guarding.  Negative shake tenderness.  Positive bowel sounds to all 4 quadrants.  The patient was tearful after I pressed on the left lower quadrant. Extremities: No edema Neuro: Alert and oriented x4, no focal deficits     Assessment & Plan:   68.  58 year old female with left lower quadrant abdominal pain  -Stat abdominal/pelvic CT with oral and IV contrast -Patient to [facility] to [facility] emergency room if her left lower quadrant abdominal pain worsens -CBC, CMP and CRP now -We will follow up in the office in 2 to 3 weeks, a colonoscopy will be scheduled at that time -Further recommendations to be determined after CT and lab results received -Tylenol 500 mg 2 tabs p.o. twice daily for pain  2.  Nausea -Continue pantoprazole 40 mg p.o. twice daily. -Famotidine 20 mg at bedtime -If nausea persists will add Zofran  3.  Dysphagia -Schedule EGD at time of colonoscopy, will schedule at time of follow-up in 2 to 3 weeks  4.  Constipation, stated has resolved -Continue lactulose twice daily as tolerated for now  5.  History of alcohol abuse, no alcohol for the past 5 to 6 months -No alcohol recommended  6. History of alcoholic pancreatitis

## 2019-07-29 ENCOUNTER — Ambulatory Visit (HOSPITAL_COMMUNITY)
Admission: RE | Admit: 2019-07-29 | Discharge: 2019-07-29 | Disposition: A | Discharge status: Home / Self Care | Payer: Medicare Other | Source: Ambulatory Visit | Attending: Nurse Practitioner | Admitting: Nurse Practitioner

## 2019-07-29 ENCOUNTER — Other Ambulatory Visit (HOSPITAL_COMMUNITY): Payer: Medicare Other

## 2019-07-29 ENCOUNTER — Telehealth (INDEPENDENT_AMBULATORY_CARE_PROVIDER_SITE_OTHER): Payer: Self-pay | Admitting: Nurse Practitioner

## 2019-07-29 ENCOUNTER — Other Ambulatory Visit (INDEPENDENT_AMBULATORY_CARE_PROVIDER_SITE_OTHER): Payer: Self-pay | Admitting: Nurse Practitioner

## 2019-07-29 DIAGNOSIS — R1032 Left lower quadrant pain: Secondary | ICD-10-CM | POA: Diagnosis present

## 2019-07-29 LAB — CBC WITH DIFFERENTIAL/PLATELET
Absolute Monocytes: 822 cells/uL (ref 200–950)
Basophils Absolute: 91 cells/uL (ref 0–200)
Basophils Relative: 1.1 %
Eosinophils Absolute: 324 cells/uL (ref 15–500)
Eosinophils Relative: 3.9 %
HCT: 37.1 % (ref 35.0–45.0)
Hemoglobin: 12.1 g/dL (ref 11.7–15.5)
Lymphs Abs: 3386 cells/uL (ref 850–3900)
MCH: 29.8 pg (ref 27.0–33.0)
MCHC: 32.6 g/dL (ref 32.0–36.0)
MCV: 91.4 fL (ref 80.0–100.0)
MPV: 12 fL (ref 7.5–12.5)
Monocytes Relative: 9.9 %
Neutro Abs: 3677 cells/uL (ref 1500–7800)
Neutrophils Relative %: 44.3 %
Platelets: 295 10*3/uL (ref 140–400)
RBC: 4.06 10*6/uL (ref 3.80–5.10)
RDW: 14 % (ref 11.0–15.0)
Total Lymphocyte: 40.8 %
WBC: 8.3 10*3/uL (ref 3.8–10.8)

## 2019-07-29 LAB — COMPREHENSIVE METABOLIC PANEL
AG Ratio: 1.8 (calc) (ref 1.0–2.5)
ALT: 17 U/L (ref 6–29)
AST: 21 U/L (ref 10–35)
Albumin: 4.3 g/dL (ref 3.6–5.1)
Alkaline phosphatase (APISO): 92 U/L (ref 37–153)
BUN/Creatinine Ratio: 14 (calc) (ref 6–22)
BUN: 16 mg/dL (ref 7–25)
CO2: 28 mmol/L (ref 20–32)
Calcium: 9.6 mg/dL (ref 8.6–10.4)
Chloride: 107 mmol/L (ref 98–110)
Creat: 1.13 mg/dL — ABNORMAL HIGH (ref 0.50–1.05)
Globulin: 2.4 g/dL (calc) (ref 1.9–3.7)
Glucose, Bld: 89 mg/dL (ref 65–139)
Potassium: 3.8 mmol/L (ref 3.5–5.3)
Sodium: 145 mmol/L (ref 135–146)
Total Bilirubin: 0.3 mg/dL (ref 0.2–1.2)
Total Protein: 6.7 g/dL (ref 6.1–8.1)

## 2019-07-29 LAB — C-REACTIVE PROTEIN: CRP: 8.1 mg/L — ABNORMAL HIGH (ref <8.0)

## 2019-07-29 MED ORDER — IOHEXOL 300 MG/ML  SOLN
75.0000 mL | Freq: Once | INTRAMUSCULAR | Status: AC | PRN
  Administered 2019-07-29: 75 mL via INTRAVENOUS

## 2019-07-29 MED ORDER — DICYCLOMINE HCL 10 MG PO CAPS: 10.0000 mg | ORAL_CAPSULE | Freq: Two times a day (BID) | ORAL | 0 refills | Status: DC | PRN

## 2019-07-29 NOTE — Telephone Encounter (Signed)
I called the patient and discussed her abdominal/pelvic CT was negative, no evidence of diverticulitis or any intra-abdominal infectious or inflammatory process.  I will send a prescription for dicyclomine 10 mg 1 p.o. twice daily as needed.  She will follow up in the office in 2 weeks to schedule her EGD and colonoscopy.  She will push fluids and hydrate well today.

## 2019-08-03 ENCOUNTER — Telehealth: Payer: Self-pay | Admitting: Nurse Practitioner

## 2019-08-03 NOTE — Telephone Encounter (Signed)
This patient has called into the office stating she needs to be be scheduled for an EGD/colon-which provider does this patient need to be scheduled with for her procedure? Please advise

## 2019-08-05 ENCOUNTER — Other Ambulatory Visit (HOSPITAL_COMMUNITY): Payer: Self-pay | Admitting: Internal Medicine

## 2019-08-05 DIAGNOSIS — Z1231 Encounter for screening mammogram for malignant neoplasm of breast: Secondary | ICD-10-CM

## 2019-08-06 ENCOUNTER — Other Ambulatory Visit (INDEPENDENT_AMBULATORY_CARE_PROVIDER_SITE_OTHER): Payer: Self-pay | Admitting: Nurse Practitioner

## 2019-08-06 DIAGNOSIS — R1032 Left lower quadrant pain: Secondary | ICD-10-CM

## 2019-08-06 DIAGNOSIS — R1319 Other dysphagia: Secondary | ICD-10-CM

## 2019-08-06 DIAGNOSIS — R1310 Dysphagia, unspecified: Secondary | ICD-10-CM

## 2019-08-06 NOTE — Telephone Encounter (Signed)
Sarah Ochoa is a patient of Dr. Olevia Perches in [place].   Ann, pls call pt to schedule an EGD and colonoscopy at [facility] as her abd/pelvic CT did not show diverticulitis.

## 2019-08-09 DIAGNOSIS — R1310 Dysphagia, unspecified: Secondary | ICD-10-CM | POA: Insufficient documentation

## 2019-08-09 DIAGNOSIS — R1319 Other dysphagia: Secondary | ICD-10-CM | POA: Insufficient documentation

## 2019-08-10 NOTE — Telephone Encounter (Signed)
TCS/EGD sch'd 09/23/19, patient aware

## 2019-08-18 ENCOUNTER — Other Ambulatory Visit (INDEPENDENT_AMBULATORY_CARE_PROVIDER_SITE_OTHER): Payer: Self-pay | Admitting: *Deleted

## 2019-08-18 ENCOUNTER — Telehealth (INDEPENDENT_AMBULATORY_CARE_PROVIDER_SITE_OTHER): Payer: Self-pay | Admitting: *Deleted

## 2019-08-18 ENCOUNTER — Encounter (INDEPENDENT_AMBULATORY_CARE_PROVIDER_SITE_OTHER): Payer: Self-pay | Admitting: *Deleted

## 2019-08-18 DIAGNOSIS — Z1211 Encounter for screening for malignant neoplasm of colon: Secondary | ICD-10-CM

## 2019-08-18 MED ORDER — SUPREP BOWEL PREP KIT 17.5-3.13-1.6 GM/177ML PO SOLN: 1.0000 | Freq: Once | ORAL | 0 refills | Status: AC

## 2019-08-18 NOTE — Telephone Encounter (Signed)
Patient needs suprep TCS/EGD sch'd 09/23/19

## 2019-08-19 ENCOUNTER — Other Ambulatory Visit (INDEPENDENT_AMBULATORY_CARE_PROVIDER_SITE_OTHER): Payer: Self-pay | Admitting: Nurse Practitioner

## 2019-08-19 DIAGNOSIS — R1013 Epigastric pain: Secondary | ICD-10-CM

## 2019-08-23 ENCOUNTER — Telehealth: Payer: Self-pay | Admitting: Adult Health

## 2019-08-23 NOTE — Telephone Encounter (Signed)
Tried to reach the patient to remind her of her appointment/restrictions, mailbox is full.

## 2019-08-24 ENCOUNTER — Ambulatory Visit: Payer: Medicare Other | Admitting: Adult Health

## 2019-08-26 ENCOUNTER — Encounter (INDEPENDENT_AMBULATORY_CARE_PROVIDER_SITE_OTHER): Payer: Self-pay | Admitting: *Deleted

## 2019-08-30 ENCOUNTER — Ambulatory Visit (HOSPITAL_COMMUNITY): Payer: Medicare Other

## 2019-09-20 ENCOUNTER — Other Ambulatory Visit (INDEPENDENT_AMBULATORY_CARE_PROVIDER_SITE_OTHER): Payer: Self-pay | Admitting: *Deleted

## 2019-09-21 ENCOUNTER — Other Ambulatory Visit: Payer: Self-pay

## 2019-09-21 ENCOUNTER — Other Ambulatory Visit (HOSPITAL_COMMUNITY): Payer: Medicare Other

## 2019-09-21 ENCOUNTER — Other Ambulatory Visit (HOSPITAL_COMMUNITY)
Admission: RE | Admit: 2019-09-21 | Discharge: 2019-09-21 | Disposition: A | Discharge status: Home / Self Care | Payer: Medicare Other | Source: Ambulatory Visit | Attending: Internal Medicine | Admitting: Internal Medicine

## 2019-09-22 ENCOUNTER — Other Ambulatory Visit: Payer: Self-pay

## 2019-09-22 ENCOUNTER — Other Ambulatory Visit (HOSPITAL_COMMUNITY)
Admission: RE | Admit: 2019-09-22 | Discharge: 2019-09-22 | Disposition: A | Discharge status: Home / Self Care | Payer: Medicare Other | Source: Ambulatory Visit | Attending: Internal Medicine | Admitting: Internal Medicine

## 2019-09-22 ENCOUNTER — Encounter (INDEPENDENT_AMBULATORY_CARE_PROVIDER_SITE_OTHER): Payer: Self-pay | Admitting: *Deleted

## 2019-09-23 ENCOUNTER — Other Ambulatory Visit (INDEPENDENT_AMBULATORY_CARE_PROVIDER_SITE_OTHER): Payer: Self-pay | Admitting: *Deleted

## 2019-09-23 DIAGNOSIS — R1310 Dysphagia, unspecified: Secondary | ICD-10-CM

## 2019-09-23 DIAGNOSIS — R1032 Left lower quadrant pain: Secondary | ICD-10-CM | POA: Insufficient documentation

## 2019-09-30 ENCOUNTER — Other Ambulatory Visit: Payer: Self-pay

## 2019-09-30 ENCOUNTER — Ambulatory Visit (HOSPITAL_COMMUNITY)
Admission: RE | Admit: 2019-09-30 | Discharge: 2019-09-30 | Disposition: A | Discharge status: Home / Self Care | Payer: Medicare Other | Source: Ambulatory Visit | Attending: Internal Medicine | Admitting: Internal Medicine

## 2019-09-30 DIAGNOSIS — Z1231 Encounter for screening mammogram for malignant neoplasm of breast: Secondary | ICD-10-CM | POA: Diagnosis present

## 2019-10-05 ENCOUNTER — Other Ambulatory Visit (INDEPENDENT_AMBULATORY_CARE_PROVIDER_SITE_OTHER): Payer: Self-pay | Admitting: *Deleted

## 2019-10-27 ENCOUNTER — Telehealth: Payer: Self-pay | Admitting: Adult Health

## 2019-10-27 NOTE — Telephone Encounter (Signed)

## 2019-10-28 ENCOUNTER — Ambulatory Visit: Payer: Medicare Other | Admitting: Adult Health

## 2019-11-01 ENCOUNTER — Other Ambulatory Visit: Payer: Self-pay

## 2019-11-01 ENCOUNTER — Other Ambulatory Visit (HOSPITAL_COMMUNITY): Payer: Medicare Other

## 2019-11-01 ENCOUNTER — Other Ambulatory Visit (HOSPITAL_COMMUNITY)
Admission: RE | Admit: 2019-11-01 | Discharge: 2019-11-01 | Disposition: A | Discharge status: Home / Self Care | Payer: Medicare Other | Source: Ambulatory Visit | Attending: Internal Medicine | Admitting: Internal Medicine

## 2019-11-01 DIAGNOSIS — Z01812 Encounter for preprocedural laboratory examination: Secondary | ICD-10-CM | POA: Insufficient documentation

## 2019-11-01 DIAGNOSIS — Z20822 Contact with and (suspected) exposure to covid-19: Secondary | ICD-10-CM | POA: Insufficient documentation

## 2019-11-01 LAB — SARS CORONAVIRUS 2 (TAT 6-24 HRS): SARS Coronavirus 2: NEGATIVE

## 2019-11-03 ENCOUNTER — Ambulatory Visit (HOSPITAL_COMMUNITY): Admission: RE | Admit: 2019-11-03 | Payer: Medicare Other | Source: Ambulatory Visit | Admitting: Internal Medicine

## 2019-11-03 ENCOUNTER — Encounter (HOSPITAL_COMMUNITY): Admission: RE | Payer: Self-pay | Source: Ambulatory Visit

## 2019-11-03 SURGERY — EGD (ESOPHAGOGASTRODUODENOSCOPY)
Anesthesia: Moderate Sedation

## 2019-11-08 ENCOUNTER — Other Ambulatory Visit: Payer: Self-pay

## 2019-11-08 ENCOUNTER — Ambulatory Visit (INDEPENDENT_AMBULATORY_CARE_PROVIDER_SITE_OTHER): Payer: Medicare Other | Admitting: Adult Health

## 2019-11-08 ENCOUNTER — Encounter: Payer: Self-pay | Admitting: Adult Health

## 2019-11-08 VITALS — BP 128/70 | HR 64 | Ht 65.0 in | Wt 127.0 lb

## 2019-11-08 DIAGNOSIS — R232 Flushing: Secondary | ICD-10-CM

## 2019-11-08 DIAGNOSIS — Z1212 Encounter for screening for malignant neoplasm of rectum: Secondary | ICD-10-CM | POA: Diagnosis not present

## 2019-11-08 DIAGNOSIS — Z1211 Encounter for screening for malignant neoplasm of colon: Secondary | ICD-10-CM | POA: Insufficient documentation

## 2019-11-08 DIAGNOSIS — Z01419 Encounter for gynecological examination (general) (routine) without abnormal findings: Secondary | ICD-10-CM | POA: Insufficient documentation

## 2019-11-08 DIAGNOSIS — R4589 Other symptoms and signs involving emotional state: Secondary | ICD-10-CM | POA: Diagnosis not present

## 2019-11-08 DIAGNOSIS — Z7189 Other specified counseling: Secondary | ICD-10-CM

## 2019-11-08 LAB — HEMOCCULT GUIAC POC 1CARD (OFFICE): Fecal Occult Blood, POC: NEGATIVE

## 2019-11-08 MED ORDER — ESTRADIOL 1 MG PO TABS: 1.0000 mg | ORAL_TABLET | Freq: Every day | ORAL | 6 refills | Status: DC

## 2019-11-08 NOTE — Progress Notes (Signed)
Patient ID: Sarah Ochoa, female   DOB: 01/23/1961, 59 y.o.   MRN: [id number] History of Present Illness: Sarah Ochoa is a 59 year old black female, divorced, sp hysterectomy, in for a pelvic exam. PCP is Dr Legrand Rams.    Current Medications, Allergies, Past Medical History, Past Surgical History, Family History and Social History were reviewed in [employer] record.     Review of Systems:  Patient denies any headaches, hearing loss, fatigue, blurred vision, shortness of breath, chest pain, abdominal pain, problems with bowel movements, urination, or intercourse. No joint pain. +hot flashes +moody    Physical Exam:BP 128/70 (BP Location: Left Arm, Patient Position: Sitting, Cuff Size: Normal)   Pulse 64   Ht 5\' 5"  (1.651 m)   Wt 127 lb (57.6 kg)   BMI 21.13 kg/m  General:  Well developed, well nourished, no acute distress Skin:  Warm and dry Neck:  Midline trachea, normal thyroid, good ROM, no lymphadenopathy Lungs; Clear to auscultation bilaterally Cardiovascular: Regular rate and rhythm Pelvic:  External genitalia is normal in appearance, no lesions.  The vagina is pale with loss of moisture and rugae, vaginal cuff looks good. Urethra has no lesions or masses. The cervix and uterus are absent. No adnexal masses or tenderness noted.Bladder is non tender, no masses felt. Rectal: Good sphincter tone, no polyps, or hemorrhoids felt.  Hemoccult negative. Extremities/musculoskeletal:  No swelling or varicosities noted, no clubbing or cyanosis Psych:  alert and cooperative,seems happy Fall risk is moderate. PHQ 9 score is 5, denies being suicidal, just moody   Impression and Plan 1. Normal pelvic exam Physical and labs with PCP Mammogram yearly No pap needed   2. Screening for colorectal cancer Needs colonoscopy, had one scheduled and she cancelled   3. Moody Will add estrogen  4. Hot flashes Will rx estrogen Meds ordered this encounter  Medications  .  estradiol (ESTRACE) 1 MG tablet    Sig: Take 1 tablet (1 mg total) by mouth daily.    Dispense:  30 tablet    Refill:  6    Order Specific Question:   Supervising Provider    Answer:   Elonda Husky, LUTHER H [[id number]]    5. Counseling for estrogen replacement therapy Discussed trying estrogen, she denies MI,stroke, DVT or breast cancer and wants to try  Follow up in 8 weeks for ROS

## 2019-11-30 ENCOUNTER — Other Ambulatory Visit: Payer: Self-pay | Admitting: Adult Health

## 2019-12-31 ENCOUNTER — Telehealth: Payer: Self-pay | Admitting: Adult Health

## 2019-12-31 NOTE — Telephone Encounter (Signed)

## 2020-01-03 ENCOUNTER — Ambulatory Visit: Payer: Medicare Other | Admitting: Adult Health

## 2020-01-06 ENCOUNTER — Telehealth: Payer: Self-pay | Admitting: Adult Health

## 2020-01-06 NOTE — Telephone Encounter (Signed)

## 2020-01-10 ENCOUNTER — Ambulatory Visit: Payer: Medicare Other | Admitting: Adult Health

## 2020-01-12 ENCOUNTER — Telehealth: Payer: Self-pay | Admitting: Adult Health

## 2020-01-12 NOTE — Telephone Encounter (Signed)
Tried to call the patient and remind her of her appointment/restrictions, mailbox is full.

## 2020-01-13 ENCOUNTER — Ambulatory Visit: Payer: Medicare Other | Admitting: Adult Health

## 2020-03-29 ENCOUNTER — Emergency Department (HOSPITAL_COMMUNITY)
Admission: EM | Admit: 2020-03-29 | Discharge: 2020-03-29 | Disposition: A | Discharge status: Home / Self Care | Payer: Medicare Other | Attending: Emergency Medicine | Admitting: Emergency Medicine

## 2020-03-29 ENCOUNTER — Other Ambulatory Visit: Payer: Self-pay

## 2020-03-29 ENCOUNTER — Encounter (HOSPITAL_COMMUNITY): Payer: Self-pay | Admitting: Emergency Medicine

## 2020-03-29 DIAGNOSIS — K5900 Constipation, unspecified: Secondary | ICD-10-CM | POA: Insufficient documentation

## 2020-03-29 DIAGNOSIS — K649 Unspecified hemorrhoids: Secondary | ICD-10-CM | POA: Diagnosis not present

## 2020-03-29 DIAGNOSIS — Z5321 Procedure and treatment not carried out due to patient leaving prior to being seen by health care provider: Secondary | ICD-10-CM | POA: Insufficient documentation

## 2020-03-29 DIAGNOSIS — K921 Melena: Secondary | ICD-10-CM | POA: Insufficient documentation

## 2020-03-29 LAB — URINALYSIS, ROUTINE W REFLEX MICROSCOPIC
Bacteria, UA: NONE SEEN
Bilirubin Urine: NEGATIVE
Glucose, UA: NEGATIVE mg/dL
Ketones, ur: NEGATIVE mg/dL
Leukocytes,Ua: NEGATIVE
Nitrite: NEGATIVE
Protein, ur: NEGATIVE mg/dL
Specific Gravity, Urine: 1.026 (ref 1.005–1.030)
pH: 5 (ref 5.0–8.0)

## 2020-03-29 NOTE — ED Triage Notes (Signed)
Pt c/o of constipations and bright red blood with BM for the past 3 days. Pt states she may have hemorrhoids. Pt also states she has vaginal burning and itching.

## 2020-07-03 ENCOUNTER — Other Ambulatory Visit: Payer: Self-pay

## 2020-07-03 ENCOUNTER — Other Ambulatory Visit (HOSPITAL_COMMUNITY): Payer: Self-pay | Admitting: Gerontology

## 2020-07-03 ENCOUNTER — Ambulatory Visit (HOSPITAL_COMMUNITY)
Admission: RE | Admit: 2020-07-03 | Discharge: 2020-07-03 | Disposition: A | Discharge status: Home / Self Care | Payer: Medicare Other | Source: Ambulatory Visit | Attending: Gerontology | Admitting: Gerontology

## 2020-07-03 DIAGNOSIS — R059 Cough, unspecified: Secondary | ICD-10-CM | POA: Diagnosis present

## 2020-08-14 ENCOUNTER — Other Ambulatory Visit (HOSPITAL_COMMUNITY): Payer: Self-pay | Admitting: Gerontology

## 2020-08-14 ENCOUNTER — Other Ambulatory Visit: Payer: Self-pay

## 2020-08-14 ENCOUNTER — Ambulatory Visit (HOSPITAL_COMMUNITY)
Admission: RE | Admit: 2020-08-14 | Discharge: 2020-08-14 | Disposition: A | Discharge status: Home / Self Care | Payer: Medicare Other | Source: Ambulatory Visit | Attending: Gerontology | Admitting: Gerontology

## 2020-08-14 DIAGNOSIS — J449 Chronic obstructive pulmonary disease, unspecified: Secondary | ICD-10-CM | POA: Diagnosis not present

## 2020-08-14 DIAGNOSIS — M25512 Pain in left shoulder: Secondary | ICD-10-CM | POA: Diagnosis not present

## 2020-08-14 DIAGNOSIS — S42302A Unspecified fracture of shaft of humerus, left arm, initial encounter for closed fracture: Secondary | ICD-10-CM | POA: Diagnosis not present

## 2020-08-14 DIAGNOSIS — I10 Essential (primary) hypertension: Secondary | ICD-10-CM | POA: Diagnosis not present

## 2020-08-28 DIAGNOSIS — Z743 Need for continuous supervision: Secondary | ICD-10-CM | POA: Diagnosis not present

## 2020-08-28 DIAGNOSIS — I498 Other specified cardiac arrhythmias: Secondary | ICD-10-CM | POA: Diagnosis not present

## 2020-08-28 DIAGNOSIS — I499 Cardiac arrhythmia, unspecified: Secondary | ICD-10-CM | POA: Diagnosis not present

## 2020-08-28 DIAGNOSIS — E785 Hyperlipidemia, unspecified: Secondary | ICD-10-CM | POA: Diagnosis not present

## 2020-08-28 DIAGNOSIS — Z79899 Other long term (current) drug therapy: Secondary | ICD-10-CM | POA: Diagnosis not present

## 2020-08-28 DIAGNOSIS — R6889 Other general symptoms and signs: Secondary | ICD-10-CM | POA: Diagnosis not present

## 2020-08-28 DIAGNOSIS — R0789 Other chest pain: Secondary | ICD-10-CM | POA: Diagnosis not present

## 2020-08-28 DIAGNOSIS — I10 Essential (primary) hypertension: Secondary | ICD-10-CM | POA: Diagnosis not present

## 2020-08-28 DIAGNOSIS — R9431 Abnormal electrocardiogram [ECG] [EKG]: Secondary | ICD-10-CM | POA: Diagnosis not present

## 2020-08-28 DIAGNOSIS — R079 Chest pain, unspecified: Secondary | ICD-10-CM | POA: Diagnosis not present

## 2020-08-28 DIAGNOSIS — Z882 Allergy status to sulfonamides status: Secondary | ICD-10-CM | POA: Diagnosis not present

## 2020-08-28 DIAGNOSIS — J449 Chronic obstructive pulmonary disease, unspecified: Secondary | ICD-10-CM | POA: Diagnosis not present

## 2020-08-28 DIAGNOSIS — Z885 Allergy status to narcotic agent status: Secondary | ICD-10-CM | POA: Diagnosis not present

## 2020-08-28 DIAGNOSIS — Z87891 Personal history of nicotine dependence: Secondary | ICD-10-CM | POA: Diagnosis not present

## 2020-08-28 DIAGNOSIS — R001 Bradycardia, unspecified: Secondary | ICD-10-CM | POA: Diagnosis not present

## 2020-08-31 ENCOUNTER — Encounter: Payer: Self-pay | Admitting: Orthopedic Surgery

## 2020-09-13 DIAGNOSIS — I10 Essential (primary) hypertension: Secondary | ICD-10-CM | POA: Diagnosis not present

## 2020-09-13 DIAGNOSIS — K219 Gastro-esophageal reflux disease without esophagitis: Secondary | ICD-10-CM | POA: Diagnosis not present

## 2020-09-25 ENCOUNTER — Other Ambulatory Visit (HOSPITAL_COMMUNITY): Payer: Self-pay | Admitting: Internal Medicine

## 2020-09-25 DIAGNOSIS — Z0001 Encounter for general adult medical examination with abnormal findings: Secondary | ICD-10-CM | POA: Diagnosis not present

## 2020-09-25 DIAGNOSIS — K219 Gastro-esophageal reflux disease without esophagitis: Secondary | ICD-10-CM | POA: Diagnosis not present

## 2020-09-25 DIAGNOSIS — Z1231 Encounter for screening mammogram for malignant neoplasm of breast: Secondary | ICD-10-CM

## 2020-09-25 DIAGNOSIS — F17210 Nicotine dependence, cigarettes, uncomplicated: Secondary | ICD-10-CM | POA: Diagnosis not present

## 2020-09-25 DIAGNOSIS — I10 Essential (primary) hypertension: Secondary | ICD-10-CM | POA: Diagnosis not present

## 2020-09-25 DIAGNOSIS — Z23 Encounter for immunization: Secondary | ICD-10-CM | POA: Diagnosis not present

## 2020-09-25 DIAGNOSIS — J449 Chronic obstructive pulmonary disease, unspecified: Secondary | ICD-10-CM | POA: Diagnosis not present

## 2020-09-25 DIAGNOSIS — Z1389 Encounter for screening for other disorder: Secondary | ICD-10-CM | POA: Diagnosis not present

## 2020-09-28 ENCOUNTER — Ambulatory Visit: Payer: Medicare Other | Admitting: Orthopaedic Surgery

## 2020-09-28 DIAGNOSIS — I10 Essential (primary) hypertension: Secondary | ICD-10-CM | POA: Diagnosis not present

## 2020-09-28 DIAGNOSIS — Z0001 Encounter for general adult medical examination with abnormal findings: Secondary | ICD-10-CM | POA: Diagnosis not present

## 2020-09-28 DIAGNOSIS — Z79899 Other long term (current) drug therapy: Secondary | ICD-10-CM | POA: Diagnosis not present

## 2020-09-28 DIAGNOSIS — H5213 Myopia, bilateral: Secondary | ICD-10-CM | POA: Diagnosis not present

## 2020-10-05 ENCOUNTER — Encounter (INDEPENDENT_AMBULATORY_CARE_PROVIDER_SITE_OTHER): Payer: Self-pay | Admitting: *Deleted

## 2020-10-05 ENCOUNTER — Ambulatory Visit (HOSPITAL_COMMUNITY): Payer: Medicare Other

## 2020-10-10 ENCOUNTER — Encounter: Payer: Self-pay | Admitting: Orthopaedic Surgery

## 2020-10-10 ENCOUNTER — Ambulatory Visit (INDEPENDENT_AMBULATORY_CARE_PROVIDER_SITE_OTHER): Payer: Medicare Other | Admitting: Orthopaedic Surgery

## 2020-10-10 ENCOUNTER — Other Ambulatory Visit: Payer: Self-pay

## 2020-10-10 VITALS — BP 134/95 | HR 102 | Ht 65.0 in | Wt 127.0 lb

## 2020-10-10 DIAGNOSIS — G8929 Other chronic pain: Secondary | ICD-10-CM

## 2020-10-10 DIAGNOSIS — M25512 Pain in left shoulder: Secondary | ICD-10-CM

## 2020-10-10 MED ORDER — HYDROCODONE-ACETAMINOPHEN 5-325 MG PO TABS: 1.0000 | ORAL_TABLET | ORAL | 0 refills | Status: AC | PRN
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
## 2020-10-12 ENCOUNTER — Ambulatory Visit (HOSPITAL_COMMUNITY): Payer: Medicare Other

## 2020-10-26 DIAGNOSIS — J449 Chronic obstructive pulmonary disease, unspecified: Secondary | ICD-10-CM | POA: Diagnosis not present

## 2020-10-26 DIAGNOSIS — M1990 Unspecified osteoarthritis, unspecified site: Secondary | ICD-10-CM | POA: Diagnosis not present

## 2020-11-01 ENCOUNTER — Ambulatory Visit (INDEPENDENT_AMBULATORY_CARE_PROVIDER_SITE_OTHER): Payer: Medicare Other | Admitting: Orthopaedic Surgery

## 2020-11-01 ENCOUNTER — Encounter: Payer: Self-pay | Admitting: Orthopaedic Surgery

## 2020-11-01 ENCOUNTER — Other Ambulatory Visit: Payer: Self-pay

## 2020-11-01 VITALS — BP 176/136 | HR 72 | Ht 65.0 in | Wt 130.0 lb

## 2020-11-01 DIAGNOSIS — M25512 Pain in left shoulder: Secondary | ICD-10-CM | POA: Diagnosis not present

## 2020-11-01 DIAGNOSIS — G8929 Other chronic pain: Secondary | ICD-10-CM

## 2020-11-01 MED ORDER — HYDROCODONE-ACETAMINOPHEN 5-325 MG PO TABS
1.0000 | ORAL_TABLET | ORAL | 0 refills | Status: AC | PRN
Start: 2020-11-01 — End: 2020-11-06

## 2020-11-01 NOTE — Addendum Note (Signed)
Addended by: Derek Mound A on: 11/01/2020 10:54 AM   Modules accepted: Orders

## 2020-11-01 NOTE — Progress Notes (Signed)
PROCEDURE NOTE:  The patient request injection, verbal consent was obtained.  The left shoulder was prepped appropriately after time out was performed.   Sterile technique was observed and injection of 1 cc of Celestone 6 mg with several cc's of plain xylocaine. Anesthesia was provided by ethyl chloride and a 20-gauge needle was used to inject the shoulder area. A posterior approach was used.  The injection was tolerated well.  A band aid dressing was applied.  The patient was advised to apply ice later today and tomorrow to the injection sight as needed.  I will refill her pain medicine.  I have reviewed the [place] web site prior to prescribing narcotic medicine for this patient.     Get MRI of the left shoulder.  Call if any problem.  Precautions discussed.   Electronically Signed Sanjuana Kava, MD 2/16/202210:31 AM

## 2020-11-01 NOTE — Patient Instructions (Signed)

## 2020-11-15 ENCOUNTER — Ambulatory Visit (HOSPITAL_COMMUNITY)
Admission: RE | Admit: 2020-11-15 | Discharge: 2020-11-15 | Disposition: A | Discharge status: Home / Self Care | Payer: Medicare Other | Source: Ambulatory Visit | Attending: Orthopaedic Surgery | Admitting: Orthopaedic Surgery

## 2020-11-15 ENCOUNTER — Other Ambulatory Visit: Payer: Self-pay

## 2020-11-15 DIAGNOSIS — M25512 Pain in left shoulder: Secondary | ICD-10-CM | POA: Diagnosis not present

## 2020-11-15 DIAGNOSIS — M19012 Primary osteoarthritis, left shoulder: Secondary | ICD-10-CM | POA: Diagnosis not present

## 2020-11-15 DIAGNOSIS — G8929 Other chronic pain: Secondary | ICD-10-CM | POA: Insufficient documentation

## 2020-11-15 DIAGNOSIS — M25712 Osteophyte, left shoulder: Secondary | ICD-10-CM | POA: Diagnosis not present

## 2020-11-20 DIAGNOSIS — H40023 Open angle with borderline findings, high risk, bilateral: Secondary | ICD-10-CM | POA: Diagnosis not present

## 2020-11-20 DIAGNOSIS — H2513 Age-related nuclear cataract, bilateral: Secondary | ICD-10-CM | POA: Diagnosis not present

## 2020-11-21 ENCOUNTER — Other Ambulatory Visit: Payer: Self-pay

## 2020-11-21 ENCOUNTER — Encounter: Payer: Self-pay | Admitting: Orthopaedic Surgery

## 2020-11-21 ENCOUNTER — Ambulatory Visit (INDEPENDENT_AMBULATORY_CARE_PROVIDER_SITE_OTHER): Payer: Medicare Other | Admitting: Orthopaedic Surgery

## 2020-11-21 VITALS — BP 156/105 | HR 82 | Ht 65.0 in | Wt 130.0 lb

## 2020-11-21 DIAGNOSIS — R252 Cramp and spasm: Secondary | ICD-10-CM

## 2020-11-21 DIAGNOSIS — M25512 Pain in left shoulder: Secondary | ICD-10-CM | POA: Diagnosis not present

## 2020-11-21 DIAGNOSIS — G8929 Other chronic pain: Secondary | ICD-10-CM

## 2020-11-21 DIAGNOSIS — F17210 Nicotine dependence, cigarettes, uncomplicated: Secondary | ICD-10-CM

## 2020-11-21 MED ORDER — HYDROCODONE-ACETAMINOPHEN 5-325 MG PO TABS
ORAL_TABLET | ORAL | 0 refills | Status: DC
Start: 2020-11-21 — End: 2020-11-23

## 2020-11-21 MED ORDER — TIZANIDINE HCL 4 MG PO TABS: ORAL_TABLET | ORAL | 3 refills | Status: DC

## 2020-11-21 NOTE — Patient Instructions (Signed)
Steps to Quit Smoking Smoking tobacco is the leading cause of preventable death. It can affect almost every organ in the body. Smoking puts you and people around you at risk for many serious, long-lasting (chronic) diseases. Quitting smoking can be hard, but it is one of the best things that you can do for your health. It is never too late to quit. How do I get ready to quit? When you decide to quit smoking, make a plan to help you succeed. Before you quit:  Pick a date to quit. Set a date within the next 2 weeks to give you time to prepare.  Write down the reasons why you are quitting. Keep this list in places where you will see it often.  Tell your family, friends, and co-workers that you are quitting. Their support is important.  Talk with your doctor about the choices that may help you quit.  Find out if your health insurance will pay for these treatments.  Know the people, places, things, and activities that make you want to smoke (triggers). Avoid them. What first steps can I take to quit smoking?  Throw away all cigarettes at home, at work, and in your car.  Throw away the things that you use when you smoke, such as ashtrays and lighters.  Clean your car. Make sure to empty the ashtray.  Clean your home, including curtains and carpets. What can I do to help me quit smoking? Talk with your doctor about taking medicines and seeing a counselor at the same time. You are more likely to succeed when you do both.  If you are pregnant or breastfeeding, talk with your doctor about counseling or other ways to quit smoking. Do not take medicine to help you quit smoking unless your doctor tells you to do so. To quit smoking: Quit right away  Quit smoking totally, instead of slowly cutting back on how much you smoke over a period of time.  Go to counseling. You are more likely to quit if you go to counseling sessions regularly. Take medicine You may take medicines to help you quit. Some  medicines need a prescription, and some you can buy over-the-counter. Some medicines may contain a drug called nicotine to replace the nicotine in cigarettes. Medicines may:  Help you to stop having the desire to smoke (cravings).  Help to stop the problems that come when you stop smoking (withdrawal symptoms). Your doctor may ask you to use:  Nicotine patches, gum, or lozenges.  Nicotine inhalers or sprays.  Non-nicotine medicine that is taken by mouth. Find resources Find resources and other ways to help you quit smoking and remain smoke-free after you quit. These resources are most helpful when you use them often. They include:  Online chats with a counselor.  Phone quitlines.  Printed self-help materials.  Support groups or group counseling.  Text messaging programs.  Mobile phone apps. Use apps on your mobile phone or tablet that can help you stick to your quit plan. There are many free apps for mobile phones and tablets as well as websites. Examples include Quit Guide from the CDC and smokefree.gov   What things can I do to make it easier to quit?  Talk to your family and friends. Ask them to support and encourage you.  Call a phone quitline (1-800-QUIT-NOW), reach out to support groups, or work with a counselor.  Ask people who smoke to not smoke around you.  Avoid places that make you want to smoke,   such as: ? Bars. ? Parties. ? Smoke-break areas at work.  Spend time with people who do not smoke.  Lower the stress in your life. Stress can make you want to smoke. Try these things to help your stress: ? Getting regular exercise. ? Doing deep-breathing exercises. ? Doing yoga. ? Meditating. ? Doing a body scan. To do this, close your eyes, focus on one area of your body at a time from head to toe. Notice which parts of your body are tense. Try to relax the muscles in those areas.   How will I feel when I quit smoking? Day 1 to 3 weeks Within the first 24 hours,  you may start to have some problems that come from quitting tobacco. These problems are very bad 2-3 days after you quit, but they do not often last for more than 2-3 weeks. You may get these symptoms:  Mood swings.  Feeling restless, nervous, angry, or annoyed.  Trouble concentrating.  Dizziness.  Strong desire for high-sugar foods and nicotine.  Weight gain.  Trouble pooping (constipation).  Feeling like you may vomit (nausea).  Coughing or a sore throat.  Changes in how the medicines that you take for other issues work in your body.  Depression.  Trouble sleeping (insomnia). Week 3 and afterward After the first 2-3 weeks of quitting, you may start to notice more positive results, such as:  Better sense of smell and taste.  Less coughing and sore throat.  Slower heart rate.  Lower blood pressure.  Clearer skin.  Better breathing.  Fewer sick days. Quitting smoking can be hard. Do not give up if you fail the first time. Some people need to try a few times before they succeed. Do your best to stick to your quit plan, and talk with your doctor if you have any questions or concerns. Summary  Smoking tobacco is the leading cause of preventable death. Quitting smoking can be hard, but it is one of the best things that you can do for your health.  When you decide to quit smoking, make a plan to help you succeed.  Quit smoking right away, not slowly over a period of time.  When you start quitting, seek help from your doctor, family, or friends. This information is not intended to replace advice given to you by your health care provider. Make sure you discuss any questions you have with your health care provider. Document Revised: 05/28/2019 Document Reviewed: 11/21/2018 Elsevier Patient Education  2021 [employer]  
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
## 2020-11-22 ENCOUNTER — Telehealth: Payer: Self-pay

## 2020-11-22 NOTE — Telephone Encounter (Signed)
[facility] called saying [facility] [place] [facility]one-Acetaminophen 5/325mg , its on back order. Stated pharmacist told her that you would need to do another script so she could get it filled at another pharmacy, unless you want to prescribe something else for them to fill for her.  Please advise

## 2020-11-22 NOTE — Telephone Encounter (Signed)
Nice but I do not have name of alternate pharmacy to send in medication.

## 2020-11-23 DIAGNOSIS — F17200 Nicotine dependence, unspecified, uncomplicated: Secondary | ICD-10-CM | POA: Diagnosis not present

## 2020-11-23 DIAGNOSIS — I10 Essential (primary) hypertension: Secondary | ICD-10-CM | POA: Diagnosis not present

## 2020-11-23 MED ORDER — HYDROCODONE-ACETAMINOPHEN 5-325 MG PO TABS: ORAL_TABLET | ORAL | 0 refills | Status: DC

## 2020-12-25 ENCOUNTER — Other Ambulatory Visit (INDEPENDENT_AMBULATORY_CARE_PROVIDER_SITE_OTHER): Payer: Self-pay | Admitting: *Deleted

## 2020-12-25 ENCOUNTER — Telehealth (INDEPENDENT_AMBULATORY_CARE_PROVIDER_SITE_OTHER): Payer: Self-pay | Admitting: *Deleted

## 2020-12-25 MED ORDER — PEG 3350-KCL-NA BICARB-NACL 420 G PO SOLR: 4000.0000 mL | Freq: Once | ORAL | 0 refills | Status: AC

## 2020-12-25 NOTE — Telephone Encounter (Signed)
Patient needs trilyte 

## 2020-12-25 NOTE — Telephone Encounter (Signed)
Done

## 2020-12-26 ENCOUNTER — Encounter (INDEPENDENT_AMBULATORY_CARE_PROVIDER_SITE_OTHER): Payer: Self-pay | Admitting: *Deleted

## 2020-12-26 ENCOUNTER — Telehealth (INDEPENDENT_AMBULATORY_CARE_PROVIDER_SITE_OTHER): Payer: Self-pay | Admitting: *Deleted

## 2020-12-26 NOTE — Telephone Encounter (Signed)
Referring MD/PCP: [place]   Procedure: tcs w propofol  Reason/Indication:  screening  Has patient had this procedure before?  no  If so, when, by whom and where?    Is there a family history of colon cancer?  no  Who?  What age when diagnosed?    Is patient diabetic?   no      Does patient have prosthetic heart valve or mechanical valve?  no  Do you have a pacemaker/defibrillator?  no  Has patient ever had endocarditis/atrial fibrillation? no  Have you had a stroke/heart attack last 6 mths? no  Does patient use oxygen? no  Has patient had joint replacement within last 12 months?  no  Is patient constipated or do they take laxatives? no  Does patient have a history of alcohol/drug use?  no  Is patient on blood thinner such as Coumadin, Plavix and/or Aspirin? yes  Do you take medicine for weight loss. no  Medications: asa 325 mg daily, amlodipine 10 mg daily, proair, montelukast 10 mg daily, klor con 10 meq daily, tizanidine 2 mg bid, pantoprazole 40 mg daily, trelegy 100 mcg 1 puff daily, gabapentin 300 mg tid, hctz 25 mg daily,   Allergies: sulfur, codeine  Medication Adjustment per Dr Rehman/Dr Jenetta Downer asa 2 days  Procedure date & time: 01/17/21

## 2020-12-27 ENCOUNTER — Telehealth: Payer: Self-pay | Admitting: Orthopaedic Surgery

## 2020-12-27 ENCOUNTER — Encounter: Payer: Self-pay | Admitting: Orthopaedic Surgery

## 2020-12-27 ENCOUNTER — Other Ambulatory Visit (INDEPENDENT_AMBULATORY_CARE_PROVIDER_SITE_OTHER): Payer: Self-pay

## 2020-12-27 ENCOUNTER — Ambulatory Visit: Payer: Medicare Other | Admitting: Orthopaedic Surgery

## 2020-12-27 ENCOUNTER — Other Ambulatory Visit: Payer: Self-pay

## 2020-12-27 DIAGNOSIS — Z1211 Encounter for screening for malignant neoplasm of colon: Secondary | ICD-10-CM

## 2020-12-27 NOTE — Telephone Encounter (Signed)
Rx refill request

## 2021-01-05 DIAGNOSIS — F17210 Nicotine dependence, cigarettes, uncomplicated: Secondary | ICD-10-CM | POA: Diagnosis not present

## 2021-01-05 DIAGNOSIS — Z79899 Other long term (current) drug therapy: Secondary | ICD-10-CM | POA: Diagnosis not present

## 2021-01-05 DIAGNOSIS — Z882 Allergy status to sulfonamides status: Secondary | ICD-10-CM | POA: Diagnosis not present

## 2021-01-05 DIAGNOSIS — M7989 Other specified soft tissue disorders: Secondary | ICD-10-CM | POA: Diagnosis not present

## 2021-01-05 DIAGNOSIS — M79671 Pain in right foot: Secondary | ICD-10-CM | POA: Diagnosis not present

## 2021-01-05 DIAGNOSIS — S93401A Sprain of unspecified ligament of right ankle, initial encounter: Secondary | ICD-10-CM | POA: Diagnosis not present

## 2021-01-05 DIAGNOSIS — J449 Chronic obstructive pulmonary disease, unspecified: Secondary | ICD-10-CM | POA: Diagnosis not present

## 2021-01-05 DIAGNOSIS — M25571 Pain in right ankle and joints of right foot: Secondary | ICD-10-CM | POA: Diagnosis not present

## 2021-01-05 DIAGNOSIS — I10 Essential (primary) hypertension: Secondary | ICD-10-CM | POA: Diagnosis not present

## 2021-01-05 DIAGNOSIS — Z885 Allergy status to narcotic agent status: Secondary | ICD-10-CM | POA: Diagnosis not present

## 2021-01-05 DIAGNOSIS — X58XXXA Exposure to other specified factors, initial encounter: Secondary | ICD-10-CM | POA: Diagnosis not present

## 2021-01-09 DIAGNOSIS — M109 Gout, unspecified: Secondary | ICD-10-CM | POA: Diagnosis not present

## 2021-01-09 DIAGNOSIS — M1990 Unspecified osteoarthritis, unspecified site: Secondary | ICD-10-CM | POA: Diagnosis not present

## 2021-01-11 NOTE — Patient Instructions (Signed)
Sarah Ochoa  01/11/2021     @PREFPERIOPPHARMACY @   Your procedure is scheduled on  01/17/2021.   Report to [facility] at  [email or web address].   Call this number if you have problems the morning of surgery:  [phone]   Remember:  Follow the diet and prep instructions given to you by the office.                       Take these medicines the morning of surgery with A SIP OF WATER  Amlodipine, hydrocodone (if needed), mobic (if needed), singulair, protonix, zoloft.   Use your nebulizer and your inhalers before you come and bring your rescue inhaler with you.     Please brush your teeth.  Do not wear jewelry, make-up or nail polish.  Do not wear lotions, powders, or perfumes, or deodorant.  Do not shave 48 hours prior to surgery.  Men may shave face and neck.  Do not bring valuables to the hospital.  [facility] is not responsible for any belongings or valuables.  Contacts, dentures or bridgework may not be worn into surgery.  Leave your suitcase in the car.  After surgery it may be brought to your room.  For patients admitted to the hospital, discharge time will be determined by your treatment team.  Patients discharged the day of surgery will not be allowed to drive home and must have someone with them for 24 hours.   Special instructions:  DO NOT smoke tobacco or vape for 24 hours before your procedure.  Please read over the following fact sheets that you were given. Anesthesia Post-op Instructions and Care and Recovery After Surgery       Colonoscopy, Adult, Care After This sheet gives you information about how to care for yourself after your procedure. Your health care provider may also give you more specific instructions. If you have problems or questions, contact your health care provider. What can I expect after the procedure? After the procedure, it is common to have:  A small amount of blood in your stool for 24 hours after the procedure.  Some  gas.  Mild cramping or bloating of your abdomen. Follow these instructions at home: Eating and drinking  Drink enough fluid to keep your urine pale yellow.  Follow instructions from your health care provider about eating or drinking restrictions.  Resume your normal diet as instructed by your health care provider. Avoid heavy or fried foods that are hard to digest.   Activity  Rest as told by your health care provider.  Avoid sitting for a long time without moving. Get up to take short walks every 1-2 hours. This is important to improve blood flow and breathing. Ask for help if you feel weak or unsteady.  Return to your normal activities as told by your health care provider. Ask your health care provider what activities are safe for you. Managing cramping and bloating  Try walking around when you have cramps or feel bloated.  Apply heat to your abdomen as told by your health care provider. Use the heat source that your health care provider recommends, such as a moist heat pack or a heating pad. ? Place a towel between your skin and the heat source. ? Leave the heat on for 20-30 minutes. ? Remove the heat if your skin turns bright red. This is especially important if you are unable to feel pain, heat, or  cold. You may have a greater risk of getting burned.   General instructions  If you were given a sedative during the procedure, it can affect you for several hours. Do not drive or operate machinery until your health care provider says that it is safe.  For the first 24 hours after the procedure: ? Do not sign important documents. ? Do not drink alcohol. ? Do your regular daily activities at a slower pace than normal. ? Eat soft foods that are easy to digest.  Take over-the-counter and prescription medicines only as told by your health care provider.  Keep all follow-up visits as told by your health care provider. This is important. Contact a health care provider if:  You have  blood in your stool 2-3 days after the procedure. Get help right away if you have:  More than a small spotting of blood in your stool.  Large blood clots in your stool.  Swelling of your abdomen.  Nausea or vomiting.  A fever.  Increasing pain in your abdomen that is not relieved with medicine. Summary  After the procedure, it is common to have a small amount of blood in your stool. You may also have mild cramping and bloating of your abdomen.  If you were given a sedative during the procedure, it can affect you for several hours. Do not drive or operate machinery until your health care provider says that it is safe.  Get help right away if you have a lot of blood in your stool, nausea or vomiting, a fever, or increased pain in your abdomen. This information is not intended to replace advice given to you by your health care provider. Make sure you discuss any questions you have with your health care provider. Document Revised: 08/27/2019 Document Reviewed: 03/29/2019 Elsevier Patient Education  2021 [place] After This sheet gives you information about how to care for yourself after your procedure. Your health care provider may also give you more specific instructions. If you have problems or questions, contact your health care provider. What can I expect after the procedure? After the procedure, it is common to have:  Tiredness.  Forgetfulness about what happened after the procedure.  Impaired judgment for important decisions.  Nausea or vomiting.  Some difficulty with balance. Follow these instructions at home: For the time period you were told by your health care provider:  Rest as needed.  Do not participate in activities where you could fall or become injured.  Do not drive or use machinery.  Do not drink alcohol.  Do not take sleeping pills or medicines that cause drowsiness.  Do not make important decisions or sign legal  documents.  Do not take care of children on your own.      Eating and drinking  Follow the diet that is recommended by your health care provider.  Drink enough fluid to keep your urine pale yellow.  If you vomit: ? Drink water, juice, or soup when you can drink without vomiting. ? Make sure you have little or no nausea before eating solid foods. General instructions  Have a responsible adult stay with you for the time you are told. It is important to have someone help care for you until you are awake and alert.  Take over-the-counter and prescription medicines only as told by your health care provider.  If you have sleep apnea, surgery and certain medicines can increase your risk for breathing problems. Follow instructions from your  health care provider about wearing your sleep device: ? Anytime you are sleeping, including during daytime naps. ? While taking prescription pain medicines, sleeping medicines, or medicines that make you drowsy.  Avoid smoking.  Keep all follow-up visits as told by your health care provider. This is important. Contact a health care provider if:  You keep feeling nauseous or you keep vomiting.  You feel light-headed.  You are still sleepy or having trouble with balance after 24 hours.  You develop a rash.  You have a fever.  You have redness or swelling around the IV site. Get help right away if:  You have trouble breathing.  You have new-onset confusion at home. Summary  For several hours after your procedure, you may feel tired. You may also be forgetful and have poor judgment.  Have a responsible adult stay with you for the time you are told. It is important to have someone help care for you until you are awake and alert.  Rest as told. Do not drive or operate machinery. Do not drink alcohol or take sleeping pills.  Get help right away if you have trouble breathing, or if you suddenly become confused. This information is not  intended to replace advice given to you by your health care provider. Make sure you discuss any questions you have with your health care provider. Document Revised: 05/18/2020 Document Reviewed: 08/05/2019 Elsevier Patient Education  2021 [employer].

## 2021-01-15 ENCOUNTER — Encounter (HOSPITAL_COMMUNITY)
Admission: RE | Admit: 2021-01-15 | Discharge: 2021-01-15 | Disposition: A | Discharge status: Home / Self Care | Payer: Medicare Other | Source: Ambulatory Visit | Attending: Internal Medicine | Admitting: Internal Medicine

## 2021-01-15 ENCOUNTER — Other Ambulatory Visit (HOSPITAL_COMMUNITY): Payer: Medicare Other

## 2021-01-15 ENCOUNTER — Encounter (HOSPITAL_COMMUNITY): Payer: Self-pay

## 2021-01-16 ENCOUNTER — Other Ambulatory Visit: Payer: Self-pay

## 2021-01-16 ENCOUNTER — Ambulatory Visit (INDEPENDENT_AMBULATORY_CARE_PROVIDER_SITE_OTHER): Payer: Medicare Other | Admitting: Adult Health

## 2021-01-16 ENCOUNTER — Encounter: Payer: Self-pay | Admitting: Adult Health

## 2021-01-16 VITALS — BP 150/92 | HR 74 | Ht 65.0 in | Wt 121.0 lb

## 2021-01-16 DIAGNOSIS — Z79899 Other long term (current) drug therapy: Secondary | ICD-10-CM | POA: Diagnosis not present

## 2021-01-16 DIAGNOSIS — Z90710 Acquired absence of both cervix and uterus: Secondary | ICD-10-CM | POA: Diagnosis not present

## 2021-01-16 DIAGNOSIS — Z01419 Encounter for gynecological examination (general) (routine) without abnormal findings: Secondary | ICD-10-CM | POA: Insufficient documentation

## 2021-01-16 DIAGNOSIS — R232 Flushing: Secondary | ICD-10-CM

## 2021-01-16 DIAGNOSIS — Z1211 Encounter for screening for malignant neoplasm of colon: Secondary | ICD-10-CM | POA: Diagnosis not present

## 2021-01-16 LAB — HEMOCCULT GUIAC POC 1CARD (OFFICE): Fecal Occult Blood, POC: NEGATIVE

## 2021-01-16 MED ORDER — ESTRADIOL 2 MG PO TABS: 2.0000 mg | ORAL_TABLET | Freq: Every day | ORAL | 12 refills | Status: DC

## 2021-01-16 NOTE — Progress Notes (Signed)
Patient ID: Sarah Ochoa, female   DOB: [birth year], 60 y.o.   MRN: [id number] History of Present Illness: Sarah Ochoa is a 60 year old black female, divorced, but engaged, sp hysterectomy in for well woman gyn exam. She was on Estrace 1 mg for hot flashes and ran out and they are bad, sweats at night and wakes up often. PCP is Dr Legrand Rams.   Current Medications, Allergies, Past Medical History, Past Surgical History, Family History and Social History were reviewed in [employer] record.     Review of Systems: Patient denies any headaches, hearing loss, fatigue, blurred vision, shortness of breath, chest pain, abdominal pain, problems with bowel movements, urination, or intercourse. No joint pain or mood swings. See HPI for positives     Physical Exam:BP (!) 162/93 (BP Location: Left Arm, Patient Position: Sitting, Cuff Size: Normal)   Pulse 74   Ht 5\' 5"  (1.651 m)   Wt 121 lb (54.9 kg)   BMI 20.14 kg/m  BP recheck 150/92 General:  Well developed, well nourished, no acute distress Skin:  Warm and dry Neck:  Midline trachea, normal thyroid, good ROM, no lymphadenopathy Lungs; Clear to auscultation bilaterally Breast:  No dominant palpable mass, retraction, or nipple discharge Cardiovascular: Regular rate and rhythm Abdomen:  Soft, non tender, no hepatosplenomegaly Pelvic:  External genitalia is normal in appearance, no lesions.  The vagina is normal in appearance, no lesion at vaginal cuff. Urethra has no lesions or masses. The cervix and uterus are absent.  No adnexal masses or tenderness noted.Bladder is non tender, no masses felt. Rectal: Good sphincter tone, no polyps, or hemorrhoids felt.  Hemoccult negative. Extremities/musculoskeletal:  No swelling or varicosities noted, no clubbing or cyanosis Psych:  No mood changes, alert and cooperative,seems happy AA is 1  Fall risk is low PHQ 9 score is 6 GAD 7 score is 8, is on Zoloft  [facility] - 01/16/21 1497       Pregnancy Intention Screening   Does the patient want to become pregnant in the next year? N/A    Does the patient's partner want to become pregnant in the next year? N/A    Would the patient like to discuss contraceptive options today? N/A      Contraception Wrap Up   Current Method No Method - Other Reason   hysterectomy   End Method No Method - Other Reason   hysterectomy   Contraception Counseling Provided No         Examination chaperoned by Tish RN  Impression and Plan: 1. Normal pelvic exam  2. Hot flashes Will rx estrace 2 mg Meds ordered this encounter  Medications  . estradiol (ESTRACE) 2 MG tablet    Sig: Take 1 tablet (2 mg total) by mouth daily.    Dispense:  30 tablet    Refill:  12    Order Specific Question:   Supervising Provider    Answer:   Elonda Husky, LUTHER H [[id number]]    3. Encounter for screening fecal occult blood testing  4. Encounter for well woman exam with routine gynecological exam Physical in 1 year Labs with PCP  -call to schedule mammogram  Colonoscopy 2024  She declines STI testing   5. S/P hysterectomy  6. Current use estrogen therapy

## 2021-01-17 ENCOUNTER — Encounter (HOSPITAL_COMMUNITY): Payer: Self-pay

## 2021-01-17 ENCOUNTER — Ambulatory Visit (HOSPITAL_COMMUNITY): Admit: 2021-01-17 | Payer: Medicare Other | Admitting: Internal Medicine

## 2021-01-17 SURGERY — COLONOSCOPY WITH PROPOFOL
Anesthesia: Monitor Anesthesia Care

## 2021-01-19 ENCOUNTER — Ambulatory Visit (HOSPITAL_COMMUNITY): Admission: RE | Admit: 2021-01-19 | Payer: Medicare Other | Source: Ambulatory Visit

## 2021-02-08 DIAGNOSIS — M1990 Unspecified osteoarthritis, unspecified site: Secondary | ICD-10-CM | POA: Diagnosis not present

## 2021-02-08 DIAGNOSIS — I10 Essential (primary) hypertension: Secondary | ICD-10-CM | POA: Diagnosis not present

## 2021-02-10 ENCOUNTER — Other Ambulatory Visit: Payer: Self-pay | Admitting: Adult Health

## 2021-02-19 ENCOUNTER — Emergency Department (HOSPITAL_COMMUNITY): Payer: Medicare Other

## 2021-02-19 ENCOUNTER — Other Ambulatory Visit: Payer: Self-pay

## 2021-02-19 ENCOUNTER — Inpatient Hospital Stay (HOSPITAL_COMMUNITY)
Admission: EM | Admit: 2021-02-19 | Discharge: 2021-02-22 | DRG: 390 | Disposition: A | Discharge status: Home / Self Care | Payer: Medicare Other | Attending: Family Medicine | Admitting: Family Medicine

## 2021-02-19 ENCOUNTER — Encounter (HOSPITAL_COMMUNITY): Payer: Self-pay

## 2021-02-19 DIAGNOSIS — Z8249 Family history of ischemic heart disease and other diseases of the circulatory system: Secondary | ICD-10-CM | POA: Diagnosis not present

## 2021-02-19 DIAGNOSIS — Z20822 Contact with and (suspected) exposure to covid-19: Secondary | ICD-10-CM | POA: Diagnosis present

## 2021-02-19 DIAGNOSIS — Z885 Allergy status to narcotic agent status: Secondary | ICD-10-CM

## 2021-02-19 DIAGNOSIS — F1010 Alcohol abuse, uncomplicated: Secondary | ICD-10-CM

## 2021-02-19 DIAGNOSIS — I10 Essential (primary) hypertension: Secondary | ICD-10-CM | POA: Diagnosis present

## 2021-02-19 DIAGNOSIS — Z96643 Presence of artificial hip joint, bilateral: Secondary | ICD-10-CM | POA: Diagnosis present

## 2021-02-19 DIAGNOSIS — J449 Chronic obstructive pulmonary disease, unspecified: Secondary | ICD-10-CM | POA: Diagnosis not present

## 2021-02-19 DIAGNOSIS — F1410 Cocaine abuse, uncomplicated: Secondary | ICD-10-CM | POA: Diagnosis not present

## 2021-02-19 DIAGNOSIS — K56609 Unspecified intestinal obstruction, unspecified as to partial versus complete obstruction: Principal | ICD-10-CM

## 2021-02-19 DIAGNOSIS — Z882 Allergy status to sulfonamides status: Secondary | ICD-10-CM

## 2021-02-19 DIAGNOSIS — Z791 Long term (current) use of non-steroidal anti-inflammatories (NSAID): Secondary | ICD-10-CM

## 2021-02-19 DIAGNOSIS — F17210 Nicotine dependence, cigarettes, uncomplicated: Secondary | ICD-10-CM | POA: Diagnosis present

## 2021-02-19 DIAGNOSIS — Z79899 Other long term (current) drug therapy: Secondary | ICD-10-CM

## 2021-02-19 DIAGNOSIS — Z833 Family history of diabetes mellitus: Secondary | ICD-10-CM | POA: Diagnosis not present

## 2021-02-19 DIAGNOSIS — Z7951 Long term (current) use of inhaled steroids: Secondary | ICD-10-CM | POA: Diagnosis not present

## 2021-02-19 DIAGNOSIS — Z0189 Encounter for other specified special examinations: Secondary | ICD-10-CM

## 2021-02-19 DIAGNOSIS — R1012 Left upper quadrant pain: Secondary | ICD-10-CM | POA: Diagnosis not present

## 2021-02-19 DIAGNOSIS — I700 Atherosclerosis of aorta: Secondary | ICD-10-CM | POA: Diagnosis not present

## 2021-02-19 DIAGNOSIS — Z809 Family history of malignant neoplasm, unspecified: Secondary | ICD-10-CM

## 2021-02-19 DIAGNOSIS — R112 Nausea with vomiting, unspecified: Secondary | ICD-10-CM | POA: Diagnosis not present

## 2021-02-19 DIAGNOSIS — N3281 Overactive bladder: Secondary | ICD-10-CM | POA: Diagnosis not present

## 2021-02-19 DIAGNOSIS — R001 Bradycardia, unspecified: Secondary | ICD-10-CM | POA: Diagnosis not present

## 2021-02-19 DIAGNOSIS — F419 Anxiety disorder, unspecified: Secondary | ICD-10-CM | POA: Diagnosis present

## 2021-02-19 DIAGNOSIS — E785 Hyperlipidemia, unspecified: Secondary | ICD-10-CM | POA: Diagnosis present

## 2021-02-19 DIAGNOSIS — F32A Depression, unspecified: Secondary | ICD-10-CM | POA: Diagnosis present

## 2021-02-19 DIAGNOSIS — K2920 Alcoholic gastritis without bleeding: Secondary | ICD-10-CM | POA: Diagnosis present

## 2021-02-19 DIAGNOSIS — R109 Unspecified abdominal pain: Secondary | ICD-10-CM | POA: Diagnosis not present

## 2021-02-19 DIAGNOSIS — K6389 Other specified diseases of intestine: Secondary | ICD-10-CM | POA: Diagnosis not present

## 2021-02-19 LAB — COMPREHENSIVE METABOLIC PANEL
ALT: 17 U/L (ref 0–44)
AST: 26 U/L (ref 15–41)
Albumin: 3.9 g/dL (ref 3.5–5.0)
Alkaline Phosphatase: 68 U/L (ref 38–126)
Anion gap: 8 (ref 5–15)
BUN: 15 mg/dL (ref 6–20)
CO2: 21 mmol/L — ABNORMAL LOW (ref 22–32)
Calcium: 8.6 mg/dL — ABNORMAL LOW (ref 8.9–10.3)
Chloride: 104 mmol/L (ref 98–111)
Creatinine, Ser: 0.72 mg/dL (ref 0.44–1.00)
GFR, Estimated: >60 mL/min (ref >60)
Glucose, Bld: 93 mg/dL (ref 70–99)
Potassium: 3.6 mmol/L (ref 3.5–5.1)
Sodium: 133 mmol/L — ABNORMAL LOW (ref 135–145)
Total Bilirubin: 0.7 mg/dL (ref 0.3–1.2)
Total Protein: 7.7 g/dL (ref 6.5–8.1)

## 2021-02-19 LAB — URINALYSIS, ROUTINE W REFLEX MICROSCOPIC
Bacteria, UA: NONE SEEN
Bilirubin Urine: NEGATIVE
Glucose, UA: NEGATIVE mg/dL
Ketones, ur: 5 mg/dL — AB
Leukocytes,Ua: NEGATIVE
Nitrite: NEGATIVE
Protein, ur: NEGATIVE mg/dL
Specific Gravity, Urine: 1.005 (ref 1.005–1.030)
pH: 6 (ref 5.0–8.0)

## 2021-02-19 LAB — CBC
HCT: 39.8 % (ref 36.0–46.0)
Hemoglobin: 12.9 g/dL (ref 12.0–15.0)
MCH: 29.9 pg (ref 26.0–34.0)
MCHC: 32.4 g/dL (ref 30.0–36.0)
MCV: 92.3 fL (ref 80.0–100.0)
Platelets: 290 10*3/uL (ref 150–400)
RBC: 4.31 MIL/uL (ref 3.87–5.11)
RDW: 16.7 % — ABNORMAL HIGH (ref 11.5–15.5)
WBC: 9 10*3/uL (ref 4.0–10.5)
nRBC: 0 % (ref 0.0–0.2)

## 2021-02-19 LAB — PHOSPHORUS: Phosphorus: 1.9 mg/dL — ABNORMAL LOW (ref 2.5–4.6)

## 2021-02-19 LAB — RESP PANEL BY RT-PCR (FLU A&B, COVID) ARPGX2
Influenza A by PCR: NEGATIVE
Influenza B by PCR: NEGATIVE
SARS Coronavirus 2 by RT PCR: NEGATIVE

## 2021-02-19 LAB — RAPID URINE DRUG SCREEN, HOSP PERFORMED
Amphetamines: NOT DETECTED
Barbiturates: NOT DETECTED
Benzodiazepines: NOT DETECTED
Cocaine: POSITIVE — AB
Opiates: NOT DETECTED
Tetrahydrocannabinol: POSITIVE — AB

## 2021-02-19 LAB — MAGNESIUM: Magnesium: 2.1 mg/dL (ref 1.7–2.4)

## 2021-02-19 LAB — LIPASE, BLOOD: Lipase: 39 U/L (ref 11–51)

## 2021-02-19 LAB — ETHANOL: Alcohol, Ethyl (B): <10 mg/dL (ref <10)

## 2021-02-19 MED ORDER — LORAZEPAM 1 MG PO TABS: 1.0000 mg | ORAL_TABLET | ORAL | Status: DC | PRN

## 2021-02-19 MED ORDER — ACETAMINOPHEN 325 MG PO TABS: 650.0000 mg | ORAL_TABLET | Freq: Four times a day (QID) | ORAL | Status: DC | PRN

## 2021-02-19 MED ORDER — ACETAMINOPHEN 650 MG RE SUPP: 650.0000 mg | Freq: Four times a day (QID) | RECTAL | Status: DC | PRN

## 2021-02-19 MED ORDER — HYDRALAZINE HCL 20 MG/ML IJ SOLN
10.0000 mg | INTRAMUSCULAR | Status: DC | PRN
  Administered 2021-02-19: 10 mg via INTRAVENOUS
  Filled 2021-02-19: qty 1

## 2021-02-19 MED ORDER — ADULT MULTIVITAMIN W/MINERALS CH
1.0000 | ORAL_TABLET | Freq: Every day | ORAL | Status: DC
  Administered 2021-02-21 – 2021-02-22 (×2): 1 via ORAL
  Filled 2021-02-19 (×2): qty 1

## 2021-02-19 MED ORDER — LORAZEPAM 2 MG/ML IJ SOLN: 1.0000 mg | INTRAMUSCULAR | Status: DC | PRN

## 2021-02-19 MED ORDER — FLUTICASONE-UMECLIDIN-VILANT 100-62.5-25 MCG/INH IN AEPB: 1.0000 | INHALATION_SPRAY | Freq: Every day | RESPIRATORY_TRACT | Status: DC | PRN

## 2021-02-19 MED ORDER — ENOXAPARIN SODIUM 40 MG/0.4ML IJ SOSY
40.0000 mg | PREFILLED_SYRINGE | INTRAMUSCULAR | Status: DC
  Administered 2021-02-19 – 2021-02-21 (×3): 40 mg via SUBCUTANEOUS
  Filled 2021-02-19 (×3): qty 0.4

## 2021-02-19 MED ORDER — FENTANYL CITRATE (PF) 100 MCG/2ML IJ SOLN
50.0000 ug | Freq: Once | INTRAMUSCULAR | Status: AC
  Administered 2021-02-19: 50 ug via INTRAVENOUS
  Filled 2021-02-19: qty 2

## 2021-02-19 MED ORDER — THIAMINE HCL 100 MG PO TABS
100.0000 mg | ORAL_TABLET | Freq: Every day | ORAL | Status: DC
  Administered 2021-02-21 – 2021-02-22 (×2): 100 mg via ORAL
  Filled 2021-02-19 (×2): qty 1

## 2021-02-19 MED ORDER — ONDANSETRON HCL 4 MG/2ML IJ SOLN
4.0000 mg | Freq: Four times a day (QID) | INTRAMUSCULAR | Status: DC | PRN
  Administered 2021-02-20 (×2): 4 mg via INTRAVENOUS
  Filled 2021-02-19 (×2): qty 2

## 2021-02-19 MED ORDER — THIAMINE HCL 100 MG/ML IJ SOLN
100.0000 mg | Freq: Every day | INTRAMUSCULAR | Status: DC
  Administered 2021-02-19 – 2021-02-20 (×2): 100 mg via INTRAVENOUS
  Filled 2021-02-19 (×2): qty 2

## 2021-02-19 MED ORDER — FOLIC ACID 1 MG PO TABS
1.0000 mg | ORAL_TABLET | Freq: Every day | ORAL | Status: DC
  Administered 2021-02-21 – 2021-02-22 (×2): 1 mg via ORAL
  Filled 2021-02-19 (×2): qty 1

## 2021-02-19 MED ORDER — ONDANSETRON HCL 4 MG PO TABS
4.0000 mg | ORAL_TABLET | Freq: Four times a day (QID) | ORAL | Status: DC | PRN
  Administered 2021-02-21 (×2): 4 mg via ORAL
  Filled 2021-02-19 (×3): qty 1

## 2021-02-19 MED ORDER — SODIUM CHLORIDE 0.9 % IV BOLUS
1000.0000 mL | Freq: Once | INTRAVENOUS | Status: AC
  Administered 2021-02-19: 1000 mL via INTRAVENOUS

## 2021-02-19 MED ORDER — MORPHINE SULFATE (PF) 2 MG/ML IV SOLN
4.0000 mg | Freq: Once | INTRAVENOUS | Status: AC
Start: 2021-02-19 — End: 2021-02-19
  Administered 2021-02-19: 4 mg via INTRAVENOUS
  Filled 2021-02-19: qty 2

## 2021-02-19 MED ORDER — KCL IN DEXTROSE-NACL 20-5-0.9 MEQ/L-%-% IV SOLN: INTRAVENOUS | Status: DC

## 2021-02-19 MED ORDER — MORPHINE SULFATE (PF) 2 MG/ML IV SOLN
4.0000 mg | INTRAVENOUS | Status: DC | PRN
Start: 2021-02-19 — End: 2021-02-21
  Administered 2021-02-19 – 2021-02-21 (×7): 4 mg via INTRAVENOUS
  Filled 2021-02-19 (×7): qty 2

## 2021-02-19 MED ORDER — ONDANSETRON HCL 4 MG/2ML IJ SOLN
4.0000 mg | Freq: Once | INTRAMUSCULAR | Status: AC
  Administered 2021-02-19: 4 mg via INTRAVENOUS
  Filled 2021-02-19: qty 2

## 2021-02-19 MED ORDER — ALBUTEROL SULFATE (2.5 MG/3ML) 0.083% IN NEBU: 2.5000 mg | INHALATION_SOLUTION | RESPIRATORY_TRACT | Status: DC | PRN

## 2021-02-19 MED ORDER — PANTOPRAZOLE SODIUM 40 MG IV SOLR
40.0000 mg | INTRAVENOUS | Status: DC
  Administered 2021-02-19 – 2021-02-20 (×2): 40 mg via INTRAVENOUS
  Filled 2021-02-19 (×2): qty 40

## 2021-02-19 NOTE — Progress Notes (Signed)
Patient unable to tolerate NG placement. Patient stated her last bowel movement this morning 02/19/2021, has mild distention to ABD. Bowel sounds to Left Quadrants of ABD. MD notified. Dr. Denton Brick stated it is ok to hold off on NG tube at this time.

## 2021-02-19 NOTE — ED Triage Notes (Signed)
Pt c/o left sided abd pain x 1 week ago.  Reports n/v.  Denies diarrhea.  Reports urinary frequency.  LBM was this am around 2 am.   Reports history of pancreatitis.
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
## 2021-02-20 ENCOUNTER — Observation Stay (HOSPITAL_COMMUNITY): Payer: Medicare Other

## 2021-02-20 DIAGNOSIS — K56609 Unspecified intestinal obstruction, unspecified as to partial versus complete obstruction: Secondary | ICD-10-CM | POA: Diagnosis present

## 2021-02-20 DIAGNOSIS — Z96643 Presence of artificial hip joint, bilateral: Secondary | ICD-10-CM | POA: Diagnosis present

## 2021-02-20 DIAGNOSIS — Z833 Family history of diabetes mellitus: Secondary | ICD-10-CM | POA: Diagnosis not present

## 2021-02-20 DIAGNOSIS — Z8249 Family history of ischemic heart disease and other diseases of the circulatory system: Secondary | ICD-10-CM | POA: Diagnosis not present

## 2021-02-20 DIAGNOSIS — Z809 Family history of malignant neoplasm, unspecified: Secondary | ICD-10-CM | POA: Diagnosis not present

## 2021-02-20 DIAGNOSIS — R109 Unspecified abdominal pain: Secondary | ICD-10-CM | POA: Diagnosis not present

## 2021-02-20 DIAGNOSIS — K2920 Alcoholic gastritis without bleeding: Secondary | ICD-10-CM | POA: Diagnosis present

## 2021-02-20 DIAGNOSIS — Z79899 Other long term (current) drug therapy: Secondary | ICD-10-CM | POA: Diagnosis not present

## 2021-02-20 DIAGNOSIS — Z885 Allergy status to narcotic agent status: Secondary | ICD-10-CM | POA: Diagnosis not present

## 2021-02-20 DIAGNOSIS — Z882 Allergy status to sulfonamides status: Secondary | ICD-10-CM | POA: Diagnosis not present

## 2021-02-20 DIAGNOSIS — Z20822 Contact with and (suspected) exposure to covid-19: Secondary | ICD-10-CM | POA: Diagnosis present

## 2021-02-20 DIAGNOSIS — E785 Hyperlipidemia, unspecified: Secondary | ICD-10-CM | POA: Diagnosis present

## 2021-02-20 DIAGNOSIS — F17210 Nicotine dependence, cigarettes, uncomplicated: Secondary | ICD-10-CM | POA: Diagnosis present

## 2021-02-20 DIAGNOSIS — Z791 Long term (current) use of non-steroidal anti-inflammatories (NSAID): Secondary | ICD-10-CM | POA: Diagnosis not present

## 2021-02-20 DIAGNOSIS — N3281 Overactive bladder: Secondary | ICD-10-CM | POA: Diagnosis present

## 2021-02-20 DIAGNOSIS — I10 Essential (primary) hypertension: Secondary | ICD-10-CM | POA: Diagnosis not present

## 2021-02-20 DIAGNOSIS — Z7951 Long term (current) use of inhaled steroids: Secondary | ICD-10-CM | POA: Diagnosis not present

## 2021-02-20 DIAGNOSIS — F419 Anxiety disorder, unspecified: Secondary | ICD-10-CM | POA: Diagnosis present

## 2021-02-20 DIAGNOSIS — F1010 Alcohol abuse, uncomplicated: Secondary | ICD-10-CM | POA: Diagnosis present

## 2021-02-20 DIAGNOSIS — F32A Depression, unspecified: Secondary | ICD-10-CM | POA: Diagnosis not present

## 2021-02-20 DIAGNOSIS — F1410 Cocaine abuse, uncomplicated: Secondary | ICD-10-CM | POA: Diagnosis present

## 2021-02-20 DIAGNOSIS — J449 Chronic obstructive pulmonary disease, unspecified: Secondary | ICD-10-CM | POA: Diagnosis not present

## 2021-02-20 LAB — LIPASE, BLOOD: Lipase: 37 U/L (ref 11–51)

## 2021-02-20 LAB — BASIC METABOLIC PANEL
Anion gap: 6 (ref 5–15)
BUN: 7 mg/dL (ref 6–20)
CO2: 23 mmol/L (ref 22–32)
Calcium: 8.4 mg/dL — ABNORMAL LOW (ref 8.9–10.3)
Chloride: 110 mmol/L (ref 98–111)
Creatinine, Ser: 0.59 mg/dL (ref 0.44–1.00)
GFR, Estimated: >60 mL/min (ref >60)
Glucose, Bld: 94 mg/dL (ref 70–99)
Potassium: 3.3 mmol/L — ABNORMAL LOW (ref 3.5–5.1)
Sodium: 139 mmol/L (ref 135–145)

## 2021-02-20 LAB — CBC WITH DIFFERENTIAL/PLATELET
Abs Immature Granulocytes: 0.01 10*3/uL (ref 0.00–0.07)
Basophils Absolute: 0.1 10*3/uL (ref 0.0–0.1)
Basophils Relative: 1 %
Eosinophils Absolute: 0.3 10*3/uL (ref 0.0–0.5)
Eosinophils Relative: 3 %
HCT: 36.3 % (ref 36.0–46.0)
Hemoglobin: 11.9 g/dL — ABNORMAL LOW (ref 12.0–15.0)
Immature Granulocytes: 0 %
Lymphocytes Relative: 39 %
Lymphs Abs: 3.3 10*3/uL (ref 0.7–4.0)
MCH: 29.9 pg (ref 26.0–34.0)
MCHC: 32.8 g/dL (ref 30.0–36.0)
MCV: 91.2 fL (ref 80.0–100.0)
Monocytes Absolute: 1 10*3/uL (ref 0.1–1.0)
Monocytes Relative: 12 %
Neutro Abs: 3.8 10*3/uL (ref 1.7–7.7)
Neutrophils Relative %: 45 %
Platelets: 301 10*3/uL (ref 150–400)
RBC: 3.98 MIL/uL (ref 3.87–5.11)
RDW: 16.4 % — ABNORMAL HIGH (ref 11.5–15.5)
WBC: 8.5 10*3/uL (ref 4.0–10.5)
nRBC: 0 % (ref 0.0–0.2)

## 2021-02-20 LAB — CBC
HCT: 35.5 % — ABNORMAL LOW (ref 36.0–46.0)
Hemoglobin: 11.8 g/dL — ABNORMAL LOW (ref 12.0–15.0)
MCH: 30.3 pg (ref 26.0–34.0)
MCHC: 33.2 g/dL (ref 30.0–36.0)
MCV: 91 fL (ref 80.0–100.0)
Platelets: 283 10*3/uL (ref 150–400)
RBC: 3.9 MIL/uL (ref 3.87–5.11)
RDW: 16.3 % — ABNORMAL HIGH (ref 11.5–15.5)
WBC: 8.5 10*3/uL (ref 4.0–10.5)
nRBC: 0 % (ref 0.0–0.2)

## 2021-02-20 LAB — COMPREHENSIVE METABOLIC PANEL
ALT: 15 U/L (ref 0–44)
AST: 25 U/L (ref 15–41)
Albumin: 3.2 g/dL — ABNORMAL LOW (ref 3.5–5.0)
Alkaline Phosphatase: 56 U/L (ref 38–126)
Anion gap: 5 (ref 5–15)
BUN: 8 mg/dL (ref 6–20)
CO2: 24 mmol/L (ref 22–32)
Calcium: 8.4 mg/dL — ABNORMAL LOW (ref 8.9–10.3)
Chloride: 110 mmol/L (ref 98–111)
Creatinine, Ser: 0.64 mg/dL (ref 0.44–1.00)
GFR, Estimated: >60 mL/min (ref >60)
Glucose, Bld: 92 mg/dL (ref 70–99)
Potassium: 3.3 mmol/L — ABNORMAL LOW (ref 3.5–5.1)
Sodium: 139 mmol/L (ref 135–145)
Total Bilirubin: 0.6 mg/dL (ref 0.3–1.2)
Total Protein: 6.3 g/dL — ABNORMAL LOW (ref 6.5–8.1)

## 2021-02-20 LAB — HIV ANTIBODY (ROUTINE TESTING W REFLEX): HIV Screen 4th Generation wRfx: NONREACTIVE

## 2021-02-20 LAB — GLUCOSE, CAPILLARY: Glucose-Capillary: 130 mg/dL — ABNORMAL HIGH (ref 70–99)

## 2021-02-20 MED ORDER — HYDRALAZINE HCL 20 MG/ML IJ SOLN: 5.0000 mg | INTRAMUSCULAR | Status: DC | PRN

## 2021-02-20 MED ORDER — DIATRIZOATE MEGLUMINE & SODIUM 66-10 % PO SOLN
90.0000 mL | Freq: Once | ORAL | Status: AC
  Administered 2021-02-20: 90 mL via NASOGASTRIC
  Filled 2021-02-20 (×3): qty 90

## 2021-02-20 MED ORDER — DIATRIZOATE MEGLUMINE & SODIUM 66-10 % PO SOLN
ORAL | Status: AC
  Filled 2021-02-20: qty 90

## 2021-02-20 NOTE — Progress Notes (Signed)
PROGRESS NOTE    Sarah Ochoa  ZHY:[id number] DOB: 08-07-1961 DOA: 02/19/2021 PCP: Avon Gully, MD   Chief Complaint  Patient presents with  . Abdominal Pain    Brief Narrative:  Sarah Ochoa is Sarah Ochoa 60 y.o. 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 She presented to the ED with complaints of generalized abdominal pain of 1 week duration that is worsening in severity.  She endorses abdominal bloating, she also reports 3-4 episodes of vomiting over the past week.  Last bowel movement was yesterday, she reports it was hard and small.  She reports urinary frequency and urgency over the past week also.  But denies pain with urination. She has had abdominal surgeries in the past-abdominal hysterectomy.  ED Course: Blood pressure systolic 170s to 295M, lipase normal 39.  UA -small ketones, blood alcohol level less than 10.  Abdominal CT shows distention of some small bowel loops in the pelvis with air-fluid levels, may reflect Sarah Ochoa very low-grade obstruction.  Assessment & Plan:   Principal Problem:   Bowel obstruction (HCC) Active Problems:   COPD (chronic obstructive pulmonary disease) (HCC)   HTN (hypertension)   Alcohol abuse   Cocaine abuse (HCC)   Anxiety and depression  Small Bowel obstruction  Left Upper Quadrant Pain -CT shows minor distention of small bowel loops in the pelvis with air-fluid levels, very low-grade obstruction.  With vomiting and abdominal bloating.  History of abdominal hysterectomy. - KUB 6/7 with no bowel obstruction or free air - follow small bowel protocol  - LUQ discomfort on exam - CT without concerning findings, normal lipase - with hx etoh abuse, ? Gastritis related to etoh use -> continue PPI - Continue IVF - zofran, PPI - consider surgery c/s as needed  HTN-blood pressure elevated up to 180s. -While NPO, hold HCTZ 25,  Norvasc 10 -IV hydralazine prn   Urinary frequency and urgency- ? overactive bladder.  UA unremarkable.  Asthma, COPD-stable. -Albuterol nebulizers as needed, resume home bronchodilator regimen  Alcohol abuse, substance use-blood alcohol level less than 10.  She reports her last alcoholic beverage was on Saturday 2 days ago, she reports she took 3 shots of vodka.  She denies daily alcohol intake. -CIWA as needed -Thiamine folate multivitamin - Check magnesium, phosphorus  - UDS  Anxiety/depression -Hold Zoloft while n.p.o.  DVT prophylaxis: lovenox Code Status: full  Family Communication: none at bedside Disposition:   Status is: Observation  The patient will require care spanning > 2 midnights and should be moved to inpatient because: Inpatient level of care appropriate due to severity of illness  Dispo: The patient is from: Home              Anticipated d/c is to: Home              Patient currently is not medically stable to d/c.   Difficult to place patient No       Consultants:   none  Procedures:   none  Antimicrobials:  Anti-infectives (From admission, onward)   None         Subjective: C/o abdominal discomfort  Objective: Vitals:   02/19/21 2100 02/20/21 0104 02/20/21 0505 02/20/21 1340  BP: (!) 171/88 (!) 138/94 (!) 164/102 130/83  Pulse: (!) 52 77 63 60  Resp: 20 18 18 18   Temp: 98.4 F (36.9 C) 98.5 F (36.9 C) 98.4 F (36.9 C) 97.6  F (36.4 C)  TempSrc: Oral Oral Oral Oral  SpO2: 100% 99% 98% 100%  Weight: 52.7 kg     Height: 5\' 5"  (1.651 m)       Intake/Output Summary (Last 24 hours) at 02/20/2021 1955 Last data filed at 02/20/2021 1300 Gross per 24 hour  Intake 467.77 ml  Output 800 ml  Net -332.23 ml   Filed Weights   02/19/21 1207 02/19/21 1209 02/19/21 2100  Weight: 53.1 kg 53.1 kg 52.7 kg    Examination:  General exam: Appears calm and comfortable  Respiratory system: Clear to auscultation. Respiratory effort  normal. Cardiovascular system: S1 & S2 heard, RRR. Gastrointestinal system: LUQ discomfort with palpation Central nervous system: Alert and oriented. No focal neurological deficits. Extremities: no LEE Skin: No rashes, lesions or ulcers Psychiatry: Judgement and insight appear normal. Mood & affect appropriate.     Data Reviewed: I have personally reviewed following labs and imaging studies  CBC: Recent Labs  Lab 02/19/21 1254 02/20/21 0817  WBC 9.0 8.5  8.5  NEUTROABS  --  3.8  HGB 12.9 11.8*  11.9*  HCT 39.8 35.5*  36.3  MCV 92.3 91.0  91.2  PLT 290 283  301    Basic Metabolic Panel: Recent Labs  Lab 02/19/21 1254 02/19/21 1541 02/20/21 0817  NA 133*  --  139  139  K 3.6  --  3.3*  3.3*  CL 104  --  110  110  CO2 21*  --  24  23  GLUCOSE 93  --  92  94  BUN 15  --  8  7  CREATININE 0.72  --  0.64  0.59  CALCIUM 8.6*  --  8.4*  8.4*  MG  --  2.1  --   PHOS  --  1.9*  --     GFR: Estimated Creatinine Clearance: 63.8 mL/min (by C-G formula based on SCr of 0.59 mg/dL).  Liver Function Tests: Recent Labs  Lab 02/19/21 1254 02/20/21 0817  AST 26 25  ALT 17 15  ALKPHOS 68 56  BILITOT 0.7 0.6  PROT 7.7 6.3*  ALBUMIN 3.9 3.2*    CBG: No results for input(s): GLUCAP in the last 168 hours.   Recent Results (from the past 240 hour(s))  Resp Panel by RT-PCR (Flu Sarah Ochoa&B, Covid) Nasopharyngeal Swab     Status: None   Collection Time: 02/19/21  4:16 PM   Specimen: Nasopharyngeal Swab; Nasopharyngeal(NP) swabs in vial transport medium  Result Value Ref Range Status   SARS Coronavirus 2 by RT PCR NEGATIVE NEGATIVE Final    Comment: (NOTE) SARS-CoV-2 target nucleic acids are NOT DETECTED.  The SARS-CoV-2 RNA is generally detectable in upper respiratory specimens during the acute phase of infection. The lowest concentration of SARS-CoV-2 viral copies this assay can detect is 138 copies/mL. Sarah Ochoa negative result does not preclude SARS-Cov-2 infection and  should not be used as the sole basis for treatment or other patient management decisions. Sarah Ochoa negative result may occur with  improper specimen collection/handling, submission of specimen other than nasopharyngeal swab, presence of viral mutation(s) within the areas targeted by this assay, and inadequate number of viral copies(<138 copies/mL). Coralie Stanke negative result must be combined with clinical observations, patient history, and epidemiological information. The expected result is Negative.  Fact Sheet for Patients:  BloggerCourse.com  Fact Sheet for Healthcare Providers:  SeriousBroker.it  This test is no t yet approved or cleared by the [place] and  has been authorized  for detection and/or diagnosis of SARS-CoV-2 by FDA under an Emergency Use Authorization (EUA). This EUA will remain  in effect (meaning this test can be used) for the duration of the COVID-19 declaration under Section 564(b)(1) of the Act, 21 U.S.C.section 360bbb-3(b)(1), unless the authorization is terminated  or revoked sooner.       Influenza Yoshiharu Brassell by PCR NEGATIVE NEGATIVE Final   Influenza B by PCR NEGATIVE NEGATIVE Final    Comment: (NOTE) The Xpert Xpress SARS-CoV-2/FLU/RSV plus assay is intended as an aid in the diagnosis of influenza from Nasopharyngeal swab specimens and should not be used as Shaughn Thomley sole basis for treatment. Nasal washings and aspirates are unacceptable for Xpert Xpress SARS-CoV-2/FLU/RSV testing.  Fact Sheet for Patients: BloggerCourse.com  Fact Sheet for Healthcare Providers: SeriousBroker.it  This test is not yet approved or cleared by the [place] FDA and has been authorized for detection and/or diagnosis of SARS-CoV-2 by FDA under an Emergency Use Authorization (EUA). This EUA will remain in effect (meaning this test can be used) for the duration of the COVID-19 declaration  under Section 564(b)(1) of the Act, 21 U.S.C. section 360bbb-3(b)(1), unless the authorization is terminated or revoked.  Performed at [facility], [address], [place], [place] [address]          Radiology Studies: CT ABDOMEN PELVIS WO CONTRAST  Result Date: 02/19/2021 CLINICAL DATA:  Left-sided abdominal pain with nausea, vomiting EXAM: CT ABDOMEN AND PELVIS WITHOUT CONTRAST TECHNIQUE: Multidetector CT imaging of the abdomen and pelvis was performed following the standard protocol without IV contrast. COMPARISON:  04/04/2020 FINDINGS: Lower chest: No acute abnormality. Hepatobiliary: No focal liver abnormality is seen. No gallstones, gallbladder wall thickening, or biliary dilatation. Pancreas: Unremarkable. Spleen: Unremarkable. Adrenals/Urinary Tract: Adrenals and kidneys are unremarkable. Bladder and distal ureters are obscured by artifact. Stomach/Bowel: Bowel loops in the inferior pelvis are obscured by artifact. Stomach is within normal limits. Minor distention of some small bowel loops in the pelvis with air-fluid levels. Colon is normal in caliber. Appendix is normal. Vascular/Lymphatic: Mild aortic atherosclerosis. No enlarged lymph nodes. Reproductive: Obscured by artifact. Other: No ascites.  Abdominal wall is unremarkable. Musculoskeletal: Bilateral total hip arthroplasties with associated streak artifact. No acute osseous abnormality. IMPRESSION: Minor distention of some small bowel loops in the pelvis with air-fluid levels. May reflect Laniesha Das very low-grade obstruction. Electronically Signed   By: Guadlupe Spanish M.D.   On: 02/19/2021 15:51   DG Abd 1 View  Addendum Date: 02/20/2021   ADDENDUM REPORT: 02/20/2021 14:55 ADDENDUM: Sentence in the impression should read: No bowel obstruction or free air. Electronically Signed   By: Bretta Bang III M.D.   On: 02/20/2021 14:55   Result Date: 02/20/2021 CLINICAL DATA:  Abdominal pain EXAM: ABDOMEN - 1 VIEW COMPARISON:  February 19, 2021 CT  abdomen and pelvis FINDINGS: There is moderate stool in the colon. There is no dilatation or air-fluid level to suggest obstruction. No free air. Mild scarring base. Bases otherwise. Patient is status post total replacements bilaterally. IMPRESSION: Bowel obstruction or free air.  Total hip replacements bilaterally. Electronically Signed: By: Bretta Bang III M.D. On: 02/20/2021 14:01        Scheduled Meds: . diatrizoate meglumine-sodium  90 mL Per NG tube Once  . enoxaparin (LOVENOX) injection  40 mg Subcutaneous Q24H  . folic acid  1 mg Oral Daily  . multivitamin with minerals  1 tablet Oral Daily  . pantoprazole (PROTONIX) IV  40 mg Intravenous Q24H  .  thiamine  100 mg Oral Daily   Or  . thiamine  100 mg Intravenous Daily   Continuous Infusions: . dextrose 5 % and 0.9 % NaCl with KCl 20 mEq/L 100 mL/hr at 02/20/21 1919     LOS: 0 days    Time spent: over 30 min    Lacretia Nicks, MD [facility]   To contact the attending provider between 7A-7P or the covering provider during after hours 7P-7A, please log into the web site [email or web address] and access using universal [place] password for that web site. If you do not have the password, please call the hospital operator.  02/20/2021, 7:55 PM

## 2021-02-20 NOTE — Care Management Obs Status (Signed)
[facility] NOTIFICATION   Patient Details  Name: Sarah Ochoa MRN: [id number] Date of Birth: 05-12-1961   Medicare Observation Status Notification Given:  Yes    Tommy Medal 02/20/2021, 4:34 PM

## 2021-02-20 NOTE — Progress Notes (Signed)
Patient given Gastrogafin PO due to patient not having NG tube in place. Order to give PO by Dr. Marcelline Deist. Patient tolerated well and radiology to do DG ABD 02/21/2021 at 0600.

## 2021-02-21 ENCOUNTER — Inpatient Hospital Stay (HOSPITAL_COMMUNITY): Payer: Medicare Other

## 2021-02-21 DIAGNOSIS — F1410 Cocaine abuse, uncomplicated: Secondary | ICD-10-CM

## 2021-02-21 DIAGNOSIS — J449 Chronic obstructive pulmonary disease, unspecified: Secondary | ICD-10-CM

## 2021-02-21 DIAGNOSIS — F32A Depression, unspecified: Secondary | ICD-10-CM

## 2021-02-21 DIAGNOSIS — I10 Essential (primary) hypertension: Secondary | ICD-10-CM

## 2021-02-21 DIAGNOSIS — F419 Anxiety disorder, unspecified: Secondary | ICD-10-CM

## 2021-02-21 LAB — CBC WITH DIFFERENTIAL/PLATELET
Abs Immature Granulocytes: 0.01 10*3/uL (ref 0.00–0.07)
Basophils Absolute: 0.1 10*3/uL (ref 0.0–0.1)
Basophils Relative: 1 %
Eosinophils Absolute: 0.3 10*3/uL (ref 0.0–0.5)
Eosinophils Relative: 3 %
HCT: 35.5 % — ABNORMAL LOW (ref 36.0–46.0)
Hemoglobin: 11.3 g/dL — ABNORMAL LOW (ref 12.0–15.0)
Immature Granulocytes: 0 %
Lymphocytes Relative: 43 %
Lymphs Abs: 3.5 10*3/uL (ref 0.7–4.0)
MCH: 29.7 pg (ref 26.0–34.0)
MCHC: 31.8 g/dL (ref 30.0–36.0)
MCV: 93.2 fL (ref 80.0–100.0)
Monocytes Absolute: 1 10*3/uL (ref 0.1–1.0)
Monocytes Relative: 12 %
Neutro Abs: 3.3 10*3/uL (ref 1.7–7.7)
Neutrophils Relative %: 41 %
Platelets: 291 10*3/uL (ref 150–400)
RBC: 3.81 MIL/uL — ABNORMAL LOW (ref 3.87–5.11)
RDW: 16.4 % — ABNORMAL HIGH (ref 11.5–15.5)
WBC: 8.2 10*3/uL (ref 4.0–10.5)
nRBC: 0 % (ref 0.0–0.2)

## 2021-02-21 LAB — COMPREHENSIVE METABOLIC PANEL
ALT: 14 U/L (ref 0–44)
AST: 21 U/L (ref 15–41)
Albumin: 3.2 g/dL — ABNORMAL LOW (ref 3.5–5.0)
Alkaline Phosphatase: 52 U/L (ref 38–126)
Anion gap: 4 — ABNORMAL LOW (ref 5–15)
BUN: 5 mg/dL — ABNORMAL LOW (ref 6–20)
CO2: 23 mmol/L (ref 22–32)
Calcium: 8.5 mg/dL — ABNORMAL LOW (ref 8.9–10.3)
Chloride: 113 mmol/L — ABNORMAL HIGH (ref 98–111)
Creatinine, Ser: 0.57 mg/dL (ref 0.44–1.00)
GFR, Estimated: >60 mL/min (ref >60)
Glucose, Bld: 110 mg/dL — ABNORMAL HIGH (ref 70–99)
Potassium: 3.4 mmol/L — ABNORMAL LOW (ref 3.5–5.1)
Sodium: 140 mmol/L (ref 135–145)
Total Bilirubin: 0.5 mg/dL (ref 0.3–1.2)
Total Protein: 6.4 g/dL — ABNORMAL LOW (ref 6.5–8.1)

## 2021-02-21 LAB — MAGNESIUM: Magnesium: 1.8 mg/dL (ref 1.7–2.4)

## 2021-02-21 LAB — PHOSPHORUS: Phosphorus: 2.3 mg/dL — ABNORMAL LOW (ref 2.5–4.6)

## 2021-02-21 MED ORDER — BOOST / RESOURCE BREEZE PO LIQD CUSTOM
1.0000 | Freq: Three times a day (TID) | ORAL | Status: DC
  Administered 2021-02-21 – 2021-02-22 (×3): 1 via ORAL

## 2021-02-21 MED ORDER — PROSOURCE PLUS PO LIQD
30.0000 mL | Freq: Two times a day (BID) | ORAL | Status: DC
  Administered 2021-02-21 – 2021-02-22 (×2): 30 mL via ORAL
  Filled 2021-02-21 (×2): qty 30

## 2021-02-21 MED ORDER — MORPHINE SULFATE (PF) 2 MG/ML IV SOLN
2.0000 mg | INTRAVENOUS | Status: DC | PRN
Start: 2021-02-21 — End: 2021-02-22

## 2021-02-21 MED ORDER — PANTOPRAZOLE SODIUM 40 MG PO TBEC
40.0000 mg | DELAYED_RELEASE_TABLET | Freq: Every day | ORAL | Status: DC
  Administered 2021-02-21 – 2021-02-22 (×2): 40 mg via ORAL
  Filled 2021-02-21 (×2): qty 1

## 2021-02-21 MED ORDER — FAMOTIDINE 20 MG PO TABS
20.0000 mg | ORAL_TABLET | Freq: Every day | ORAL | Status: DC
  Administered 2021-02-21: 20 mg via ORAL
  Filled 2021-02-21: qty 1

## 2021-02-21 MED ORDER — DICYCLOMINE HCL 10 MG PO CAPS
10.0000 mg | ORAL_CAPSULE | Freq: Two times a day (BID) | ORAL | Status: DC | PRN
  Administered 2021-02-21: 10 mg via ORAL
  Filled 2021-02-21: qty 1

## 2021-02-21 MED ORDER — HYDROCHLOROTHIAZIDE 25 MG PO TABS
25.0000 mg | ORAL_TABLET | Freq: Every day | ORAL | Status: DC
  Administered 2021-02-21 – 2021-02-22 (×2): 25 mg via ORAL
  Filled 2021-02-21 (×2): qty 1

## 2021-02-21 MED ORDER — SERTRALINE HCL 50 MG PO TABS
100.0000 mg | ORAL_TABLET | Freq: Every day | ORAL | Status: DC
  Administered 2021-02-21 – 2021-02-22 (×2): 100 mg via ORAL
  Filled 2021-02-21 (×2): qty 2

## 2021-02-21 MED ORDER — ESTRADIOL 1 MG PO TABS
2.0000 mg | ORAL_TABLET | Freq: Every day | ORAL | Status: DC
  Administered 2021-02-21 – 2021-02-22 (×2): 2 mg via ORAL
  Filled 2021-02-21 (×3): qty 2

## 2021-02-21 MED ORDER — POTASSIUM CHLORIDE CRYS ER 10 MEQ PO TBCR
10.0000 meq | EXTENDED_RELEASE_TABLET | Freq: Every day | ORAL | Status: DC
  Administered 2021-02-21 – 2021-02-22 (×2): 10 meq via ORAL
  Filled 2021-02-21 (×2): qty 1

## 2021-02-21 MED ORDER — PROCHLORPERAZINE EDISYLATE 10 MG/2ML IJ SOLN
10.0000 mg | INTRAMUSCULAR | Status: DC | PRN
  Administered 2021-02-21: 10 mg via INTRAVENOUS
  Filled 2021-02-21: qty 2

## 2021-02-21 MED ORDER — AMLODIPINE BESYLATE 5 MG PO TABS
10.0000 mg | ORAL_TABLET | Freq: Every day | ORAL | Status: DC
  Administered 2021-02-21 – 2021-02-22 (×2): 10 mg via ORAL
  Filled 2021-02-21 (×2): qty 2

## 2021-02-21 NOTE — Progress Notes (Signed)
PROGRESS NOTE    TEAUNNA Ochoa  XTG:[id number] DOB: [birth year] DOA: 02/19/2021 PCP: Avon Gully, MD   Chief Complaint  Patient presents with  . Abdominal Pain    Brief Narrative:  Sarah Ochoa is a 60 y.o. 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  She presented to the ED with complaints of generalized abdominal pain of 1 week duration that is worsening in severity.  She endorses abdominal bloating, she also reports 3-4 episodes of vomiting over the past week.  Last bowel movement was yesterday, she reports it was hard and small.  She reports urinary frequency and urgency over the past week also.  But denies pain with urination.  She has had abdominal surgeries in the past-abdominal hysterectomy.  ED Course: Blood pressure systolic 170s to 270J, lipase normal 39.  UA -small ketones, blood alcohol level less than 10.  Abdominal CT shows distention of some small bowel loops in the pelvis with air-fluid levels, may reflect a very low-grade obstruction.  Assessment & Plan:   Principal Problem:   Bowel obstruction (HCC) Active Problems:   COPD (chronic obstructive pulmonary disease) (HCC)   HTN (hypertension)   Alcohol abuse   Cocaine abuse (HCC)   Anxiety and depression   SBO (small bowel obstruction) (HCC)  Small Bowel obstruction  Left Upper Quadrant Pain -, IMPROVING.  CT shows minor distention of small bowel loops in the pelvis with air-fluid levels, very low-grade obstruction.  With vomiting and abdominal bloating.  History of abdominal hysterectomy. - KUB 6/7 with no bowel obstruction or free air - Pt had large BM, start clear liquid diet, advance as tolerated, added compazine for n/v - follow small bowel protocol.    - LUQ discomfort on exam - CT without concerning findings, normal lipase - with hx etoh abuse, ?Gastritis related to etoh use ->  continue PPI - reduce IVF - zofran, PPI - consider surgery c/s as needed  HTN-blood pressure elevated up to 180s. -resume HCTZ 25, Norvasc 10 -IV hydralazine prn   Urinary frequency and urgency- ?overactive bladder.  UA unremarkable.  Asthma, COPD-stable. -Albuterol nebulizers as needed, resume home bronchodilator regimen  Alcohol abuse, substance use-blood alcohol level less than 10.  She reports her last alcoholic beverage was on Saturday 2 days ago, she reports she took 3 shots of vodka.  She denies daily alcohol intake. -CIWA ordered -Thiamine folate multivitamin - magnesium, phosphorus WNL - UDS positive for cocaine/THC  Anxiety/depression -resume home Zoloft   DVT prophylaxis: lovenox Code Status: full  Family Communication: plan of care discussed with patient who verbalized understanding Disposition: home   Status is: Inpatient   The patient will require care spanning > 2 midnights and should be moved to inpatient because: Inpatient level of care appropriate due to severity of illness  Dispo: The patient is from: Home              Anticipated d/c is to: Home              Patient currently is not medically stable to d/c.   Difficult to place patient No  Consultants:   none  Procedures:   none  Antimicrobials:  Anti-infectives (From admission, onward)   None     Subjective: Pt reports that she is feeling a lot better, she would like to try starting a diet.    Objective: Vitals:   02/20/21 0505 02/20/21  1340 02/20/21 2000 02/21/21 0451  BP: (!) 164/102 130/83 (!) 143/95 (!) 164/81  Pulse: 63 60 64 65  Resp: 18 18 20 16   Temp: 98.4 F (36.9 C) 97.6 F (36.4 C) 98.2 F (36.8 C) 98.6 F (37 C)  TempSrc: Oral Oral Oral Oral  SpO2: 98% 100% 99% 96%  Weight:      Height:        Intake/Output Summary (Last 24 hours) at 02/21/2021 1227 Last data filed at 02/21/2021 0500 Gross per 24 hour  Intake 2558.03 ml  Output 500 ml  Net 2058.03 ml    Filed Weights   02/19/21 1207 02/19/21 1209 02/19/21 2100  Weight: 53.1 kg 53.1 kg 52.7 kg   Examination:  General exam: emaciated female, awake, alert, NAD.  Respiratory system: no increased work of breathing.  Cardiovascular system: normal s1, s2 sounds.   Gastrointestinal system: Abdomen: mild generalized tenderness. No guarding.  Central nervous system: Alert and oriented. No focal neurological deficits. Extremities:  No CCE.  Skin: No rashes, lesions or ulcers Psychiatry: Judgement and insight appear poor. Mood & affect appropriate.   Data Reviewed: I have personally reviewed following labs and imaging studies  CBC: Recent Labs  Lab 02/19/21 1254 02/20/21 0817 02/21/21 0520  WBC 9.0 8.5  8.5 8.2  NEUTROABS  --  3.8 3.3  HGB 12.9 11.8*  11.9* 11.3*  HCT 39.8 35.5*  36.3 35.5*  MCV 92.3 91.0  91.2 93.2  PLT 290 283  301 291    Basic Metabolic Panel: Recent Labs  Lab 02/19/21 1254 02/19/21 1541 02/20/21 0817 02/21/21 0520  NA 133*  --  139  139 140  K 3.6  --  3.3*  3.3* 3.4*  CL 104  --  110  110 113*  CO2 21*  --  24  23 23   GLUCOSE 93  --  92  94 110*  BUN 15  --  8  7 5*  CREATININE 0.72  --  0.64  0.59 0.57  CALCIUM 8.6*  --  8.4*  8.4* 8.5*  MG  --  2.1  --  1.8  PHOS  --  1.9*  --  2.3*    GFR: Estimated Creatinine Clearance: 63.8 mL/min (by C-G formula based on SCr of 0.57 mg/dL).  Liver Function Tests: Recent Labs  Lab 02/19/21 1254 02/20/21 0817 02/21/21 0520  AST 26 25 21   ALT 17 15 14   ALKPHOS 68 56 52  BILITOT 0.7 0.6 0.5  PROT 7.7 6.3* 6.4*  ALBUMIN 3.9 3.2* 3.2*    CBG: Recent Labs  Lab 02/20/21 2036  GLUCAP 130*     Recent Results (from the past 240 hour(s))  Resp Panel by RT-PCR (Flu A&B, Covid) Nasopharyngeal Swab     Status: None   Collection Time: 02/19/21  4:16 PM   Specimen: Nasopharyngeal Swab; Nasopharyngeal(NP) swabs in vial transport medium  Result Value Ref Range Status   SARS Coronavirus 2  by RT PCR NEGATIVE NEGATIVE Final    Comment: (NOTE) SARS-CoV-2 target nucleic acids are NOT DETECTED.  The SARS-CoV-2 RNA is generally detectable in upper respiratory specimens during the acute phase of infection. The lowest concentration of SARS-CoV-2 viral copies this assay can detect is 138 copies/mL. A negative result does not preclude SARS-Cov-2 infection and should not be used as the sole basis for treatment or other patient management decisions. A negative result may occur with  improper specimen collection/handling, submission of specimen other than nasopharyngeal swab, presence of  viral mutation(s) within the areas targeted by this assay, and inadequate number of viral copies(<138 copies/mL). A negative result must be combined with clinical observations, patient history, and epidemiological information. The expected result is Negative.  Fact Sheet for Patients:  BloggerCourse.com  Fact Sheet for Healthcare Providers:  SeriousBroker.it  This test is no t yet approved or cleared by the [place] FDA and  has been authorized for detection and/or diagnosis of SARS-CoV-2 by FDA under an Emergency Use Authorization (EUA). This EUA will remain  in effect (meaning this test can be used) for the duration of the COVID-19 declaration under Section 564(b)(1) of the Act, 21 U.S.C.section 360bbb-3(b)(1), unless the authorization is terminated  or revoked sooner.       Influenza A by PCR NEGATIVE NEGATIVE Final   Influenza B by PCR NEGATIVE NEGATIVE Final    Comment: (NOTE) The Xpert Xpress SARS-CoV-2/FLU/RSV plus assay is intended as an aid in the diagnosis of influenza from Nasopharyngeal swab specimens and should not be used as a sole basis for treatment. Nasal washings and aspirates are unacceptable for Xpert Xpress SARS-CoV-2/FLU/RSV testing.  Fact Sheet for Patients: BloggerCourse.com  Fact  Sheet for Healthcare Providers: SeriousBroker.it  This test is not yet approved or cleared by the [place] FDA and has been authorized for detection and/or diagnosis of SARS-CoV-2 by FDA under an Emergency Use Authorization (EUA). This EUA will remain in effect (meaning this test can be used) for the duration of the COVID-19 declaration under Section 564(b)(1) of the Act, 21 U.S.C. section 360bbb-3(b)(1), unless the authorization is terminated or revoked.  Performed at [facility], [address], [place], [place] [address]    Radiology Studies: CT ABDOMEN PELVIS WO CONTRAST  Result Date: 02/19/2021 CLINICAL DATA:  Left-sided abdominal pain with nausea, vomiting EXAM: CT ABDOMEN AND PELVIS WITHOUT CONTRAST TECHNIQUE: Multidetector CT imaging of the abdomen and pelvis was performed following the standard protocol without IV contrast. COMPARISON:  04/04/2020 FINDINGS: Lower chest: No acute abnormality. Hepatobiliary: No focal liver abnormality is seen. No gallstones, gallbladder wall thickening, or biliary dilatation. Pancreas: Unremarkable. Spleen: Unremarkable. Adrenals/Urinary Tract: Adrenals and kidneys are unremarkable. Bladder and distal ureters are obscured by artifact. Stomach/Bowel: Bowel loops in the inferior pelvis are obscured by artifact. Stomach is within normal limits. Minor distention of some small bowel loops in the pelvis with air-fluid levels. Colon is normal in caliber. Appendix is normal. Vascular/Lymphatic: Mild aortic atherosclerosis. No enlarged lymph nodes. Reproductive: Obscured by artifact. Other: No ascites.  Abdominal wall is unremarkable. Musculoskeletal: Bilateral total hip arthroplasties with associated streak artifact. No acute osseous abnormality. IMPRESSION: Minor distention of some small bowel loops in the pelvis with air-fluid levels. May reflect a very low-grade obstruction. Electronically Signed   By: Guadlupe Spanish M.D.   On:  02/19/2021 15:51   DG Abd 1 View  Addendum Date: 02/20/2021   ADDENDUM REPORT: 02/20/2021 14:55 ADDENDUM: Sentence in the impression should read: No bowel obstruction or free air. Electronically Signed   By: Bretta Bang III M.D.   On: 02/20/2021 14:55   Result Date: 02/20/2021 CLINICAL DATA:  Abdominal pain EXAM: ABDOMEN - 1 VIEW COMPARISON:  February 19, 2021 CT abdomen and pelvis FINDINGS: There is moderate stool in the colon. There is no dilatation or air-fluid level to suggest obstruction. No free air. Mild scarring base. Bases otherwise. Patient is status post total replacements bilaterally. IMPRESSION: Bowel obstruction or free air.  Total hip replacements bilaterally. Electronically Signed: By: Bretta Bang III  M.D. On: 02/20/2021 14:01   DG Abd Portable 1V-Small Bowel Obstruction Protocol-initial, 8 hr delay  Result Date: 02/21/2021 CLINICAL DATA:  Small-bowel obstruction.  Delayed imaging. EXAM: PORTABLE ABDOMEN - 1 VIEW COMPARISON:  02/20/2021.  CT 02/19/2021. FINDINGS: Oral contrast noted throughout the colon. No small bowel distention. No free air. No acute bony abnormality. Bilateral total hip replacements. IMPRESSION: Oral contrast noted throughout the colon. No small bowel distention. No free air. Electronically Signed   By: Maisie Fus  Register   On: 02/21/2021 06:22   Scheduled Meds: . enoxaparin (LOVENOX) injection  40 mg Subcutaneous Q24H  . folic acid  1 mg Oral Daily  . multivitamin with minerals  1 tablet Oral Daily  . pantoprazole (PROTONIX) IV  40 mg Intravenous Q24H  . thiamine  100 mg Oral Daily   Or  . thiamine  100 mg Intravenous Daily   Continuous Infusions: . dextrose 5 % and 0.9 % NaCl with KCl 20 mEq/L 100 mL/hr at 02/21/21 0657    LOS: 1 day   Time spent: over 30 min  Maley Venezia, MD How to contact the [facility] Attending or Consulting provider 7A - 7P or covering provider during after hours 7P -7A, for this patient?  1. Check the care team in [facility] and look  for a) attending/consulting [facility] provider listed and b) the [facility] team listed 2. Log into [email or web address] and use [facility]'s universal password to access. If you do not have the password, please contact the hospital operator. 3. Locate the [facility] provider you are looking for under [facility] and page to a number that you can be directly reached. 4. If you still have difficulty reaching the provider, please page the [facility] (Director on Call) for the Hospitalists listed on amion for assistance.  To contact the attending provider between 7A-7P or the covering provider during after hours 7P-7A, please log into the web site [email or web address] and access using universal [place] password for that web site. If you do not have the password, please call the hospital operator.  02/21/2021, 12:27 PM

## 2021-02-21 NOTE — Progress Notes (Signed)
Patient had one large bowel movement. Brown liquid with 4-5 solid pieces in stool

## 2021-02-21 NOTE — Progress Notes (Signed)
Initial Nutrition Assessment  DOCUMENTATION CODES:      INTERVENTION:  Boost Breeze po TID, each supplement provides 250 kcal and 9 grams of protein   ProSource 30 ml BID (mix with juice to improve pallability/tolerance)   NUTRITION DIAGNOSIS:   Inadequate oral intake related to acute illness (abdominal pain, vomiting) as evidenced by per patient/family report,energy intake < or equal to 50% for > or equal to 5 days.   GOAL:  Patient will meet greater than or equal to 90% of their needs  MONITOR:  Diet advancement,Labs,Weight trends,PO intake,Supplement acceptance  REASON FOR ASSESSMENT:   Malnutrition Screening Tool    ASSESSMENT: Patient is a 60 yo female with COPD, ETOH/Cocaine abuse, pancreatitis, avascular necrosis s/p bilateral hip replacement. Presents with abdominal pain and vomiting, concern for bowel obstruction.  Patient is tearful during RD visit. She consumed 100% of her clear liquids and immediately vomited. Patient reports poor tolerance of po's >5 days.   Acute weight loss - 2.2 kg (4%) the past month. Patient reports usual weight ~ 130 lb. This coincides with her weight in February and March.    Medications reviewed and include: Pepcid, Folvite, Protonix, Klor-con, MVI.  IVF-D5/NaCl/KCL @ 65 ml/hr  Labs: BMP Latest Ref Rng & Units 02/21/2021 02/20/2021 02/20/2021  Glucose 70 - 99 mg/dL 110(H) 92 94  BUN 6 - 20 mg/dL 5(L) 8 7  Creatinine 0.44 - 1.00 mg/dL 0.57 0.64 0.59  BUN/Creat Ratio 6 - 22 (calc) - - -  Sodium 135 - 145 mmol/L 140 139 139  Potassium 3.5 - 5.1 mmol/L 3.4(L) 3.3(L) 3.3(L)  Chloride 98 - 111 mmol/L 113(H) 110 110  CO2 22 - 32 mmol/L 23 24 23   Calcium 8.9 - 10.3 mg/dL 8.5(L) 8.4(L) 8.4(L)     NUTRITION - FOCUSED PHYSICAL EXAM:  Nutrition-Focused physical exam completed. Findings are no fat depletion, mild temporal, moderate clavicle muscle depletion, and no edema.     Diet Order:   Diet Order            Diet clear liquid Room  service appropriate? Yes; Fluid consistency: Thin  Diet effective now                 EDUCATION NEEDS:  Education needs have been addressed  Skin:  Skin Assessment: Reviewed RN Assessment  Last BM:  6/8 large type 5  Height:   Ht Readings from Last 1 Encounters:  02/19/21 5\' 5"  (1.651 m)    Weight:   Wt Readings from Last 1 Encounters:  02/19/21 52.7 kg    Ideal Body Weight:   57 kg  BMI:  Body mass index is 19.33 kg/m.  Estimated Nutritional Needs:   Kcal:  1600-1800  Protein:  74-85 gr  Fluid:  >1500 ml daily   Colman Cater MS,RD,CSG,LDN Contact: Shea Evans

## 2021-02-22 MED ORDER — THIAMINE HCL 100 MG PO TABS: 100.0000 mg | ORAL_TABLET | Freq: Every day | ORAL | 0 refills | Status: DC

## 2021-02-22 MED ORDER — POLYETHYLENE GLYCOL 3350 17 G PO PACK
17.0000 g | PACK | Freq: Every day | ORAL | Status: DC | PRN
  Administered 2021-02-22: 17 g via ORAL
  Filled 2021-02-22: qty 1

## 2021-02-22 MED ORDER — POTASSIUM CHLORIDE CRYS ER 20 MEQ PO TBCR
40.0000 meq | EXTENDED_RELEASE_TABLET | Freq: Once | ORAL | Status: AC
  Administered 2021-02-22: 40 meq via ORAL
  Filled 2021-02-22: qty 2

## 2021-02-22 MED ORDER — ADULT MULTIVITAMIN W/MINERALS CH: 1.0000 | ORAL_TABLET | Freq: Every day | ORAL | Status: AC

## 2021-02-22 MED ORDER — PANTOPRAZOLE SODIUM 40 MG PO TBEC: 40.0000 mg | DELAYED_RELEASE_TABLET | Freq: Every day | ORAL | Status: AC

## 2021-02-22 NOTE — Discharge Instructions (Signed)
IMPORTANT INFORMATION: PAY CLOSE ATTENTION  ° °PHYSICIAN DISCHARGE INSTRUCTIONS ° °Follow with Primary care provider  Fanta, Tesfaye, MD  and other consultants as instructed by your Hospitalist Physician ° °SEEK MEDICAL CARE OR RETURN TO EMERGENCY ROOM IF SYMPTOMS COME BACK, WORSEN OR NEW PROBLEM DEVELOPS  ° °Please note: °You were cared for by a hospitalist during your hospital stay. Every effort will be made to forward records to your primary care provider.  You can request that your primary care provider send for your hospital records if they have not received them.  Once you are discharged, your primary care physician will handle any further medical issues. Please note that NO REFILLS for any discharge medications will be authorized once you are discharged, as it is imperative that you return to your primary care physician (or establish a relationship with a primary care physician if you do not have one) for your post hospital discharge needs so that they can reassess your need for medications and monitor your lab values. ° °Please get a complete blood count and chemistry panel checked by your Primary MD at your next visit, and again as instructed by your Primary MD. ° °Get Medicines reviewed and adjusted: °Please take all your medications with you for your next visit with your Primary MD ° °Laboratory/radiological data: °Please request your Primary MD to go over all hospital tests and procedure/radiological results at the follow up, please ask your primary care provider to get all Hospital records sent to his/her office. ° °In some cases, they will be blood work, cultures and biopsy results pending at the time of your discharge. Please request that your primary care provider follow up on these results. ° °If you are diabetic, please bring your blood sugar readings with you to your follow up appointment with primary care.   ° °Please call and make your follow up appointments as soon as possible.   ° °Also Note  the following: °If you experience worsening of your admission symptoms, develop shortness of breath, life threatening emergency, suicidal or homicidal thoughts you must seek medical attention immediately by calling 911 or calling your MD immediately  if symptoms less severe. ° °You must read complete instructions/literature along with all the possible adverse reactions/side effects for all the Medicines you take and that have been prescribed to you. Take any new Medicines after you have completely understood and accpet all the possible adverse reactions/side effects.  ° °Do not drive when taking Pain medications or sleeping medications (Benzodiazepines) ° °Do not take more than prescribed Pain, Sleep and Anxiety Medications. It is not advisable to combine anxiety,sleep and pain medications without talking with your primary care practitioner ° °Special Instructions: If you have smoked or chewed Tobacco  in the last 2 yrs please stop smoking, stop any regular Alcohol  and or any Recreational drug use. ° °Wear Seat belts while driving.  Do not drive if taking any narcotic, mind altering or controlled substances or recreational drugs or alcohol.  ° ° ° ° ° ° °

## 2021-02-22 NOTE — Progress Notes (Signed)
Physician Discharge Summary  Sarah Ochoa IRC:[id number] DOB: 1960-10-17 DOA: 02/19/2021  PCP: Rosita Fire, MD  Admit date: 02/19/2021 Discharge date: 02/22/2021  Admitted From:  Home  Disposition: Home   Recommendations for Outpatient Follow-up:  Follow up with PCP in 1 weeks  Discharge Condition: STABLE   CODE STATUS: FULL DIET: soft foods recommended    Brief Hospitalization Summary: Please see all hospital notes, images, labs for full details of the hospitalization. ADMISSION HPI:  Sarah Ochoa is a 60 y.o. 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  She presented to the ED with complaints of generalized abdominal pain of 1 week duration that is worsening in severity.  She endorses abdominal bloating, she also reports 3-4 episodes of vomiting over the past week.  Last bowel movement was yesterday, she reports it was hard and small.  She reports urinary frequency and urgency over the past week also.  But denies pain with urination.  She has had abdominal surgeries in the past-abdominal hysterectomy.   ED Course: Blood pressure systolic 510C to 585I, lipase normal 39.  UA -small ketones, blood alcohol level less than 10.  Abdominal CT shows distention of some small bowel loops in the pelvis with air-fluid levels, may reflect a very low-grade obstruction.    Hospital Course  Small Bowel obstruction  Left Upper Quadrant Pain - RESOLVED.  CT showed minor distention of small bowel loops in the pelvis with air-fluid levels, very low-grade obstruction.  With vomiting and abdominal bloating.  History of abdominal hysterectomy. - KUB 6/7 with no bowel obstruction or free air - Pt had large BM, tolerating soft diet.  - LUQ discomfort on exam RESOLVED - CT without concerning findings, normal lipase - with hx etoh abuse, Gastritis related to etoh use -> continue PPI - Pt  was given IVF - zofran, PPI   HTN- essential  -resume HCTZ 25, Norvasc 10 -Follow up outpatient with PCP   Urinary frequency and urgency- ?overactive bladder.  UA unremarkable.   Asthma, COPD-stable. -Albuterol nebulizers as needed, resume home bronchodilator regimen   Alcohol abuse, substance use-blood alcohol level less than 10.  She reports her last alcoholic beverage was on Saturday 2 days ago, she reports she took 3 shots of vodka.  She denies daily alcohol intake. -CIWA ordered -Thiamine folate multivitamin - magnesium, phosphorus repleted - UDS positive for cocaine/THC (TOC consulted for resources), written information given on treatment resources   Anxiety/depression -resume home Zoloft   DVT prophylaxis: lovenox Code Status: full Family Communication: plan of care discussed with patient who verbalized understanding Disposition: home  Discharge Diagnoses:  Principal Problem:   Bowel obstruction (HCC) Active Problems:   COPD (chronic obstructive pulmonary disease) (HCC)   HTN (hypertension)   Alcohol abuse   Cocaine abuse (HCC)   Anxiety and depression   SBO (small bowel obstruction) ([place])   Discharge Instructions:  Allergies as of 02/22/2021       Reactions   Codeine Hives   Sulfa Antibiotics Hives   Sulfonamide Derivatives Hives   Sulfamethoxazole Rash        Medication List     STOP taking these medications    HYDROcodone-acetaminophen 5-325 MG tablet Commonly known as: NORCO/VICODIN       TAKE these medications    albuterol 108 (90 Base) MCG/ACT inhaler Commonly known as: VENTOLIN HFA Inhale 1-2 puffs into the lungs every 6 (six) hours  as needed for wheezing or shortness of breath.   albuterol (2.5 MG/3ML) 0.083% nebulizer solution Commonly known as: PROVENTIL Take 2.5 mg by nebulization every 4 (four) hours as needed for wheezing.   amLODipine 10 MG tablet Commonly known as: NORVASC Take 1 tablet by mouth daily.   BIOTIN PO Take  500 mg by mouth daily.   dicyclomine 10 MG capsule Commonly known as: BENTYL Take 1 capsule (10 mg total) by mouth 2 (two) times daily as needed for spasms.   diphenhydrAMINE 25 MG tablet Commonly known as: BENADRYL Take 25 mg by mouth every 4 (four) hours as needed for allergies.   fluticasone 50 MCG/ACT nasal spray Commonly known as: FLONASE Place 2 sprays into the nose daily as needed for allergies.   hydrochlorothiazide 25 MG tablet Commonly known as: HYDRODIURIL Take 25 mg by mouth daily.   Klor-Con 10 10 MEQ tablet Generic drug: potassium chloride Take 10 mEq by mouth daily.   lactulose 10 GM/15ML solution Commonly known as: CHRONULAC Take 20 g by mouth 2 (two) times daily as needed for mild constipation.   montelukast 10 MG tablet Commonly known as: SINGULAIR Take 10 mg by mouth daily.   multivitamin with minerals Tabs tablet Take 1 tablet by mouth daily. Start taking on: February 23, 2021   pantoprazole 40 MG tablet Commonly known as: PROTONIX Take 1 tablet (40 mg total) by mouth daily.   sertraline 100 MG tablet Commonly known as: ZOLOFT Take 100 mg by mouth daily.   thiamine 100 MG tablet Take 1 tablet (100 mg total) by mouth daily. Start taking on: February 23, 2021   tiZANidine 4 MG tablet Commonly known as: ZANAFLEX Take 1 tablet by mouth at bedtime as needed for spasms   traMADol-acetaminophen 37.5-325 MG tablet Commonly known as: ULTRACET Take 1-2 tablets by mouth every 4 (four) hours as needed for moderate pain.   Trelegy Ellipta 100-62.5-25 MCG/INH Aepb Generic drug: Fluticasone-Umeclidin-Vilant Inhale 1 puff into the lungs daily as needed (COPD).       ASK your doctor about these medications    estradiol 2 MG tablet Commonly known as: ESTRACE TAKE 1 TABLET BY MOUTH EVERY DAY   famotidine 20 MG tablet Commonly known as: PEPCID TAKE 1 TABLET BY MOUTH EVERYDAY AT BEDTIME        Follow-up Information     Rosita Fire, MD Follow up in  1 week(s).   Specialty: Internal Medicine Why: Hospital Follow Up Contact information: [place] [address] [phone]                Allergies  Allergen Reactions   Codeine Hives   Sulfa Antibiotics Hives   Sulfonamide Derivatives Hives   Sulfamethoxazole Rash   Allergies as of 02/22/2021       Reactions   Codeine Hives   Sulfa Antibiotics Hives   Sulfonamide Derivatives Hives   Sulfamethoxazole Rash        Medication List     STOP taking these medications    HYDROcodone-acetaminophen 5-325 MG tablet Commonly known as: NORCO/VICODIN       TAKE these medications    albuterol 108 (90 Base) MCG/ACT inhaler Commonly known as: VENTOLIN HFA Inhale 1-2 puffs into the lungs every 6 (six) hours as needed for wheezing or shortness of breath.   albuterol (2.5 MG/3ML) 0.083% nebulizer solution Commonly known as: PROVENTIL Take 2.5 mg by nebulization every 4 (four) hours as needed for wheezing.   amLODipine 10 MG  tablet Commonly known as: NORVASC Take 1 tablet by mouth daily.   BIOTIN PO Take 500 mg by mouth daily.   dicyclomine 10 MG capsule Commonly known as: BENTYL Take 1 capsule (10 mg total) by mouth 2 (two) times daily as needed for spasms.   diphenhydrAMINE 25 MG tablet Commonly known as: BENADRYL Take 25 mg by mouth every 4 (four) hours as needed for allergies.   fluticasone 50 MCG/ACT nasal spray Commonly known as: FLONASE Place 2 sprays into the nose daily as needed for allergies.   hydrochlorothiazide 25 MG tablet Commonly known as: HYDRODIURIL Take 25 mg by mouth daily.   Klor-Con 10 10 MEQ tablet Generic drug: potassium chloride Take 10 mEq by mouth daily.   lactulose 10 GM/15ML solution Commonly known as: CHRONULAC Take 20 g by mouth 2 (two) times daily as needed for mild constipation.   montelukast 10 MG tablet Commonly known as: SINGULAIR Take 10 mg by mouth daily.   multivitamin with minerals Tabs  tablet Take 1 tablet by mouth daily. Start taking on: February 23, 2021   pantoprazole 40 MG tablet Commonly known as: PROTONIX Take 1 tablet (40 mg total) by mouth daily.   sertraline 100 MG tablet Commonly known as: ZOLOFT Take 100 mg by mouth daily.   thiamine 100 MG tablet Take 1 tablet (100 mg total) by mouth daily. Start taking on: February 23, 2021   tiZANidine 4 MG tablet Commonly known as: ZANAFLEX Take 1 tablet by mouth at bedtime as needed for spasms   traMADol-acetaminophen 37.5-325 MG tablet Commonly known as: ULTRACET Take 1-2 tablets by mouth every 4 (four) hours as needed for moderate pain.   Trelegy Ellipta 100-62.5-25 MCG/INH Aepb Generic drug: Fluticasone-Umeclidin-Vilant Inhale 1 puff into the lungs daily as needed (COPD).       ASK your doctor about these medications    estradiol 2 MG tablet Commonly known as: ESTRACE TAKE 1 TABLET BY MOUTH EVERY DAY   famotidine 20 MG tablet Commonly known as: PEPCID TAKE 1 TABLET BY MOUTH EVERYDAY AT BEDTIME        Procedures/Studies: CT ABDOMEN PELVIS WO CONTRAST  Result Date: 02/19/2021 CLINICAL DATA:  Left-sided abdominal pain with nausea, vomiting EXAM: CT ABDOMEN AND PELVIS WITHOUT CONTRAST TECHNIQUE: Multidetector CT imaging of the abdomen and pelvis was performed following the standard protocol without IV contrast. COMPARISON:  04/04/2020 FINDINGS: Lower chest: No acute abnormality. Hepatobiliary: No focal liver abnormality is seen. No gallstones, gallbladder wall thickening, or biliary dilatation. Pancreas: Unremarkable. Spleen: Unremarkable. Adrenals/Urinary Tract: Adrenals and kidneys are unremarkable. Bladder and distal ureters are obscured by artifact. Stomach/Bowel: Bowel loops in the inferior pelvis are obscured by artifact. Stomach is within normal limits. Minor distention of some small bowel loops in the pelvis with air-fluid levels. Colon is normal in caliber. Appendix is normal. Vascular/Lymphatic: Mild  aortic atherosclerosis. No enlarged lymph nodes. Reproductive: Obscured by artifact. Other: No ascites.  Abdominal wall is unremarkable. Musculoskeletal: Bilateral total hip arthroplasties with associated streak artifact. No acute osseous abnormality. IMPRESSION: Minor distention of some small bowel loops in the pelvis with air-fluid levels. May reflect a very low-grade obstruction. Electronically Signed   By: Macy Mis M.D.   On: 02/19/2021 15:51   DG Abd 1 View  Addendum Date: 02/20/2021   ADDENDUM REPORT: 02/20/2021 14:55 ADDENDUM: Sentence in the impression should read: No bowel obstruction or free air. Electronically Signed   By: Lowella Grip III M.D.   On: 02/20/2021 14:55   Result  Date: 02/20/2021 CLINICAL DATA:  Abdominal pain EXAM: ABDOMEN - 1 VIEW COMPARISON:  February 19, 2021 CT abdomen and pelvis FINDINGS: There is moderate stool in the colon. There is no dilatation or air-fluid level to suggest obstruction. No free air. Mild scarring base. Bases otherwise. Patient is status post total replacements bilaterally. IMPRESSION: Bowel obstruction or free air.  Total hip replacements bilaterally. Electronically Signed: By: Lowella Grip III M.D. On: 02/20/2021 14:01   DG Abd Portable 1V-Small Bowel Obstruction Protocol-initial, 8 hr delay  Result Date: 02/21/2021 CLINICAL DATA:  Small-bowel obstruction.  Delayed imaging. EXAM: PORTABLE ABDOMEN - 1 VIEW COMPARISON:  02/20/2021.  CT 02/19/2021. FINDINGS: Oral contrast noted throughout the colon. No small bowel distention. No free air. No acute bony abnormality. Bilateral total hip replacements. IMPRESSION: Oral contrast noted throughout the colon. No small bowel distention. No free air. Electronically Signed   By: Marcello Moores  Register   On: 02/21/2021 06:22     Subjective: Pt reporting that she is feeling much better, had 3 bowel movements, tolerating diet well.  No abd pain.   Discharge Exam: Vitals:   02/22/21 0542 02/22/21 0747  BP: (!)  148/89 (!) 139/104  Pulse: 64 (!) 59  Resp: 14 18  Temp: 98.2 F (36.8 C) 97.9 F (36.6 C)  SpO2: 99% 99%   Vitals:   02/21/21 1826 02/21/21 2043 02/22/21 0542 02/22/21 0747  BP: (!) 141/79 (!) 141/89 (!) 148/89 (!) 139/104  Pulse: 74 71 64 (!) 59  Resp: 18 15 14 18   Temp: 98.5 F (36.9 C) 98.6 F (37 C) 98.2 F (36.8 C) 97.9 F (36.6 C)  TempSrc: Oral Oral Oral Oral  SpO2: 98% 99% 99% 99%  Weight:      Height:       General exam: emaciated female, awake, alert, NAD.  Respiratory system: no increased work of breathing.  Cardiovascular system: normal s1, s2 sounds.   Gastrointestinal system: Abdomen: mild generalized tenderness. No guarding.  Central nervous system: Alert and oriented. No focal neurological deficits. Extremities:  No CCE.  Skin: No rashes, lesions or ulcers Psychiatry: Judgement and insight appear poor. Mood & affect appropriate.   The results of significant diagnostics from this hospitalization (including imaging, microbiology, ancillary and laboratory) are listed below for reference.     Microbiology: Recent Results (from the past 240 hour(s))  Resp Panel by RT-PCR (Flu A&B, Covid) Nasopharyngeal Swab     Status: None   Collection Time: 02/19/21  4:16 PM   Specimen: Nasopharyngeal Swab; Nasopharyngeal(NP) swabs in vial transport medium  Result Value Ref Range Status   SARS Coronavirus 2 by RT PCR NEGATIVE NEGATIVE Final    Comment: (NOTE) SARS-CoV-2 target nucleic acids are NOT DETECTED.  The SARS-CoV-2 RNA is generally detectable in upper respiratory specimens during the acute phase of infection. The lowest concentration of SARS-CoV-2 viral copies this assay can detect is 138 copies/mL. A negative result does not preclude SARS-Cov-2 infection and should not be used as the sole basis for treatment or other patient management decisions. A negative result may occur with  improper specimen collection/handling, submission of specimen other than  nasopharyngeal swab, presence of viral mutation(s) within the areas targeted by this assay, and inadequate number of viral copies(<138 copies/mL). A negative result must be combined with clinical observations, patient history, and epidemiological information. The expected result is Negative.  Fact Sheet for Patients:  EntrepreneurPulse.com.au  Fact Sheet for Healthcare Providers:  IncredibleEmployment.be  This test is no t yet approved  or cleared by the [place] and  has been authorized for detection and/or diagnosis of SARS-CoV-2 by FDA under an Emergency Use Authorization (EUA). This EUA will remain  in effect (meaning this test can be used) for the duration of the COVID-19 declaration under Section 564(b)(1) of the Act, 21 U.S.C.section 360bbb-3(b)(1), unless the authorization is terminated  or revoked sooner.       Influenza A by PCR NEGATIVE NEGATIVE Final   Influenza B by PCR NEGATIVE NEGATIVE Final    Comment: (NOTE) The Xpert Xpress SARS-CoV-2/FLU/RSV plus assay is intended as an aid in the diagnosis of influenza from Nasopharyngeal swab specimens and should not be used as a sole basis for treatment. Nasal washings and aspirates are unacceptable for Xpert Xpress SARS-CoV-2/FLU/RSV testing.  Fact Sheet for Patients: EntrepreneurPulse.com.au  Fact Sheet for Healthcare Providers: IncredibleEmployment.be  This test is not yet approved or cleared by the [place] FDA and has been authorized for detection and/or diagnosis of SARS-CoV-2 by FDA under an Emergency Use Authorization (EUA). This EUA will remain in effect (meaning this test can be used) for the duration of the COVID-19 declaration under Section 564(b)(1) of the Act, 21 U.S.C. section 360bbb-3(b)(1), unless the authorization is terminated or revoked.  Performed at [facility], [address] [place], [place] [address]       Labs: BNP (last 3 results) No results for input(s): BNP in the last 8760 hours. Basic Metabolic Panel: Recent Labs  Lab 02/19/21 1254 02/19/21 1541 02/20/21 0817 02/21/21 0520  NA 133*  --  139  139 140  K 3.6  --  3.3*  3.3* 3.4*  CL 104  --  110  110 113*  CO2 21*  --  24  23 23   GLUCOSE 93  --  92  94 110*  BUN 15  --  8  7 5*  CREATININE 0.72  --  0.64  0.59 0.57  CALCIUM 8.6*  --  8.4*  8.4* 8.5*  MG  --  2.1  --  1.8  PHOS  --  1.9*  --  2.3*   Liver Function Tests: Recent Labs  Lab 02/19/21 1254 02/20/21 0817 02/21/21 0520  AST 26 25 21   ALT 17 15 14   ALKPHOS 68 56 52  BILITOT 0.7 0.6 0.5  PROT 7.7 6.3* 6.4*  ALBUMIN 3.9 3.2* 3.2*   Recent Labs  Lab 02/19/21 1254 02/20/21 0817  LIPASE 39 37   No results for input(s): AMMONIA in the last 168 hours. CBC: Recent Labs  Lab 02/19/21 1254 02/20/21 0817 02/21/21 0520  WBC 9.0 8.5  8.5 8.2  NEUTROABS  --  3.8 3.3  HGB 12.9 11.8*  11.9* 11.3*  HCT 39.8 35.5*  36.3 35.5*  MCV 92.3 91.0  91.2 93.2  PLT 290 283  301 291   Cardiac Enzymes: No results for input(s): CKTOTAL, CKMB, CKMBINDEX, TROPONINI in the last 168 hours. BNP: Invalid input(s): POCBNP CBG: Recent Labs  Lab 02/20/21 2036  GLUCAP 130*   D-Dimer No results for input(s): DDIMER in the last 72 hours. Hgb A1c No results for input(s): HGBA1C in the last 72 hours. Lipid Profile No results for input(s): CHOL, HDL, LDLCALC, TRIG, CHOLHDL, LDLDIRECT in the last 72 hours. Thyroid function studies No results for input(s): TSH, T4TOTAL, T3FREE, THYROIDAB in the last 72 hours.  Invalid input(s): FREET3 Anemia work up No results for input(s): VITAMINB12, FOLATE, FERRITIN, TIBC, IRON, RETICCTPCT in the last 72 hours. Urinalysis  Component Value Date/Time   COLORURINE COLORLESS (A) 02/19/2021 1211   APPEARANCEUR CLEAR 02/19/2021 1211   LABSPEC 1.005 02/19/2021 1211   PHURINE 6.0 02/19/2021 1211   GLUCOSEU NEGATIVE  02/19/2021 1211   HGBUR MODERATE (A) 02/19/2021 1211   BILIRUBINUR NEGATIVE 02/19/2021 1211   KETONESUR 5 (A) 02/19/2021 1211   PROTEINUR NEGATIVE 02/19/2021 1211   UROBILINOGEN 0.2 10/10/2014 1221   NITRITE NEGATIVE 02/19/2021 1211   LEUKOCYTESUR NEGATIVE 02/19/2021 1211   Sepsis Labs Invalid input(s): PROCALCITONIN,  WBC,  LACTICIDVEN Microbiology Recent Results (from the past 240 hour(s))  Resp Panel by RT-PCR (Flu A&B, Covid) Nasopharyngeal Swab     Status: None   Collection Time: 02/19/21  4:16 PM   Specimen: Nasopharyngeal Swab; Nasopharyngeal(NP) swabs in vial transport medium  Result Value Ref Range Status   SARS Coronavirus 2 by RT PCR NEGATIVE NEGATIVE Final    Comment: (NOTE) SARS-CoV-2 target nucleic acids are NOT DETECTED.  The SARS-CoV-2 RNA is generally detectable in upper respiratory specimens during the acute phase of infection. The lowest concentration of SARS-CoV-2 viral copies this assay can detect is 138 copies/mL. A negative result does not preclude SARS-Cov-2 infection and should not be used as the sole basis for treatment or other patient management decisions. A negative result may occur with  improper specimen collection/handling, submission of specimen other than nasopharyngeal swab, presence of viral mutation(s) within the areas targeted by this assay, and inadequate number of viral copies(<138 copies/mL). A negative result must be combined with clinical observations, patient history, and epidemiological information. The expected result is Negative.  Fact Sheet for Patients:  EntrepreneurPulse.com.au  Fact Sheet for Healthcare Providers:  IncredibleEmployment.be  This test is no t yet approved or cleared by the [place] FDA and  has been authorized for detection and/or diagnosis of SARS-CoV-2 by FDA under an Emergency Use Authorization (EUA). This EUA will remain  in effect (meaning this test can be used)  for the duration of the COVID-19 declaration under Section 564(b)(1) of the Act, 21 U.S.C.section 360bbb-3(b)(1), unless the authorization is terminated  or revoked sooner.       Influenza A by PCR NEGATIVE NEGATIVE Final   Influenza B by PCR NEGATIVE NEGATIVE Final    Comment: (NOTE) The Xpert Xpress SARS-CoV-2/FLU/RSV plus assay is intended as an aid in the diagnosis of influenza from Nasopharyngeal swab specimens and should not be used as a sole basis for treatment. Nasal washings and aspirates are unacceptable for Xpert Xpress SARS-CoV-2/FLU/RSV testing.  Fact Sheet for Patients: EntrepreneurPulse.com.au  Fact Sheet for Healthcare Providers: IncredibleEmployment.be  This test is not yet approved or cleared by the [place] FDA and has been authorized for detection and/or diagnosis of SARS-CoV-2 by FDA under an Emergency Use Authorization (EUA). This EUA will remain in effect (meaning this test can be used) for the duration of the COVID-19 declaration under Section 564(b)(1) of the Act, 21 U.S.C. section 360bbb-3(b)(1), unless the authorization is terminated or revoked.  Performed at [facility], [address], [place], [place] [address]     Time coordinating discharge: 36 mins   SIGNED:  Irwin Brakeman, MD  [facility] 02/22/2021, 10:35 AM How to contact the [facility] Attending or Consulting provider [place] or covering provider during after hours [place], for this patient?  Check the care team in [facility] and look for a) attending/consulting [facility] provider listed and b) the [facility] team listed Log into [email or web address] and use [facility]'s universal password to access. If you  do not have the password, please contact the hospital operator. Locate the [facility] provider you are looking for under [facility] and page to a number that you can be directly reached. If you still have difficulty reaching the provider, please page the [facility] (Director  on Call) for the Hospitalists listed on amion for assistance.

## 2021-02-22 NOTE — Discharge Summary (Signed)
Physician Discharge Summary  Sarah Ochoa QMV:[id number] DOB: Feb 23, 1961 DOA: 02/19/2021   PCP: Rosita Fire, MD   Admit date: 02/19/2021 Discharge date: 02/22/2021   Admitted From:  Home  Disposition: Home    Recommendations for Outpatient Follow-up:  Follow up with PCP in 1 weeks   Discharge Condition: STABLE   CODE STATUS: FULL DIET: soft foods recommended     Brief Hospitalization Summary: Please see all hospital notes, images, labs for full details of the hospitalization. ADMISSION HPI:  Sarah Ochoa is a 60 y.o. 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  She presented to the ED with complaints of generalized abdominal pain of 1 week duration that is worsening in severity.  She endorses abdominal bloating, she also reports 3-4 episodes of vomiting over the past week.  Last bowel movement was yesterday, she reports it was hard and small.  She reports urinary frequency and urgency over the past week also.  But denies pain with urination.  She has had abdominal surgeries in the past-abdominal hysterectomy.   ED Course: Blood pressure systolic 284X to 324M, lipase normal 39.  UA -small ketones, blood alcohol level less than 10.  Abdominal CT shows distention of some small bowel loops in the pelvis with air-fluid levels, may reflect a very low-grade obstruction.     Hospital Course  Small Bowel obstruction  Left Upper Quadrant Pain - RESOLVED.  CT showed minor distention of small bowel loops in the pelvis with air-fluid levels, very low-grade obstruction.  With vomiting and abdominal bloating.  History of abdominal hysterectomy. - KUB 6/7 with no bowel obstruction or free air - Pt had large BM, tolerating soft diet.  - LUQ discomfort on exam RESOLVED - CT without concerning findings, normal lipase - with hx etoh abuse, Gastritis related to etoh use -> continue  PPI - Pt was given IVF - zofran, PPI   HTN- essential  -resume HCTZ 25, Norvasc 10 -Follow up outpatient with PCP   Urinary frequency and urgency- ?overactive bladder.  UA unremarkable.   Asthma, COPD-stable. -Albuterol nebulizers as needed, resume home bronchodilator regimen   Alcohol abuse, substance use-blood alcohol level less than 10.  She reports her last alcoholic beverage was on Saturday 2 days ago, she reports she took 3 shots of vodka.  She denies daily alcohol intake. -CIWA ordered -Thiamine folate multivitamin - magnesium, phosphorus repleted - UDS positive for cocaine/THC (TOC consulted for resources), written information given on treatment resources   Anxiety/depression -resume home Zoloft   DVT prophylaxis: lovenox Code Status: full Family Communication: plan of care discussed with patient who verbalized understanding Disposition: home  Discharge Diagnoses:  Principal Problem:   Bowel obstruction (HCC) Active Problems:   COPD (chronic obstructive pulmonary disease) (HCC)   HTN (hypertension)   Alcohol abuse   Cocaine abuse (HCC)   Anxiety and depression   SBO (small bowel obstruction) ([place])     Discharge Instructions:   Allergies as of 02/22/2021         Reactions    Codeine Hives    Sulfa Antibiotics Hives    Sulfonamide Derivatives Hives    Sulfamethoxazole Rash            Medication List       STOP taking these medications     HYDROcodone-acetaminophen 5-325 MG tablet Commonly known as: NORCO/VICODIN  TAKE these medications     albuterol 108 (90 Base) MCG/ACT inhaler Commonly known as: VENTOLIN HFA Inhale 1-2 puffs into the lungs every 6 (six) hours as needed for wheezing or shortness of breath.    albuterol (2.5 MG/3ML) 0.083% nebulizer solution Commonly known as: PROVENTIL Take 2.5 mg by nebulization every 4 (four) hours as needed for wheezing.    amLODipine 10 MG tablet Commonly known as: NORVASC Take 1 tablet by  mouth daily.    BIOTIN PO Take 500 mg by mouth daily.    dicyclomine 10 MG capsule Commonly known as: BENTYL Take 1 capsule (10 mg total) by mouth 2 (two) times daily as needed for spasms.    diphenhydrAMINE 25 MG tablet Commonly known as: BENADRYL Take 25 mg by mouth every 4 (four) hours as needed for allergies.    fluticasone 50 MCG/ACT nasal spray Commonly known as: FLONASE Place 2 sprays into the nose daily as needed for allergies.    hydrochlorothiazide 25 MG tablet Commonly known as: HYDRODIURIL Take 25 mg by mouth daily.    Klor-Con 10 10 MEQ tablet Generic drug: potassium chloride Take 10 mEq by mouth daily.    lactulose 10 GM/15ML solution Commonly known as: CHRONULAC Take 20 g by mouth 2 (two) times daily as needed for mild constipation.    montelukast 10 MG tablet Commonly known as: SINGULAIR Take 10 mg by mouth daily.    multivitamin with minerals Tabs tablet Take 1 tablet by mouth daily. Start taking on: February 23, 2021    pantoprazole 40 MG tablet Commonly known as: PROTONIX Take 1 tablet (40 mg total) by mouth daily.    sertraline 100 MG tablet Commonly known as: ZOLOFT Take 100 mg by mouth daily.    thiamine 100 MG tablet Take 1 tablet (100 mg total) by mouth daily. Start taking on: February 23, 2021    tiZANidine 4 MG tablet Commonly known as: ZANAFLEX Take 1 tablet by mouth at bedtime as needed for spasms    traMADol-acetaminophen 37.5-325 MG tablet Commonly known as: ULTRACET Take 1-2 tablets by mouth every 4 (four) hours as needed for moderate pain.    Trelegy Ellipta 100-62.5-25 MCG/INH Aepb Generic drug: Fluticasone-Umeclidin-Vilant Inhale 1 puff into the lungs daily as needed (COPD).           ASK your doctor about these medications     estradiol 2 MG tablet Commonly known as: ESTRACE TAKE 1 TABLET BY MOUTH EVERY DAY    famotidine 20 MG tablet Commonly known as: PEPCID TAKE 1 TABLET BY MOUTH EVERYDAY AT BEDTIME              Follow-up Information       Rosita Fire, MD Follow up in 1 week(s).   Specialty: Internal Medicine Why: Hospital Follow Up Contact information: [place] [address] [phone]                            Allergies  Allergen Reactions   Codeine Hives   Sulfa Antibiotics Hives   Sulfonamide Derivatives Hives   Sulfamethoxazole Rash    Allergies as of 02/22/2021         Reactions    Codeine Hives    Sulfa Antibiotics Hives    Sulfonamide Derivatives Hives    Sulfamethoxazole Rash            Medication List       STOP taking these  medications     HYDROcodone-acetaminophen 5-325 MG tablet Commonly known as: NORCO/VICODIN           TAKE these medications     albuterol 108 (90 Base) MCG/ACT inhaler Commonly known as: VENTOLIN HFA Inhale 1-2 puffs into the lungs every 6 (six) hours as needed for wheezing or shortness of breath.    albuterol (2.5 MG/3ML) 0.083% nebulizer solution Commonly known as: PROVENTIL Take 2.5 mg by nebulization every 4 (four) hours as needed for wheezing.    amLODipine 10 MG tablet Commonly known as: NORVASC Take 1 tablet by mouth daily.    BIOTIN PO Take 500 mg by mouth daily.    dicyclomine 10 MG capsule Commonly known as: BENTYL Take 1 capsule (10 mg total) by mouth 2 (two) times daily as needed for spasms.    diphenhydrAMINE 25 MG tablet Commonly known as: BENADRYL Take 25 mg by mouth every 4 (four) hours as needed for allergies.    fluticasone 50 MCG/ACT nasal spray Commonly known as: FLONASE Place 2 sprays into the nose daily as needed for allergies.    hydrochlorothiazide 25 MG tablet Commonly known as: HYDRODIURIL Take 25 mg by mouth daily.    Klor-Con 10 10 MEQ tablet Generic drug: potassium chloride Take 10 mEq by mouth daily.    lactulose 10 GM/15ML solution Commonly known as: CHRONULAC Take 20 g by mouth 2 (two) times daily as needed for mild constipation.    montelukast 10  MG tablet Commonly known as: SINGULAIR Take 10 mg by mouth daily.    multivitamin with minerals Tabs tablet Take 1 tablet by mouth daily. Start taking on: February 23, 2021    pantoprazole 40 MG tablet Commonly known as: PROTONIX Take 1 tablet (40 mg total) by mouth daily.    sertraline 100 MG tablet Commonly known as: ZOLOFT Take 100 mg by mouth daily.    thiamine 100 MG tablet Take 1 tablet (100 mg total) by mouth daily. Start taking on: February 23, 2021    tiZANidine 4 MG tablet Commonly known as: ZANAFLEX Take 1 tablet by mouth at bedtime as needed for spasms    traMADol-acetaminophen 37.5-325 MG tablet Commonly known as: ULTRACET Take 1-2 tablets by mouth every 4 (four) hours as needed for moderate pain.    Trelegy Ellipta 100-62.5-25 MCG/INH Aepb Generic drug: Fluticasone-Umeclidin-Vilant Inhale 1 puff into the lungs daily as needed (COPD).           ASK your doctor about these medications     estradiol 2 MG tablet Commonly known as: ESTRACE TAKE 1 TABLET BY MOUTH EVERY DAY    famotidine 20 MG tablet Commonly known as: PEPCID TAKE 1 TABLET BY MOUTH EVERYDAY AT BEDTIME             Procedures/Studies: CT ABDOMEN PELVIS WO CONTRAST   Result Date: 02/19/2021 CLINICAL DATA:  Left-sided abdominal pain with nausea, vomiting EXAM: CT ABDOMEN AND PELVIS WITHOUT CONTRAST TECHNIQUE: Multidetector CT imaging of the abdomen and pelvis was performed following the standard protocol without IV contrast. COMPARISON:  04/04/2020 FINDINGS: Lower chest: No acute abnormality. Hepatobiliary: No focal liver abnormality is seen. No gallstones, gallbladder wall thickening, or biliary dilatation. Pancreas: Unremarkable. Spleen: Unremarkable. Adrenals/Urinary Tract: Adrenals and kidneys are unremarkable. Bladder and distal ureters are obscured by artifact. Stomach/Bowel: Bowel loops in the inferior pelvis are obscured by artifact. Stomach is within normal limits. Minor distention of some small  bowel loops in the pelvis with air-fluid levels. Colon is normal in  caliber. Appendix is normal. Vascular/Lymphatic: Mild aortic atherosclerosis. No enlarged lymph nodes. Reproductive: Obscured by artifact. Other: No ascites.  Abdominal wall is unremarkable. Musculoskeletal: Bilateral total hip arthroplasties with associated streak artifact. No acute osseous abnormality. IMPRESSION: Minor distention of some small bowel loops in the pelvis with air-fluid levels. May reflect a very low-grade obstruction. Electronically Signed   By: Macy Mis M.D.   On: 02/19/2021 15:51   DG Abd 1 View   Addendum Date: 02/20/2021   ADDENDUM REPORT: 02/20/2021 14:55 ADDENDUM: Sentence in the impression should read: No bowel obstruction or free air. Electronically Signed   By: Lowella Grip III M.D.   On: 02/20/2021 14:55   Result Date: 02/20/2021 CLINICAL DATA:  Abdominal pain EXAM: ABDOMEN - 1 VIEW COMPARISON:  February 19, 2021 CT abdomen and pelvis FINDINGS: There is moderate stool in the colon. There is no dilatation or air-fluid level to suggest obstruction. No free air. Mild scarring base. Bases otherwise. Patient is status post total replacements bilaterally. IMPRESSION: Bowel obstruction or free air.  Total hip replacements bilaterally. Electronically Signed: By: Lowella Grip III M.D. On: 02/20/2021 14:01   DG Abd Portable 1V-Small Bowel Obstruction Protocol-initial, 8 hr delay   Result Date: 02/21/2021 CLINICAL DATA:  Small-bowel obstruction.  Delayed imaging. EXAM: PORTABLE ABDOMEN - 1 VIEW COMPARISON:  02/20/2021.  CT 02/19/2021. FINDINGS: Oral contrast noted throughout the colon. No small bowel distention. No free air. No acute bony abnormality. Bilateral total hip replacements. IMPRESSION: Oral contrast noted throughout the colon. No small bowel distention. No free air. Electronically Signed   By: Marcello Moores  Register   On: 02/21/2021 06:22   Imaging Results      Subjective: Pt reporting that she is feeling  much better, had 3 bowel movements, tolerating diet well.  No abd pain.   Discharge Exam:     Vitals:    02/22/21 0542 02/22/21 0747  BP: (!) 148/89 (!) 139/104  Pulse: 64 (!) 59  Resp: 14 18  Temp: 98.2 F (36.8 C) 97.9 F (36.6 C)  SpO2: 99% 99%          Vitals:    02/21/21 1826 02/21/21 2043 02/22/21 0542 02/22/21 0747  BP: (!) 141/79 (!) 141/89 (!) 148/89 (!) 139/104  Pulse: 74 71 64 (!) 59  Resp: 18 15 14 18   Temp: 98.5 F (36.9 C) 98.6 F (37 C) 98.2 F (36.8 C) 97.9 F (36.6 C)  TempSrc: Oral Oral Oral Oral  SpO2: 98% 99% 99% 99%  Weight:          Height:            General exam: emaciated female, awake, alert, NAD.  Respiratory system: no increased work of breathing.  Cardiovascular system: normal s1, s2 sounds.   Gastrointestinal system: Abdomen: mild generalized tenderness. No guarding.  Central nervous system: Alert and oriented. No focal neurological deficits. Extremities:  No CCE.  Skin: No rashes, lesions or ulcers Psychiatry: Judgement and insight appear poor. Mood & affect appropriate.   The results of significant diagnostics from this hospitalization (including imaging, microbiology, ancillary and laboratory) are listed below for reference.      Microbiology:        Recent Results (from the past 240 hour(s))  Resp Panel by RT-PCR (Flu A&B, Covid) Nasopharyngeal Swab     Status: None    Collection Time: 02/19/21  4:16 PM    Specimen: Nasopharyngeal Swab; Nasopharyngeal(NP) swabs in vial transport medium  Result Value Ref Range  Status    SARS Coronavirus 2 by RT PCR NEGATIVE NEGATIVE Final      Comment: (NOTE) SARS-CoV-2 target nucleic acids are NOT DETECTED.   The SARS-CoV-2 RNA is generally detectable in upper respiratory specimens during the acute phase of infection. The lowest concentration of SARS-CoV-2 viral copies this assay can detect is 138 copies/mL. A negative result does not preclude SARS-Cov-2 infection and should not be used as the  sole basis for treatment or other patient management decisions. A negative result may occur with improper specimen collection/handling, submission of specimen other than nasopharyngeal swab, presence of viral mutation(s) within the areas targeted by this assay, and inadequate number of viral copies(<138 copies/mL). A negative result must be combined with clinical observations, patient history, and epidemiological information. The expected result is Negative.   Fact Sheet for Patients: EntrepreneurPulse.com.au   Fact Sheet for Healthcare Providers: IncredibleEmployment.be   This test is no t yet approved or cleared by the [place] FDA and has been authorized for detection and/or diagnosis of SARS-CoV-2 by FDA under an Emergency Use Authorization (EUA). This EUA will remain in effect (meaning this test can be used) for the duration of the COVID-19 declaration under Section 564(b)(1) of the Act, 21 U.S.C.section 360bbb-3(b)(1), unless the authorization is terminated or revoked sooner.          Influenza A by PCR NEGATIVE NEGATIVE Final    Influenza B by PCR NEGATIVE NEGATIVE Final      Comment: (NOTE) The Xpert Xpress SARS-CoV-2/FLU/RSV plus assay is intended as an aid in the diagnosis of influenza from Nasopharyngeal swab specimens and should not be used as a sole basis for treatment. Nasal washings and aspirates are unacceptable for Xpert Xpress SARS-CoV-2/FLU/RSV testing.   Fact Sheet for Patients: EntrepreneurPulse.com.au   Fact Sheet for Healthcare Providers: IncredibleEmployment.be   This test is not yet approved or cleared by the [place] FDA and has been authorized for detection and/or diagnosis of SARS-CoV-2 by FDA under an Emergency Use Authorization (EUA). This EUA will remain in effect (meaning this test can be used) for the duration of the COVID-19 declaration under Section 564(b)(1)  of the Act, 21 U.S.C. section 360bbb-3(b)(1), unless the authorization is terminated or revoked.   Performed at [facility], [address] [place], [place] [address]        Labs: BNP (last 3 results) Recent Labs (within last 365 days)  No results for input(s): BNP in the last 8760 hours.   Basic Metabolic Panel: Last Labs         Recent Labs  Lab 02/19/21 1254 02/19/21 1541 02/20/21 0817 02/21/21 0520  NA 133*  -- 139  139 140  K 3.6  -- 3.3*  3.3* 3.4*  CL 104  -- 110  110 113*  CO2 21*  -- 24  23 23   GLUCOSE 93  -- 92  94 110*  BUN 15  -- 8  7 5*  CREATININE 0.72  -- 0.64  0.59 0.57  CALCIUM 8.6*  -- 8.4*  8.4* 8.5*  MG  -- 2.1  -- 1.8  PHOS  -- 1.9*  -- 2.3*      Liver Function Tests: Last Labs        Recent Labs  Lab 02/19/21 1254 02/20/21 0817 02/21/21 0520  AST 26 25 21   ALT 17 15 14   ALKPHOS 68 56 52  BILITOT 0.7 0.6 0.5  PROT 7.7 6.3* 6.4*  ALBUMIN 3.9 3.2* 3.2*  Last Labs       Recent Labs  Lab 02/19/21 1254 02/20/21 0817  LIPASE 39 37      Last Labs   No results for input(s): AMMONIA in the last 168 hours.   CBC: Last Labs        Recent Labs  Lab 02/19/21 1254 02/20/21 0817 02/21/21 0520  WBC 9.0 8.5  8.5 8.2  NEUTROABS  -- 3.8 3.3  HGB 12.9 11.8*  11.9* 11.3*  HCT 39.8 35.5*  36.3 35.5*  MCV 92.3 91.0  91.2 93.2  PLT 290 283  301 291      Cardiac Enzymes: Last Labs   No results for input(s): CKTOTAL, CKMB, CKMBINDEX, TROPONINI in the last 168 hours.   BNP: Last Labs   Invalid input(s): POCBNP   CBG: Last Labs      Recent Labs  Lab 02/20/21 2036  GLUCAP 130*      D-Dimer Recent Labs (last 2 labs)   No results for input(s): DDIMER in the last 72 hours.   Hgb A1c Recent Labs (last 2 labs)   No results for input(s): HGBA1C in the last 72 hours.   Lipid Profile Recent Labs (last 2 labs)   No results for input(s): CHOL, HDL, LDLCALC, TRIG, CHOLHDL, LDLDIRECT in the last 72 hours.    Thyroid function studies  Recent Labs (last 2 labs)   No results for input(s): TSH, T4TOTAL, T3FREE, THYROIDAB in the last 72 hours.   Invalid input(s): FREET3   Anemia work up [employer] (last 2 labs)   No results for input(s): VITAMINB12, FOLATE, FERRITIN, TIBC, IRON, RETICCTPCT in the last 72 hours.   Urinalysis Labs (Brief)          Component Value Date/Time    COLORURINE COLORLESS (A) 02/19/2021 1211    APPEARANCEUR CLEAR 02/19/2021 1211    LABSPEC 1.005 02/19/2021 1211    PHURINE 6.0 02/19/2021 1211    GLUCOSEU NEGATIVE 02/19/2021 1211    HGBUR MODERATE (A) 02/19/2021 1211    BILIRUBINUR NEGATIVE 02/19/2021 1211    KETONESUR 5 (A) 02/19/2021 1211    PROTEINUR NEGATIVE 02/19/2021 1211    UROBILINOGEN 0.2 10/10/2014 1221    NITRITE NEGATIVE 02/19/2021 1211    LEUKOCYTESUR NEGATIVE 02/19/2021 1211      Sepsis Labs Last Labs   Invalid input(s): PROCALCITONIN,  WBC,  LACTICIDVEN   Microbiology        Recent Results (from the past 240 hour(s))  Resp Panel by RT-PCR (Flu A&B, Covid) Nasopharyngeal Swab     Status: None    Collection Time: 02/19/21  4:16 PM    Specimen: Nasopharyngeal Swab; Nasopharyngeal(NP) swabs in vial transport medium  Result Value Ref Range Status    SARS Coronavirus 2 by RT PCR NEGATIVE NEGATIVE Final      Comment: (NOTE) SARS-CoV-2 target nucleic acids are NOT DETECTED.   The SARS-CoV-2 RNA is generally detectable in upper respiratory specimens during the acute phase of infection. The lowest concentration of SARS-CoV-2 viral copies this assay can detect is 138 copies/mL. A negative result does not preclude SARS-Cov-2 infection and should not be used as the sole basis for treatment or other patient management decisions. A negative result may occur with improper specimen collection/handling, submission of specimen other than nasopharyngeal swab, presence of viral mutation(s) within the areas targeted by this assay, and inadequate number  of viral copies(<138 copies/mL). A negative result must be combined with clinical observations, patient history, and epidemiological information. The expected result  is Negative.   Fact Sheet for Patients: EntrepreneurPulse.com.au   Fact Sheet for Healthcare Providers: IncredibleEmployment.be   This test is no t yet approved or cleared by the [place] FDA and has been authorized for detection and/or diagnosis of SARS-CoV-2 by FDA under an Emergency Use Authorization (EUA). This EUA will remain in effect (meaning this test can be used) for the duration of the COVID-19 declaration under Section 564(b)(1) of the Act, 21 U.S.C.section 360bbb-3(b)(1), unless the authorization is terminated or revoked sooner.          Influenza A by PCR NEGATIVE NEGATIVE Final    Influenza B by PCR NEGATIVE NEGATIVE Final      Comment: (NOTE) The Xpert Xpress SARS-CoV-2/FLU/RSV plus assay is intended as an aid in the diagnosis of influenza from Nasopharyngeal swab specimens and should not be used as a sole basis for treatment. Nasal washings and aspirates are unacceptable for Xpert Xpress SARS-CoV-2/FLU/RSV testing.   Fact Sheet for Patients: EntrepreneurPulse.com.au   Fact Sheet for Healthcare Providers: IncredibleEmployment.be   This test is not yet approved or cleared by the [place] FDA and has been authorized for detection and/or diagnosis of SARS-CoV-2 by FDA under an Emergency Use Authorization (EUA). This EUA will remain in effect (meaning this test can be used) for the duration of the COVID-19 declaration under Section 564(b)(1) of the Act, 21 U.S.C. section 360bbb-3(b)(1), unless the authorization is terminated or revoked.   Performed at [facility], [address], [place], [place] [address]        Time coordinating discharge: 36 mins    SIGNED:   Irwin Brakeman, MD             [facility] 02/22/2021, 10:35 AM How to contact the [facility] Attending or Consulting provider [place] or covering provider during after hours [place], for this patient? Check the care team in [facility] and look for a) attending/consulting [facility] provider listed and b) the [facility] team listed Log into [email or web address] and use [facility]'s universal password to access. If you do not have the password, please contact the hospital operator. Locate the [facility] provider you are looking for under [facility] and page to a number that you can be directly reached. If you still have difficulty reaching the provider, please page the [facility] (Director on Call) for the Hospitalists listed on amion for assistance.

## 2021-03-01 DIAGNOSIS — K219 Gastro-esophageal reflux disease without esophagitis: Secondary | ICD-10-CM | POA: Diagnosis not present

## 2021-03-01 DIAGNOSIS — I10 Essential (primary) hypertension: Secondary | ICD-10-CM | POA: Diagnosis not present

## 2021-03-01 DIAGNOSIS — K56609 Unspecified intestinal obstruction, unspecified as to partial versus complete obstruction: Secondary | ICD-10-CM | POA: Diagnosis not present

## 2021-03-05 ENCOUNTER — Other Ambulatory Visit: Payer: Self-pay

## 2021-03-05 ENCOUNTER — Encounter (INDEPENDENT_AMBULATORY_CARE_PROVIDER_SITE_OTHER): Payer: Self-pay

## 2021-03-05 ENCOUNTER — Encounter (INDEPENDENT_AMBULATORY_CARE_PROVIDER_SITE_OTHER): Payer: Self-pay | Admitting: Gastroenterology

## 2021-03-05 ENCOUNTER — Ambulatory Visit (INDEPENDENT_AMBULATORY_CARE_PROVIDER_SITE_OTHER): Payer: Medicare Other | Admitting: Gastroenterology

## 2021-03-05 VITALS — BP 137/92 | HR 80 | Temp 97.9°F | Ht 65.0 in | Wt 118.0 lb

## 2021-03-05 DIAGNOSIS — K5649 Other impaction of intestine: Secondary | ICD-10-CM

## 2021-03-05 MED ORDER — DICYCLOMINE HCL 10 MG PO CAPS: 10.0000 mg | ORAL_CAPSULE | Freq: Two times a day (BID) | ORAL | 1 refills | Status: DC | PRN
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
## 2021-03-05 NOTE — Patient Instructions (Signed)
Schedule MR enterography Start Bentyl 1 tablet q12h as needed for abdominal pain Start taking Miralax 1 capful every day for one week. If bowel movements do not improve, increase to 1 capful every 12 hours. If after two weeks there is no improvement, increase to 1 capful every 8 hours Stop Lactulose

## 2021-03-12 ENCOUNTER — Telehealth (INDEPENDENT_AMBULATORY_CARE_PROVIDER_SITE_OTHER): Payer: Self-pay | Admitting: Gastroenterology

## 2021-03-12 ENCOUNTER — Other Ambulatory Visit (INDEPENDENT_AMBULATORY_CARE_PROVIDER_SITE_OTHER): Payer: Self-pay | Admitting: Gastroenterology

## 2021-03-12 DIAGNOSIS — K5649 Other impaction of intestine: Secondary | ICD-10-CM

## 2021-03-12 NOTE — Telephone Encounter (Signed)
Spoke with the patient who reported she developed new onset diarrhea since the last time she saw me in the clinic along with persistent abdominal pain.  She only took MiraLAX 1 day and has not taken any more since then since she has presented significant diarrhea.  I advised her to go to the ER to have some dedicated abdominal imaging acutely as her MRI will happen until next week.  Patient understood and agreed.

## 2021-03-12 NOTE — Telephone Encounter (Signed)
I spoke with the patient and she states she is still having diarrhea and it is yellow in color. She has been feeling nauseated no fever or blood in stools,but states she coughed up a small amount of  blood yesterday. She reports she is loosing weight with out trying and she is scared. She is not taking any stools softeners or laxatives. She is taking Bentyl and states it is of no help to her. Please advise.

## 2021-03-12 NOTE — Telephone Encounter (Signed)
Patient called the office stated she was seen last week - states she is still having abd pain - please advise - ph# [phone]

## 2021-03-21 ENCOUNTER — Other Ambulatory Visit (INDEPENDENT_AMBULATORY_CARE_PROVIDER_SITE_OTHER): Payer: Self-pay

## 2021-03-21 ENCOUNTER — Encounter (HOSPITAL_COMMUNITY): Payer: Self-pay

## 2021-03-21 ENCOUNTER — Ambulatory Visit (HOSPITAL_COMMUNITY)
Admission: RE | Admit: 2021-03-21 | Discharge: 2021-03-21 | Disposition: A | Discharge status: Home / Self Care | Payer: Medicare Other | Source: Ambulatory Visit | Attending: Gastroenterology | Admitting: Gastroenterology

## 2021-03-21 ENCOUNTER — Telehealth (INDEPENDENT_AMBULATORY_CARE_PROVIDER_SITE_OTHER): Payer: Self-pay

## 2021-03-21 DIAGNOSIS — R112 Nausea with vomiting, unspecified: Secondary | ICD-10-CM | POA: Diagnosis not present

## 2021-03-21 DIAGNOSIS — K852 Alcohol induced acute pancreatitis without necrosis or infection: Secondary | ICD-10-CM

## 2021-03-21 DIAGNOSIS — R1110 Vomiting, unspecified: Secondary | ICD-10-CM | POA: Diagnosis not present

## 2021-03-21 DIAGNOSIS — K5649 Other impaction of intestine: Secondary | ICD-10-CM | POA: Insufficient documentation

## 2021-03-21 DIAGNOSIS — K529 Noninfective gastroenteritis and colitis, unspecified: Secondary | ICD-10-CM

## 2021-03-21 DIAGNOSIS — K8689 Other specified diseases of pancreas: Secondary | ICD-10-CM

## 2021-03-21 MED ORDER — GLUCAGON HCL RDNA (DIAGNOSTIC) 1 MG IJ SOLR: 1.0000 mg | Freq: Once | INTRAMUSCULAR | Status: DC

## 2021-03-21 MED ORDER — GLUCAGON HCL RDNA (DIAGNOSTIC) 1 MG IJ SOLR
INTRAMUSCULAR | Status: AC
  Filled 2021-03-21: qty 1

## 2021-03-21 MED ORDER — IOHEXOL 300 MG/ML  SOLN
100.0000 mL | Freq: Once | INTRAMUSCULAR | Status: AC | PRN
  Administered 2021-03-21: 100 mL via INTRAVENOUS

## 2021-03-21 NOTE — Telephone Encounter (Signed)
Mary from MRI at [facility] [phone] called and states the patient is there today for a MRI and she is nauseous and vomiting. The radiologist was wanting to cancel the MRI and have a CT scan of Abd and pelvis with IV contrast done. Per Dr. Jenetta Downer the patient needs a CT Enterography,per Radiology the patient would have to drink for this and as the patient can not keep anything down this should not be done.  Dr. Jenetta Downer said the Ct scan of abd/pelvis with IV contrast would be ok,but if the patient is still sick and vomiting she will need to got to the Ed. Mary at Radiology is aware and she states the patient told them the nausea and vomiting was normal for her.

## 2021-03-21 NOTE — Telephone Encounter (Signed)
Thanks

## 2021-03-22 ENCOUNTER — Telehealth (INDEPENDENT_AMBULATORY_CARE_PROVIDER_SITE_OTHER): Payer: Self-pay

## 2021-03-22 ENCOUNTER — Encounter (INDEPENDENT_AMBULATORY_CARE_PROVIDER_SITE_OTHER): Payer: Self-pay

## 2021-03-22 ENCOUNTER — Other Ambulatory Visit (INDEPENDENT_AMBULATORY_CARE_PROVIDER_SITE_OTHER): Payer: Self-pay

## 2021-03-22 DIAGNOSIS — R112 Nausea with vomiting, unspecified: Secondary | ICD-10-CM

## 2021-03-22 DIAGNOSIS — K529 Noninfective gastroenteritis and colitis, unspecified: Secondary | ICD-10-CM

## 2021-03-22 MED ORDER — CLENPIQ 10-3.5-12 MG-GM -GM/160ML PO SOLN: 1.0000 | Freq: Once | ORAL | 0 refills | Status: AC

## 2021-03-22 NOTE — Telephone Encounter (Signed)
Sarah Ochoa, CMA  

## 2021-03-27 ENCOUNTER — Other Ambulatory Visit (INDEPENDENT_AMBULATORY_CARE_PROVIDER_SITE_OTHER): Payer: Self-pay

## 2021-03-28 ENCOUNTER — Other Ambulatory Visit (INDEPENDENT_AMBULATORY_CARE_PROVIDER_SITE_OTHER): Payer: Self-pay

## 2021-03-29 ENCOUNTER — Encounter (HOSPITAL_COMMUNITY): Payer: Self-pay | Admitting: Anesthesiology

## 2021-03-29 ENCOUNTER — Other Ambulatory Visit (INDEPENDENT_AMBULATORY_CARE_PROVIDER_SITE_OTHER): Payer: Self-pay | Admitting: Gastroenterology

## 2021-03-29 DIAGNOSIS — K5649 Other impaction of intestine: Secondary | ICD-10-CM

## 2021-03-30 ENCOUNTER — Ambulatory Visit (HOSPITAL_COMMUNITY): Admission: RE | Admit: 2021-03-30 | Payer: Medicare Other | Source: Ambulatory Visit | Admitting: Gastroenterology

## 2021-03-30 ENCOUNTER — Encounter (HOSPITAL_COMMUNITY): Admission: RE | Payer: Self-pay | Source: Ambulatory Visit

## 2021-03-30 SURGERY — COLONOSCOPY WITH PROPOFOL
Anesthesia: Monitor Anesthesia Care

## 2021-03-31 DIAGNOSIS — I10 Essential (primary) hypertension: Secondary | ICD-10-CM | POA: Diagnosis not present

## 2021-03-31 DIAGNOSIS — F17200 Nicotine dependence, unspecified, uncomplicated: Secondary | ICD-10-CM | POA: Diagnosis not present

## 2021-04-12 ENCOUNTER — Other Ambulatory Visit (INDEPENDENT_AMBULATORY_CARE_PROVIDER_SITE_OTHER): Payer: Self-pay | Admitting: Gastroenterology

## 2021-04-12 DIAGNOSIS — R079 Chest pain, unspecified: Secondary | ICD-10-CM | POA: Diagnosis not present

## 2021-04-12 DIAGNOSIS — R0789 Other chest pain: Secondary | ICD-10-CM | POA: Diagnosis not present

## 2021-04-12 DIAGNOSIS — K5649 Other impaction of intestine: Secondary | ICD-10-CM

## 2021-04-12 DIAGNOSIS — S29011A Strain of muscle and tendon of front wall of thorax, initial encounter: Secondary | ICD-10-CM | POA: Diagnosis not present

## 2021-04-12 NOTE — Telephone Encounter (Signed)
Last seen 03/05/2021 by Dr. Jenetta Downer

## 2021-05-01 DIAGNOSIS — J449 Chronic obstructive pulmonary disease, unspecified: Secondary | ICD-10-CM | POA: Diagnosis not present

## 2021-05-01 DIAGNOSIS — I10 Essential (primary) hypertension: Secondary | ICD-10-CM | POA: Diagnosis not present

## 2021-05-20 DIAGNOSIS — S8001XA Contusion of right knee, initial encounter: Secondary | ICD-10-CM | POA: Diagnosis not present

## 2021-05-20 DIAGNOSIS — F17210 Nicotine dependence, cigarettes, uncomplicated: Secondary | ICD-10-CM | POA: Diagnosis not present

## 2021-05-20 DIAGNOSIS — W228XXA Striking against or struck by other objects, initial encounter: Secondary | ICD-10-CM | POA: Diagnosis not present

## 2021-05-20 DIAGNOSIS — I10 Essential (primary) hypertension: Secondary | ICD-10-CM | POA: Diagnosis not present

## 2021-05-20 DIAGNOSIS — E785 Hyperlipidemia, unspecified: Secondary | ICD-10-CM | POA: Diagnosis not present

## 2021-05-20 DIAGNOSIS — J449 Chronic obstructive pulmonary disease, unspecified: Secondary | ICD-10-CM | POA: Diagnosis not present

## 2021-05-20 DIAGNOSIS — M899 Disorder of bone, unspecified: Secondary | ICD-10-CM | POA: Diagnosis not present

## 2021-05-20 DIAGNOSIS — M25561 Pain in right knee: Secondary | ICD-10-CM | POA: Diagnosis not present

## 2021-05-23 DIAGNOSIS — M25561 Pain in right knee: Secondary | ICD-10-CM | POA: Diagnosis not present

## 2021-06-01 DIAGNOSIS — M1990 Unspecified osteoarthritis, unspecified site: Secondary | ICD-10-CM | POA: Diagnosis not present

## 2021-06-01 DIAGNOSIS — I10 Essential (primary) hypertension: Secondary | ICD-10-CM | POA: Diagnosis not present

## 2021-06-07 ENCOUNTER — Telehealth: Payer: Self-pay | Admitting: Orthopaedic Surgery

## 2021-07-01 DIAGNOSIS — J449 Chronic obstructive pulmonary disease, unspecified: Secondary | ICD-10-CM | POA: Diagnosis not present

## 2021-07-01 DIAGNOSIS — I10 Essential (primary) hypertension: Secondary | ICD-10-CM | POA: Diagnosis not present

## 2021-08-01 DIAGNOSIS — I10 Essential (primary) hypertension: Secondary | ICD-10-CM | POA: Diagnosis not present

## 2021-08-01 DIAGNOSIS — M1990 Unspecified osteoarthritis, unspecified site: Secondary | ICD-10-CM | POA: Diagnosis not present

## 2021-09-12 DIAGNOSIS — J209 Acute bronchitis, unspecified: Secondary | ICD-10-CM | POA: Diagnosis not present

## 2021-09-12 DIAGNOSIS — M791 Myalgia, unspecified site: Secondary | ICD-10-CM | POA: Diagnosis not present

## 2021-09-12 DIAGNOSIS — Z20822 Contact with and (suspected) exposure to covid-19: Secondary | ICD-10-CM | POA: Diagnosis not present

## 2021-09-29 DIAGNOSIS — K5730 Diverticulosis of large intestine without perforation or abscess without bleeding: Secondary | ICD-10-CM | POA: Diagnosis not present

## 2021-09-29 DIAGNOSIS — K2900 Acute gastritis without bleeding: Secondary | ICD-10-CM | POA: Diagnosis not present

## 2021-09-29 DIAGNOSIS — Z885 Allergy status to narcotic agent status: Secondary | ICD-10-CM | POA: Diagnosis not present

## 2021-09-29 DIAGNOSIS — Z20822 Contact with and (suspected) exposure to covid-19: Secondary | ICD-10-CM | POA: Diagnosis not present

## 2021-09-29 DIAGNOSIS — Z7982 Long term (current) use of aspirin: Secondary | ICD-10-CM | POA: Diagnosis not present

## 2021-09-29 DIAGNOSIS — I10 Essential (primary) hypertension: Secondary | ICD-10-CM | POA: Diagnosis not present

## 2021-09-29 DIAGNOSIS — Z882 Allergy status to sulfonamides status: Secondary | ICD-10-CM | POA: Diagnosis not present

## 2021-09-29 DIAGNOSIS — I700 Atherosclerosis of aorta: Secondary | ICD-10-CM | POA: Diagnosis not present

## 2021-09-29 DIAGNOSIS — J449 Chronic obstructive pulmonary disease, unspecified: Secondary | ICD-10-CM | POA: Diagnosis not present

## 2021-09-29 DIAGNOSIS — F17210 Nicotine dependence, cigarettes, uncomplicated: Secondary | ICD-10-CM | POA: Diagnosis not present

## 2021-10-21 DIAGNOSIS — Z720 Tobacco use: Secondary | ICD-10-CM | POA: Diagnosis not present

## 2021-10-21 DIAGNOSIS — E785 Hyperlipidemia, unspecified: Secondary | ICD-10-CM | POA: Diagnosis not present

## 2021-10-21 DIAGNOSIS — Z885 Allergy status to narcotic agent status: Secondary | ICD-10-CM | POA: Diagnosis not present

## 2021-10-21 DIAGNOSIS — Z79899 Other long term (current) drug therapy: Secondary | ICD-10-CM | POA: Diagnosis not present

## 2021-10-21 DIAGNOSIS — I10 Essential (primary) hypertension: Secondary | ICD-10-CM | POA: Diagnosis not present

## 2021-10-21 DIAGNOSIS — Z882 Allergy status to sulfonamides status: Secondary | ICD-10-CM | POA: Diagnosis not present

## 2021-10-21 DIAGNOSIS — T464X5A Adverse effect of angiotensin-converting-enzyme inhibitors, initial encounter: Secondary | ICD-10-CM | POA: Diagnosis not present

## 2021-10-21 DIAGNOSIS — K219 Gastro-esophageal reflux disease without esophagitis: Secondary | ICD-10-CM | POA: Diagnosis not present

## 2021-10-21 DIAGNOSIS — T783XXA Angioneurotic edema, initial encounter: Secondary | ICD-10-CM | POA: Diagnosis not present

## 2021-10-21 DIAGNOSIS — F17210 Nicotine dependence, cigarettes, uncomplicated: Secondary | ICD-10-CM | POA: Diagnosis not present

## 2021-10-21 DIAGNOSIS — J449 Chronic obstructive pulmonary disease, unspecified: Secondary | ICD-10-CM | POA: Diagnosis not present

## 2021-10-25 DIAGNOSIS — I10 Essential (primary) hypertension: Secondary | ICD-10-CM | POA: Diagnosis not present

## 2021-10-25 DIAGNOSIS — J449 Chronic obstructive pulmonary disease, unspecified: Secondary | ICD-10-CM | POA: Diagnosis not present

## 2021-10-25 DIAGNOSIS — K219 Gastro-esophageal reflux disease without esophagitis: Secondary | ICD-10-CM | POA: Diagnosis not present

## 2021-10-29 DIAGNOSIS — Z0001 Encounter for general adult medical examination with abnormal findings: Secondary | ICD-10-CM | POA: Diagnosis not present

## 2021-11-01 DIAGNOSIS — I10 Essential (primary) hypertension: Secondary | ICD-10-CM | POA: Diagnosis not present

## 2021-11-01 DIAGNOSIS — R519 Headache, unspecified: Secondary | ICD-10-CM | POA: Diagnosis not present

## 2021-11-04 ENCOUNTER — Telehealth: Payer: Self-pay | Admitting: Orthopaedic Surgery

## 2021-11-12 DIAGNOSIS — F17210 Nicotine dependence, cigarettes, uncomplicated: Secondary | ICD-10-CM | POA: Diagnosis not present

## 2021-11-12 DIAGNOSIS — I10 Essential (primary) hypertension: Secondary | ICD-10-CM | POA: Diagnosis not present

## 2021-11-12 DIAGNOSIS — Z0001 Encounter for general adult medical examination with abnormal findings: Secondary | ICD-10-CM | POA: Diagnosis not present

## 2021-11-12 DIAGNOSIS — Z1389 Encounter for screening for other disorder: Secondary | ICD-10-CM | POA: Diagnosis not present

## 2021-11-12 DIAGNOSIS — J449 Chronic obstructive pulmonary disease, unspecified: Secondary | ICD-10-CM | POA: Diagnosis not present

## 2021-11-13 ENCOUNTER — Other Ambulatory Visit (HOSPITAL_COMMUNITY): Payer: Self-pay | Admitting: Gerontology

## 2021-11-13 ENCOUNTER — Other Ambulatory Visit: Payer: Self-pay | Admitting: Gerontology

## 2021-11-13 DIAGNOSIS — M79661 Pain in right lower leg: Secondary | ICD-10-CM

## 2021-11-26 ENCOUNTER — Ambulatory Visit (HOSPITAL_COMMUNITY)
Admission: RE | Admit: 2021-11-26 | Discharge: 2021-11-26 | Disposition: A | Discharge status: Home / Self Care | Payer: Medicare Other | Source: Ambulatory Visit | Attending: Gerontology | Admitting: Gerontology

## 2021-11-26 ENCOUNTER — Other Ambulatory Visit: Payer: Self-pay

## 2021-11-26 DIAGNOSIS — J449 Chronic obstructive pulmonary disease, unspecified: Secondary | ICD-10-CM | POA: Diagnosis not present

## 2021-11-26 DIAGNOSIS — M79605 Pain in left leg: Secondary | ICD-10-CM | POA: Diagnosis not present

## 2021-11-26 DIAGNOSIS — M79604 Pain in right leg: Secondary | ICD-10-CM | POA: Diagnosis not present

## 2021-11-26 DIAGNOSIS — M79661 Pain in right lower leg: Secondary | ICD-10-CM

## 2021-11-26 DIAGNOSIS — E782 Mixed hyperlipidemia: Secondary | ICD-10-CM | POA: Diagnosis not present

## 2021-11-26 DIAGNOSIS — I10 Essential (primary) hypertension: Secondary | ICD-10-CM | POA: Diagnosis not present

## 2021-12-07 ENCOUNTER — Ambulatory Visit: Payer: Medicare Other | Admitting: Obstetrics & Gynecology

## 2021-12-27 DIAGNOSIS — I10 Essential (primary) hypertension: Secondary | ICD-10-CM | POA: Diagnosis not present

## 2021-12-27 DIAGNOSIS — M1990 Unspecified osteoarthritis, unspecified site: Secondary | ICD-10-CM | POA: Diagnosis not present

## 2022-01-15 DIAGNOSIS — I10 Essential (primary) hypertension: Secondary | ICD-10-CM | POA: Diagnosis not present

## 2022-01-15 DIAGNOSIS — L259 Unspecified contact dermatitis, unspecified cause: Secondary | ICD-10-CM | POA: Diagnosis not present

## 2022-01-16 DIAGNOSIS — I10 Essential (primary) hypertension: Secondary | ICD-10-CM | POA: Diagnosis not present

## 2022-01-16 DIAGNOSIS — K219 Gastro-esophageal reflux disease without esophagitis: Secondary | ICD-10-CM | POA: Diagnosis not present

## 2022-01-16 DIAGNOSIS — N390 Urinary tract infection, site not specified: Secondary | ICD-10-CM | POA: Diagnosis not present

## 2022-01-17 ENCOUNTER — Ambulatory Visit (HOSPITAL_COMMUNITY)
Admission: RE | Admit: 2022-01-17 | Discharge: 2022-01-17 | Disposition: A | Discharge status: Home / Self Care | Payer: Medicare Other | Source: Ambulatory Visit | Attending: Gerontology | Admitting: Gerontology

## 2022-01-17 ENCOUNTER — Other Ambulatory Visit (HOSPITAL_COMMUNITY): Payer: Self-pay | Admitting: Gerontology

## 2022-01-17 DIAGNOSIS — M545 Low back pain, unspecified: Secondary | ICD-10-CM | POA: Diagnosis not present

## 2022-01-17 DIAGNOSIS — M25551 Pain in right hip: Secondary | ICD-10-CM | POA: Diagnosis not present

## 2022-01-24 ENCOUNTER — Other Ambulatory Visit: Payer: Medicare Other | Admitting: Adult Health

## 2022-01-29 ENCOUNTER — Ambulatory Visit: Payer: Medicare Other | Admitting: Adult Health

## 2022-01-31 ENCOUNTER — Ambulatory Visit (INDEPENDENT_AMBULATORY_CARE_PROVIDER_SITE_OTHER): Payer: Medicare Other | Admitting: Adult Health

## 2022-01-31 ENCOUNTER — Encounter: Payer: Self-pay | Admitting: Adult Health

## 2022-01-31 VITALS — BP 127/84 | HR 70 | Ht 65.0 in | Wt 115.5 lb

## 2022-01-31 DIAGNOSIS — N898 Other specified noninflammatory disorders of vagina: Secondary | ICD-10-CM

## 2022-01-31 DIAGNOSIS — L0292 Furuncle, unspecified: Secondary | ICD-10-CM

## 2022-01-31 MED ORDER — FLUCONAZOLE 150 MG PO TABS: ORAL_TABLET | ORAL | 1 refills | Status: DC

## 2022-01-31 MED ORDER — DOXYCYCLINE HYCLATE 100 MG PO TABS: 100.0000 mg | ORAL_TABLET | Freq: Two times a day (BID) | ORAL | 0 refills | Status: DC

## 2022-01-31 NOTE — Progress Notes (Signed)
  Subjective:     Patient ID: Sarah Ochoa, female   DOB: [birth year], 61 y.o.   MRN: [id number]  HPI [name] is a 61 year old black female, engaged, sp hysterectomy in complaining of knot in vaginal area, but it is smaller and has some itching. PCP is Dr Legrand Rams  Review of Systems ?knot in vaginal area feels smaller Vaginal itching Reviewed past medical,surgical, social and family history. Reviewed medications and allergies.     Objective:   Physical Exam BP 127/84 (BP Location: Left Arm, Patient Position: Sitting, Cuff Size: Normal)   Pulse 70   Ht '5\' 5"'$  (1.651 m)   Wt 115 lb 8 oz (52.4 kg)   BMI 19.22 kg/m     Skin warm and dry.Pelvic: external genitalia is normal in appearance, has pea sized knot under the skin that is tender, no redness, vagina: white discharge without odor,urethra has no lesions or masses noted, cervix and uterus absent, adnexa: no masses or tenderness noted. Bladder is non tender and no masses felt.  Fall risk is low  [facility] - 01/31/22 1117       Pregnancy Intention Screening   Does the patient want to become pregnant in the next year? N/A    Does the patient's partner want to become pregnant in the next year? N/A    Would the patient like to discuss contraceptive options today? N/A      Contraception Wrap Up   Current Method Female Sterilization   hyst   End Method Female Sterilization   hyst   Contraception Counseling Provided No            Examination chaperoned by Levy Pupa LPN  Assessment:     1. Boil Will rx doxycyline Use warm compress, no not squeeze Review handout on skin abscess  Meds ordered this encounter  Medications   doxycycline (VIBRA-TABS) 100 MG tablet    Sig: Take 1 tablet (100 mg total) by mouth 2 (two) times daily.    Dispense:  14 tablet    Refill:  0    Order Specific Question:   Supervising Provider    Answer:   Elonda Husky, LUTHER H [[id number]]   fluconazole (DIFLUCAN) 150 MG tablet    Sig: Take 1 now and 1 in 3 days     Dispense:  2 tablet    Refill:  1    Order Specific Question:   Supervising Provider    Answer:   Elonda Husky, LUTHER H [[id number]]     2. Itching in the vaginal area Will rx diflucan     Plan:     Has physical 03/04/22

## 2022-02-02 ENCOUNTER — Other Ambulatory Visit: Payer: Self-pay | Admitting: Orthopaedic Surgery

## 2022-02-07 ENCOUNTER — Encounter: Payer: Self-pay | Admitting: Orthopaedic Surgery

## 2022-02-15 DIAGNOSIS — J449 Chronic obstructive pulmonary disease, unspecified: Secondary | ICD-10-CM | POA: Diagnosis not present

## 2022-02-15 DIAGNOSIS — I10 Essential (primary) hypertension: Secondary | ICD-10-CM | POA: Diagnosis not present

## 2022-03-04 ENCOUNTER — Encounter: Payer: Self-pay | Admitting: Adult Health

## 2022-03-04 ENCOUNTER — Ambulatory Visit (INDEPENDENT_AMBULATORY_CARE_PROVIDER_SITE_OTHER): Payer: Medicare Other | Admitting: Adult Health

## 2022-03-04 VITALS — BP 126/70 | HR 87 | Ht 65.0 in | Wt 121.0 lb

## 2022-03-04 DIAGNOSIS — Z1211 Encounter for screening for malignant neoplasm of colon: Secondary | ICD-10-CM

## 2022-03-04 DIAGNOSIS — Z01419 Encounter for gynecological examination (general) (routine) without abnormal findings: Secondary | ICD-10-CM

## 2022-03-04 DIAGNOSIS — Z90710 Acquired absence of both cervix and uterus: Secondary | ICD-10-CM | POA: Diagnosis not present

## 2022-03-04 DIAGNOSIS — Z1231 Encounter for screening mammogram for malignant neoplasm of breast: Secondary | ICD-10-CM | POA: Diagnosis not present

## 2022-03-04 DIAGNOSIS — Z79899 Other long term (current) drug therapy: Secondary | ICD-10-CM | POA: Diagnosis not present

## 2022-03-04 LAB — HEMOCCULT GUIAC POC 1CARD (OFFICE): Fecal Occult Blood, POC: NEGATIVE

## 2022-03-04 MED ORDER — ESTRADIOL 2 MG PO TABS: 2.0000 mg | ORAL_TABLET | Freq: Every day | ORAL | 4 refills | Status: DC

## 2022-03-04 NOTE — Progress Notes (Signed)
Patient ID: Sarah Ochoa, female   DOB: 12-21-1960, 61 y.o.   MRN: [id number] History of Present Illness: Sarah Ochoa is a 61 year old black female, divorced, sp hysterectomy in for a well woman gyn exa, The boil and itching has resolved. PCP is Dr Legrand Rams.   Current Medications, Allergies, Past Medical History, Past Surgical History, Family History and Social History were reviewed in [employer] record.     Review of Systems: Patient denies any headaches, hearing loss, fatigue, blurred vision, shortness of breath, chest pain, abdominal pain, problems with bowel movements, urination, or intercourse. No joint pain or mood swings.  She has asthma takes 4 treatments daily, and they make her nervous, will talk with Dr Legrand Rams   Physical Exam:BP 126/70 (BP Location: Right Arm, Patient Position: Sitting, Cuff Size: Normal)   Pulse 87   Ht '5\' 5"'$  (1.651 m)   Wt 121 lb (54.9 kg)   BMI 20.14 kg/m   General:  Well developed, well nourished, no acute distress Skin:  Warm and dry Neck:  Midline trachea, normal thyroid, good ROM, no lymphadenopathy Lungs; Clear to auscultation bilaterally Breast:  No dominant palpable mass, retraction, or nipple discharge Cardiovascular: Regular rate and rhythm Abdomen:  Soft, non tender, no hepatosplenomegaly Pelvic:  External genitalia is normal in appearance, no lesions.  The vagina is normal in appearance. Urethra has no lesions or masses. The cervix and uterus area absent. No adnexal masses or tenderness noted.Bladder is non tender, no masses felt. Rectal: Good sphincter tone, no polyps, or hemorrhoids felt.  Hemoccult negative. Extremities/musculoskeletal:  No swelling or varicosities noted, no clubbing or cyanosis Psych:  No mood changes, alert and cooperative,seems happy AA is 0 Fall risk is low    03/04/2022    3:48 PM 01/16/2021    8:39 AM 11/08/2019   10:05 AM  Depression screen PHQ 2/9  Decreased Interest 0 1 1  Down, Depressed,  Hopeless 0 1 1  PHQ - 2 Score 0 2 2  Altered sleeping '3 3 1  '$ Tired, decreased energy '3 1 1  '$ Change in appetite 0 0 0  Feeling bad or failure about yourself  0 0 0  Trouble concentrating 0 0 1  Moving slowly or fidgety/restless 0 0 0  Suicidal thoughts 0 0 0  PHQ-9 Score '6 6 5  '$ Difficult doing work/chores   Somewhat difficult   On zoloft    03/04/2022    3:48 PM 01/16/2021    8:39 AM  GAD 7 : Generalized Anxiety Score  Nervous, Anxious, on Edge 1 2  Control/stop worrying 1 0  Worry too much - different things 1 2  Trouble relaxing 1 2  Restless 0 0  Easily annoyed or irritable 1 2  Afraid - awful might happen 2 0  Total GAD 7 Score 7 8      [facility] - 03/04/22 1537       Pregnancy Intention Screening   Does the patient want to become pregnant in the next year? No    Does the patient's partner want to become pregnant in the next year? No    Would the patient like to discuss contraceptive options today? No      Contraception Wrap Up   Current Method Female Sterilization   hyst   End Method Female Sterilization   hyst   Contraception Counseling Provided No            Examination chaperoned by Celene Squibb LPN  Impression and  Plan:  1. Encounter for well woman exam with routine gynecological exam Physical in 1 year Labs with PCP  2. Encounter for screening fecal occult blood testing Hemoccult was negative  3. Current use of estrogen therapy Will refill Estrace 2 mg 1 daily Meds ordered this encounter  Medications   estradiol (ESTRACE) 2 MG tablet    Sig: Take 1 tablet (2 mg total) by mouth daily.    Dispense:  90 tablet    Refill:  4    Order Specific Question:   Supervising Provider    Answer:   Elonda Husky, LUTHER H [[id number]]     4. S/P hysterectomy  5. Screening mammogram for breast cancer Scheduled screening mammogram for her 03/11/22 at [facility] at 4:30 pm

## 2022-03-06 ENCOUNTER — Ambulatory Visit (INDEPENDENT_AMBULATORY_CARE_PROVIDER_SITE_OTHER): Payer: Medicare Other | Admitting: Orthopaedic Surgery

## 2022-03-06 ENCOUNTER — Encounter: Payer: Self-pay | Admitting: Orthopaedic Surgery

## 2022-03-06 VITALS — Ht 65.0 in | Wt 118.1 lb

## 2022-03-06 DIAGNOSIS — M25551 Pain in right hip: Secondary | ICD-10-CM

## 2022-03-06 DIAGNOSIS — Z96641 Presence of right artificial hip joint: Secondary | ICD-10-CM
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
## 2022-03-06 NOTE — Patient Instructions (Signed)
Smoking Tobacco Information, Adult Smoking tobacco can be harmful to your health. Tobacco contains a toxic colorless chemical called nicotine. Nicotine causes changes in your brain that make you want more and more. This is called addiction. This can make it hard to stop smoking once you start. Tobacco also has other toxic chemicals that can hurt your body and raise your risk of many cancers. Menthol or "lite" tobacco or cigarette brands are not safer than regular brands. How can smoking tobacco affect me? Smoking tobacco puts you at risk for: Cancer. Smoking is most commonly associated with lung cancer, but can also lead to cancer in other parts of the body. Chronic obstructive pulmonary disease (COPD). This is a long-term lung condition that makes it hard to breathe. It also gets worse over time. High blood pressure (hypertension), heart disease, stroke, heart attack, and lung infections, such as pneumonia. Cataracts. This is when the lenses in the eyes become clouded. Digestive problems. This may include peptic ulcers, heartburn, and gastroesophageal reflux disease (GERD). Oral health problems, such as gum disease, mouth sores, and tooth loss. Loss of taste and smell. Smoking also affects how you look and smell. Smoking may cause: Wrinkles. Yellow or stained teeth, fingers, and fingernails. Bad breath. Bad-smelling clothes and hair. Smoking tobacco can also affect your social life, because: It may be challenging to find places to smoke when away from home. Many workplaces, restaurants, hotels, and public places are tobacco-free. Smoking is expensive. This is due to the cost of tobacco and the long-term costs of treating health problems from smoking. Secondhand smoke may affect those around you. Secondhand smoke can cause lung cancer, breathing problems, and heart disease. Children of smokers have a higher risk for: Sudden infant death syndrome (SIDS). Ear infections. Lung infections. What  actions can I take to prevent health problems? Quit smoking  Do not start smoking. Quit if you already smoke. Do not replace cigarette smoking with vaping devices, such as e-cigarettes. Make a plan to quit smoking and commit to it. Look for programs to help you, and ask your health care provider for recommendations and ideas. Set a date and write down all the reasons you want to quit. Let your friends and family know you are quitting so they can help and support you. Consider finding friends who also want to quit. It can be easier to quit with someone else, so that you can support each other. Talk with your health care provider about using nicotine replacement medicines to help you quit. These include gum, lozenges, patches, sprays, or pills. If you try to quit but return to smoking, stay positive. It is common to slip up when you first quit, so take it one day at a time. Be prepared for cravings. When you feel the urge to smoke, chew gum or suck on hard candy. Lifestyle Stay busy. Take care of your body. Get plenty of exercise, eat a healthy diet, and drink plenty of water. Find ways to manage your stress, such as meditation, yoga, exercise, or time spent with friends and family. Ask your health care provider about having regular tests (screenings) to check for cancer. This may include blood tests, imaging tests, and other tests. Where to find support To get support to quit smoking, consider: Asking your health care provider for more information and resources. Joining a support group for people who want to quit smoking in your local community. There are many effective programs that may help you to quit. Calling the smokefree.gov counselor   helpline at 1-800-QUIT-NOW (1-800-784-8669). Where to find more information You may find more information about quitting smoking from: Centers for Disease Control and Prevention: cdc.gov/tobacco Smokefree.gov: smokefree.gov American Lung Association:  freedomfromsmoking.org Contact a health care provider if: You have problems breathing. Your lips, nose, or fingers turn blue. You have chest pain. You are coughing up blood. You feel like you will faint. You have other health changes that cause you to worry. Summary Smoking tobacco can negatively affect your health, the health of those around you, your finances, and your social life. Do not start smoking. Quit if you already smoke. If you need help quitting, ask your health care provider. Consider joining a support group for people in your local community who want to quit smoking. There are many effective programs that may help you to quit. This information is not intended to replace advice given to you by your health care provider. Make sure you discuss any questions you have with your health care provider. Document Revised: 08/28/2021 Document Reviewed: 08/28/2021 Elsevier Patient Education  2023 Elsevier Inc.  

## 2022-03-11 ENCOUNTER — Ambulatory Visit (HOSPITAL_COMMUNITY)
Admission: RE | Admit: 2022-03-11 | Discharge: 2022-03-11 | Disposition: A | Discharge status: Home / Self Care | Payer: Medicare Other | Source: Ambulatory Visit | Attending: Adult Health | Admitting: Adult Health

## 2022-03-11 DIAGNOSIS — Z1231 Encounter for screening mammogram for malignant neoplasm of breast: Secondary | ICD-10-CM | POA: Insufficient documentation

## 2022-03-13 ENCOUNTER — Ambulatory Visit (INDEPENDENT_AMBULATORY_CARE_PROVIDER_SITE_OTHER): Payer: Medicare Other | Admitting: Orthopaedic Surgery

## 2022-03-13 ENCOUNTER — Encounter: Payer: Self-pay | Admitting: Orthopaedic Surgery

## 2022-03-13 DIAGNOSIS — M25551 Pain in right hip: Secondary | ICD-10-CM | POA: Diagnosis not present

## 2022-03-13 DIAGNOSIS — Z96643 Presence of artificial hip joint, bilateral: Secondary | ICD-10-CM | POA: Diagnosis not present

## 2022-03-13 NOTE — Progress Notes (Signed)
The patient is a 61 year old female that I have not seen in a long period of time.  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  These were done back in 2016.  Last few months she has had some pain in her right hip but she points to IT band and the trochanteric area as a source of her pain.  Is not been a deep pain and she denies any pain today.  She is walking without a limp and says it really comes on and off during weather changes.  She has had x-rays that I have reviewed of her pelvis and right hip.  These were compared to previous x-rays over time.  There is sclerotic changes around the proximal femur at the shoulder area but this is been unchanged and there is plenty of areas that she can see on the AP and lateral views showing that the stem is bone ingrown.  This has not changed any at all over the last 4 to 5 years.  Again there is no pain on exam today in her office I can easily put her hip through internal extra rotation on both sides.  She takes naproxen as needed for pain and inflammation.  Her leg lengths are equal.  All question concerns were answered addressed.  Follow-up is as needed.

## 2022-03-17 DIAGNOSIS — I10 Essential (primary) hypertension: Secondary | ICD-10-CM | POA: Diagnosis not present

## 2022-03-17 DIAGNOSIS — K219 Gastro-esophageal reflux disease without esophagitis: Secondary | ICD-10-CM | POA: Diagnosis not present

## 2022-05-30 DIAGNOSIS — H538 Other visual disturbances: Secondary | ICD-10-CM | POA: Diagnosis not present

## 2022-05-30 DIAGNOSIS — R52 Pain, unspecified: Secondary | ICD-10-CM | POA: Diagnosis not present

## 2022-05-30 DIAGNOSIS — R0602 Shortness of breath: Secondary | ICD-10-CM | POA: Diagnosis not present

## 2022-05-30 DIAGNOSIS — I10 Essential (primary) hypertension: Secondary | ICD-10-CM | POA: Diagnosis not present

## 2022-05-30 DIAGNOSIS — J3489 Other specified disorders of nose and nasal sinuses: Secondary | ICD-10-CM | POA: Diagnosis not present

## 2022-05-30 DIAGNOSIS — Z7982 Long term (current) use of aspirin: Secondary | ICD-10-CM | POA: Diagnosis not present

## 2022-05-30 DIAGNOSIS — M316 Other giant cell arteritis: Secondary | ICD-10-CM | POA: Insufficient documentation

## 2022-05-30 DIAGNOSIS — E860 Dehydration: Secondary | ICD-10-CM | POA: Diagnosis not present

## 2022-05-30 DIAGNOSIS — Z79899 Other long term (current) drug therapy: Secondary | ICD-10-CM | POA: Diagnosis not present

## 2022-05-30 DIAGNOSIS — F17210 Nicotine dependence, cigarettes, uncomplicated: Secondary | ICD-10-CM | POA: Diagnosis not present

## 2022-05-30 DIAGNOSIS — R519 Headache, unspecified: Secondary | ICD-10-CM | POA: Diagnosis not present

## 2022-05-30 DIAGNOSIS — J449 Chronic obstructive pulmonary disease, unspecified: Secondary | ICD-10-CM | POA: Diagnosis not present

## 2022-05-30 DIAGNOSIS — E876 Hypokalemia: Secondary | ICD-10-CM | POA: Diagnosis not present

## 2022-05-30 DIAGNOSIS — Z792 Long term (current) use of antibiotics: Secondary | ICD-10-CM | POA: Diagnosis not present

## 2022-05-30 DIAGNOSIS — J45909 Unspecified asthma, uncomplicated: Secondary | ICD-10-CM | POA: Insufficient documentation

## 2022-05-30 DIAGNOSIS — Z885 Allergy status to narcotic agent status: Secondary | ICD-10-CM | POA: Diagnosis not present

## 2022-05-30 DIAGNOSIS — H539 Unspecified visual disturbance: Secondary | ICD-10-CM | POA: Diagnosis not present

## 2022-05-30 DIAGNOSIS — U071 COVID-19: Secondary | ICD-10-CM | POA: Diagnosis not present

## 2022-05-30 DIAGNOSIS — J019 Acute sinusitis, unspecified: Secondary | ICD-10-CM | POA: Diagnosis not present

## 2022-05-30 DIAGNOSIS — Z743 Need for continuous supervision: Secondary | ICD-10-CM | POA: Diagnosis not present

## 2022-05-30 DIAGNOSIS — K219 Gastro-esophageal reflux disease without esophagitis: Secondary | ICD-10-CM | POA: Diagnosis not present

## 2022-05-30 DIAGNOSIS — I6529 Occlusion and stenosis of unspecified carotid artery: Secondary | ICD-10-CM | POA: Diagnosis not present

## 2022-05-30 DIAGNOSIS — Z7952 Long term (current) use of systemic steroids: Secondary | ICD-10-CM | POA: Diagnosis not present

## 2022-05-30 DIAGNOSIS — G441 Vascular headache, not elsewhere classified: Secondary | ICD-10-CM | POA: Diagnosis not present

## 2022-05-30 DIAGNOSIS — R0789 Other chest pain: Secondary | ICD-10-CM | POA: Diagnosis not present

## 2022-05-30 DIAGNOSIS — R110 Nausea: Secondary | ICD-10-CM | POA: Diagnosis not present

## 2022-05-30 DIAGNOSIS — R059 Cough, unspecified: Secondary | ICD-10-CM | POA: Diagnosis not present

## 2022-05-30 DIAGNOSIS — E7800 Pure hypercholesterolemia, unspecified: Secondary | ICD-10-CM | POA: Diagnosis not present

## 2022-05-30 DIAGNOSIS — Z882 Allergy status to sulfonamides status: Secondary | ICD-10-CM | POA: Diagnosis not present

## 2022-05-30 DIAGNOSIS — I6523 Occlusion and stenosis of bilateral carotid arteries: Secondary | ICD-10-CM | POA: Diagnosis not present

## 2022-05-30 DIAGNOSIS — R700 Elevated erythrocyte sedimentation rate: Secondary | ICD-10-CM | POA: Insufficient documentation

## 2022-05-31 DIAGNOSIS — K219 Gastro-esophageal reflux disease without esophagitis: Secondary | ICD-10-CM | POA: Diagnosis not present

## 2022-05-31 DIAGNOSIS — Z7952 Long term (current) use of systemic steroids: Secondary | ICD-10-CM | POA: Diagnosis not present

## 2022-05-31 DIAGNOSIS — E876 Hypokalemia: Secondary | ICD-10-CM | POA: Diagnosis not present

## 2022-05-31 DIAGNOSIS — Z79899 Other long term (current) drug therapy: Secondary | ICD-10-CM | POA: Diagnosis not present

## 2022-05-31 DIAGNOSIS — I10 Essential (primary) hypertension: Secondary | ICD-10-CM | POA: Diagnosis not present

## 2022-05-31 DIAGNOSIS — U071 COVID-19: Secondary | ICD-10-CM | POA: Diagnosis not present

## 2022-05-31 DIAGNOSIS — J449 Chronic obstructive pulmonary disease, unspecified: Secondary | ICD-10-CM | POA: Diagnosis not present

## 2022-05-31 DIAGNOSIS — H539 Unspecified visual disturbance: Secondary | ICD-10-CM | POA: Diagnosis not present

## 2022-05-31 DIAGNOSIS — R519 Headache, unspecified: Secondary | ICD-10-CM | POA: Diagnosis not present

## 2022-05-31 DIAGNOSIS — G441 Vascular headache, not elsewhere classified: Secondary | ICD-10-CM | POA: Diagnosis not present

## 2022-05-31 DIAGNOSIS — M316 Other giant cell arteritis: Secondary | ICD-10-CM | POA: Diagnosis not present

## 2022-06-01 DIAGNOSIS — U071 COVID-19: Secondary | ICD-10-CM | POA: Diagnosis not present

## 2022-06-01 DIAGNOSIS — Z792 Long term (current) use of antibiotics: Secondary | ICD-10-CM | POA: Diagnosis not present

## 2022-06-01 DIAGNOSIS — E876 Hypokalemia: Secondary | ICD-10-CM | POA: Diagnosis not present

## 2022-06-01 DIAGNOSIS — K219 Gastro-esophageal reflux disease without esophagitis: Secondary | ICD-10-CM | POA: Diagnosis not present

## 2022-06-01 DIAGNOSIS — J449 Chronic obstructive pulmonary disease, unspecified: Secondary | ICD-10-CM | POA: Diagnosis not present

## 2022-06-01 DIAGNOSIS — I10 Essential (primary) hypertension: Secondary | ICD-10-CM | POA: Diagnosis not present

## 2022-06-01 DIAGNOSIS — J3489 Other specified disorders of nose and nasal sinuses: Secondary | ICD-10-CM | POA: Diagnosis not present

## 2022-06-01 DIAGNOSIS — Z79899 Other long term (current) drug therapy: Secondary | ICD-10-CM | POA: Diagnosis not present

## 2022-06-01 DIAGNOSIS — M316 Other giant cell arteritis: Secondary | ICD-10-CM | POA: Diagnosis not present

## 2022-06-01 DIAGNOSIS — Z7952 Long term (current) use of systemic steroids: Secondary | ICD-10-CM | POA: Diagnosis not present

## 2022-06-01 DIAGNOSIS — J019 Acute sinusitis, unspecified: Secondary | ICD-10-CM | POA: Diagnosis not present

## 2022-06-02 DIAGNOSIS — Z792 Long term (current) use of antibiotics: Secondary | ICD-10-CM | POA: Diagnosis not present

## 2022-06-02 DIAGNOSIS — Z79899 Other long term (current) drug therapy: Secondary | ICD-10-CM | POA: Diagnosis not present

## 2022-06-02 DIAGNOSIS — E876 Hypokalemia: Secondary | ICD-10-CM | POA: Diagnosis not present

## 2022-06-02 DIAGNOSIS — R059 Cough, unspecified: Secondary | ICD-10-CM | POA: Diagnosis not present

## 2022-06-02 DIAGNOSIS — J449 Chronic obstructive pulmonary disease, unspecified: Secondary | ICD-10-CM | POA: Diagnosis not present

## 2022-06-02 DIAGNOSIS — J3489 Other specified disorders of nose and nasal sinuses: Secondary | ICD-10-CM | POA: Diagnosis not present

## 2022-06-02 DIAGNOSIS — K219 Gastro-esophageal reflux disease without esophagitis: Secondary | ICD-10-CM | POA: Diagnosis not present

## 2022-06-02 DIAGNOSIS — J019 Acute sinusitis, unspecified: Secondary | ICD-10-CM | POA: Diagnosis not present

## 2022-06-02 DIAGNOSIS — M316 Other giant cell arteritis: Secondary | ICD-10-CM | POA: Diagnosis not present

## 2022-06-02 DIAGNOSIS — I10 Essential (primary) hypertension: Secondary | ICD-10-CM | POA: Diagnosis not present

## 2022-06-02 DIAGNOSIS — U071 COVID-19: Secondary | ICD-10-CM | POA: Diagnosis not present

## 2022-06-03 DIAGNOSIS — J3489 Other specified disorders of nose and nasal sinuses: Secondary | ICD-10-CM | POA: Diagnosis not present

## 2022-06-03 DIAGNOSIS — J449 Chronic obstructive pulmonary disease, unspecified: Secondary | ICD-10-CM | POA: Diagnosis not present

## 2022-06-03 DIAGNOSIS — I10 Essential (primary) hypertension: Secondary | ICD-10-CM | POA: Diagnosis not present

## 2022-06-03 DIAGNOSIS — Z7952 Long term (current) use of systemic steroids: Secondary | ICD-10-CM | POA: Diagnosis not present

## 2022-06-03 DIAGNOSIS — Z79899 Other long term (current) drug therapy: Secondary | ICD-10-CM | POA: Diagnosis not present

## 2022-06-03 DIAGNOSIS — J019 Acute sinusitis, unspecified: Secondary | ICD-10-CM | POA: Diagnosis not present

## 2022-06-03 DIAGNOSIS — U071 COVID-19: Secondary | ICD-10-CM | POA: Diagnosis not present

## 2022-06-10 DIAGNOSIS — J449 Chronic obstructive pulmonary disease, unspecified: Secondary | ICD-10-CM | POA: Diagnosis not present

## 2022-06-10 DIAGNOSIS — I10 Essential (primary) hypertension: Secondary | ICD-10-CM | POA: Diagnosis not present

## 2022-06-10 DIAGNOSIS — M316 Other giant cell arteritis: Secondary | ICD-10-CM | POA: Diagnosis not present

## 2022-06-11 DIAGNOSIS — M316 Other giant cell arteritis: Secondary | ICD-10-CM | POA: Diagnosis not present

## 2022-06-11 DIAGNOSIS — U071 COVID-19: Secondary | ICD-10-CM | POA: Diagnosis not present

## 2022-06-11 DIAGNOSIS — J310 Chronic rhinitis: Secondary | ICD-10-CM | POA: Diagnosis not present

## 2022-06-13 DIAGNOSIS — K219 Gastro-esophageal reflux disease without esophagitis: Secondary | ICD-10-CM | POA: Diagnosis not present

## 2022-06-14 ENCOUNTER — Encounter: Payer: Self-pay | Admitting: Neurology

## 2022-07-10 DIAGNOSIS — M316 Other giant cell arteritis: Secondary | ICD-10-CM | POA: Diagnosis not present

## 2022-07-10 DIAGNOSIS — M1990 Unspecified osteoarthritis, unspecified site: Secondary | ICD-10-CM | POA: Diagnosis not present

## 2022-07-10 DIAGNOSIS — J449 Chronic obstructive pulmonary disease, unspecified: Secondary | ICD-10-CM | POA: Diagnosis not present

## 2022-07-10 DIAGNOSIS — I10 Essential (primary) hypertension: Secondary | ICD-10-CM | POA: Diagnosis not present

## 2022-07-15 DIAGNOSIS — I10 Essential (primary) hypertension: Secondary | ICD-10-CM | POA: Diagnosis not present

## 2022-08-27 DIAGNOSIS — H2513 Age-related nuclear cataract, bilateral: Secondary | ICD-10-CM | POA: Diagnosis not present

## 2022-08-27 DIAGNOSIS — M316 Other giant cell arteritis: Secondary | ICD-10-CM | POA: Diagnosis not present

## 2022-08-27 DIAGNOSIS — H40013 Open angle with borderline findings, low risk, bilateral: Secondary | ICD-10-CM | POA: Diagnosis not present

## 2022-10-04 DIAGNOSIS — H5213 Myopia, bilateral: Secondary | ICD-10-CM | POA: Diagnosis not present
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
## 2022-11-04 ENCOUNTER — Ambulatory Visit: Payer: Medicare Other | Admitting: Neurology

## 2022-11-04 DIAGNOSIS — Z743 Need for continuous supervision: Secondary | ICD-10-CM | POA: Diagnosis not present

## 2022-11-04 DIAGNOSIS — R110 Nausea: Secondary | ICD-10-CM | POA: Diagnosis not present

## 2022-11-04 DIAGNOSIS — R040 Epistaxis: Secondary | ICD-10-CM | POA: Diagnosis not present

## 2022-11-04 DIAGNOSIS — R52 Pain, unspecified: Secondary | ICD-10-CM | POA: Diagnosis not present

## 2022-11-04 DIAGNOSIS — J449 Chronic obstructive pulmonary disease, unspecified: Secondary | ICD-10-CM | POA: Diagnosis not present

## 2022-11-04 DIAGNOSIS — E785 Hyperlipidemia, unspecified: Secondary | ICD-10-CM | POA: Diagnosis not present

## 2022-11-04 DIAGNOSIS — I10 Essential (primary) hypertension: Secondary | ICD-10-CM | POA: Diagnosis not present

## 2022-11-04 DIAGNOSIS — K219 Gastro-esophageal reflux disease without esophagitis: Secondary | ICD-10-CM | POA: Diagnosis not present

## 2022-11-04 DIAGNOSIS — Z885 Allergy status to narcotic agent status: Secondary | ICD-10-CM | POA: Diagnosis not present

## 2022-11-04 DIAGNOSIS — Z888 Allergy status to other drugs, medicaments and biological substances status: Secondary | ICD-10-CM | POA: Diagnosis not present

## 2022-11-04 DIAGNOSIS — R6889 Other general symptoms and signs: Secondary | ICD-10-CM | POA: Diagnosis not present

## 2022-11-04 DIAGNOSIS — R531 Weakness: Secondary | ICD-10-CM | POA: Diagnosis not present

## 2022-11-04 DIAGNOSIS — R58 Hemorrhage, not elsewhere classified: Secondary | ICD-10-CM | POA: Diagnosis not present

## 2022-11-06 ENCOUNTER — Encounter (HOSPITAL_COMMUNITY): Payer: Self-pay | Admitting: *Deleted

## 2022-11-06 ENCOUNTER — Emergency Department (HOSPITAL_COMMUNITY)
Admission: EM | Admit: 2022-11-06 | Discharge: 2022-11-06 | Disposition: A | Discharge status: Home / Self Care | Payer: 59 | Attending: Emergency Medicine | Admitting: Emergency Medicine

## 2022-11-06 ENCOUNTER — Emergency Department (HOSPITAL_COMMUNITY): Payer: 59

## 2022-11-06 ENCOUNTER — Other Ambulatory Visit: Payer: Self-pay

## 2022-11-06 DIAGNOSIS — Z79899 Other long term (current) drug therapy: Secondary | ICD-10-CM | POA: Diagnosis not present

## 2022-11-06 DIAGNOSIS — R2990 Unspecified symptoms and signs involving the nervous system: Secondary | ICD-10-CM

## 2022-11-06 DIAGNOSIS — R197 Diarrhea, unspecified: Secondary | ICD-10-CM | POA: Diagnosis not present

## 2022-11-06 DIAGNOSIS — F1410 Cocaine abuse, uncomplicated: Secondary | ICD-10-CM | POA: Diagnosis not present

## 2022-11-06 DIAGNOSIS — R519 Headache, unspecified: Secondary | ICD-10-CM | POA: Insufficient documentation

## 2022-11-06 DIAGNOSIS — R29818 Other symptoms and signs involving the nervous system: Secondary | ICD-10-CM | POA: Diagnosis not present

## 2022-11-06 DIAGNOSIS — R112 Nausea with vomiting, unspecified: Secondary | ICD-10-CM | POA: Insufficient documentation

## 2022-11-06 DIAGNOSIS — R040 Epistaxis: Secondary | ICD-10-CM | POA: Diagnosis not present

## 2022-11-06 DIAGNOSIS — Z7951 Long term (current) use of inhaled steroids: Secondary | ICD-10-CM | POA: Diagnosis not present

## 2022-11-06 DIAGNOSIS — I10 Essential (primary) hypertension: Secondary | ICD-10-CM | POA: Insufficient documentation

## 2022-11-06 DIAGNOSIS — Z7982 Long term (current) use of aspirin: Secondary | ICD-10-CM | POA: Insufficient documentation

## 2022-11-06 DIAGNOSIS — J449 Chronic obstructive pulmonary disease, unspecified: Secondary | ICD-10-CM | POA: Diagnosis not present

## 2022-11-06 DIAGNOSIS — R531 Weakness: Secondary | ICD-10-CM | POA: Diagnosis not present

## 2022-11-06 LAB — DIFFERENTIAL
Abs Immature Granulocytes: 0.02 10*3/uL (ref 0.00–0.07)
Basophils Absolute: 0.1 10*3/uL (ref 0.0–0.1)
Basophils Relative: 1 %
Eosinophils Absolute: 0.1 10*3/uL (ref 0.0–0.5)
Eosinophils Relative: 2 %
Immature Granulocytes: 0 %
Lymphocytes Relative: 41 %
Lymphs Abs: 2.4 10*3/uL (ref 0.7–4.0)
Monocytes Absolute: 0.6 10*3/uL (ref 0.1–1.0)
Monocytes Relative: 10 %
Neutro Abs: 2.7 10*3/uL (ref 1.7–7.7)
Neutrophils Relative %: 46 %

## 2022-11-06 LAB — MAGNESIUM: Magnesium: 2 mg/dL (ref 1.7–2.4)

## 2022-11-06 LAB — COMPREHENSIVE METABOLIC PANEL
ALT: 16 U/L (ref 0–44)
AST: 28 U/L (ref 15–41)
Albumin: 3.7 g/dL (ref 3.5–5.0)
Alkaline Phosphatase: 76 U/L (ref 38–126)
Anion gap: 10 (ref 5–15)
BUN: 16 mg/dL (ref 6–20)
CO2: 20 mmol/L — ABNORMAL LOW (ref 22–32)
Calcium: 8.8 mg/dL — ABNORMAL LOW (ref 8.9–10.3)
Chloride: 107 mmol/L (ref 98–111)
Creatinine, Ser: 0.87 mg/dL (ref 0.44–1.00)
GFR, Estimated: >60 mL/min (ref >60)
Glucose, Bld: 102 mg/dL — ABNORMAL HIGH (ref 70–99)
Potassium: 3.4 mmol/L — ABNORMAL LOW (ref 3.5–5.1)
Sodium: 137 mmol/L (ref 135–145)
Total Bilirubin: 0.4 mg/dL (ref 0.3–1.2)
Total Protein: 7.7 g/dL (ref 6.5–8.1)

## 2022-11-06 LAB — CBC
HCT: 35.7 % — ABNORMAL LOW (ref 36.0–46.0)
Hemoglobin: 11.4 g/dL — ABNORMAL LOW (ref 12.0–15.0)
MCH: 26.4 pg (ref 26.0–34.0)
MCHC: 31.9 g/dL (ref 30.0–36.0)
MCV: 82.6 fL (ref 80.0–100.0)
Platelets: 277 10*3/uL (ref 150–400)
RBC: 4.32 MIL/uL (ref 3.87–5.11)
RDW: 18.2 % — ABNORMAL HIGH (ref 11.5–15.5)
WBC: 5.8 10*3/uL (ref 4.0–10.5)
nRBC: 0 % (ref 0.0–0.2)

## 2022-11-06 LAB — ETHANOL: Alcohol, Ethyl (B): <10 mg/dL (ref <10)

## 2022-11-06 LAB — PROTIME-INR
INR: 1.2 (ref 0.8–1.2)
Prothrombin Time: 14.6 seconds (ref 11.4–15.2)

## 2022-11-06 LAB — APTT: aPTT: 37 seconds — ABNORMAL HIGH (ref 24–36)

## 2022-11-06 MED ORDER — ONDANSETRON 4 MG PO TBDP: 4.0000 mg | ORAL_TABLET | Freq: Three times a day (TID) | ORAL | 0 refills | Status: AC | PRN

## 2022-11-06 MED ORDER — DIPHENHYDRAMINE HCL 50 MG/ML IJ SOLN
12.5000 mg | Freq: Once | INTRAMUSCULAR | Status: AC
  Administered 2022-11-06: 12.5 mg via INTRAVENOUS
  Filled 2022-11-06: qty 1

## 2022-11-06 MED ORDER — METOCLOPRAMIDE HCL 5 MG/ML IJ SOLN
10.0000 mg | Freq: Once | INTRAMUSCULAR | Status: AC
  Administered 2022-11-06: 10 mg via INTRAVENOUS
  Filled 2022-11-06: qty 2

## 2022-11-06 MED ORDER — KETOROLAC TROMETHAMINE 15 MG/ML IJ SOLN
15.0000 mg | Freq: Once | INTRAMUSCULAR | Status: AC
  Administered 2022-11-06: 15 mg via INTRAVENOUS
  Filled 2022-11-06: qty 1

## 2022-11-06 MED ORDER — ACETAMINOPHEN 325 MG PO TABS
650.0000 mg | ORAL_TABLET | Freq: Once | ORAL | Status: AC
  Administered 2022-11-06: 650 mg via ORAL
  Filled 2022-11-06: qty 2

## 2022-11-06 NOTE — ED Notes (Signed)
Per neurotele, pt does not meet criteria for TNK

## 2022-11-06 NOTE — Progress Notes (Signed)
Code Stroke activated @ [id number].  Extended case.  To CT @ 1722.  Dr. Rory Percy on camera @ 1723.  Jarold Song BSN, RN telestroke

## 2022-11-06 NOTE — ED Provider Notes (Signed)
[place] Provider Note   CSN: [id number] Arrival date & time: 11/06/22  1310     History  Chief Complaint  Patient presents with   Epistaxis    Sarah Ochoa is a 62 y.o. female.   Epistaxis Associated symptoms: headaches   Patient presents for multiple complaints.  Medical history includes alcohol abuse, cocaine abuse, COPD, anxiety, depression, HTN, arthritis, pancreatitis.  She is seen outside hospital Monday morning for headache and epistaxis.  Nasal packing was placed to right nostril.  She has reportedly had continued bleeding around packing.  She has continued headache.  Today, she had nausea, vomiting, and diarrhea.     Home Medications Prior to Admission medications   Medication Sig Start Date End Date Taking? Authorizing Provider  acetaminophen (TYLENOL) 500 MG tablet Take 1,000 mg by mouth every 8 (eight) hours as needed for moderate pain or fever.   Yes [provider]  albuterol (PROVENTIL HFA;VENTOLIN HFA) 108 (90 Base) MCG/ACT inhaler Inhale 1-2 puffs into the lungs every 6 (six) hours as needed for wheezing or shortness of breath. 07/15/16  Yes Lily Kocher, PA-C  albuterol (PROVENTIL) (2.5 MG/3ML) 0.083% nebulizer solution Take 2.5 mg by nebulization every 4 (four) hours as needed for wheezing.  12/17/17  Yes [provider]  amLODipine (NORVASC) 10 MG tablet Take 10 mg by mouth daily. 12/01/17  Yes [provider]  aspirin EC 325 MG tablet Take 325 mg by mouth daily.   Yes [provider]  BIOTIN PO Take 500 mg by mouth daily.   Yes [provider]  diphenhydrAMINE (BENADRYL) 25 MG tablet Take 25 mg by mouth every 4 (four) hours as needed for allergies.   Yes [provider]  estradiol (ESTRACE) 2 MG tablet Take 1 tablet (2 mg total) by mouth daily. 03/04/22  Yes Derrek Monaco A, NP  famotidine (PEPCID) 20 MG tablet TAKE 1 TABLET BY MOUTH EVERYDAY AT  BEDTIME Patient taking differently: Take 20 mg by mouth at bedtime. 08/22/19  Yes Noralyn Pick, NP  fluticasone (FLONASE) 50 MCG/ACT nasal spray Place 2 sprays into the nose daily as needed for allergies.    Yes [provider]  gabapentin (NEURONTIN) 300 MG capsule Take 300 mg by mouth 3 (three) times daily.   Yes [provider]  guaiFENesin (MUCINEX) 600 MG 12 hr tablet Take 600 mg by mouth 2 (two) times daily as needed for cough or to loosen phlegm.   Yes [provider]  ibuprofen (ADVIL) 800 MG tablet Take 800 mg by mouth every 8 (eight) hours as needed for mild pain or moderate pain.   Yes [provider]  ipratropium-albuterol (DUONEB) 0.5-2.5 (3) MG/3ML SOLN Take 3 mLs by nebulization See admin instructions. 4-6 hours as needed for shortness of breath 06/10/22  Yes [provider]  loratadine (CLARITIN) 10 MG tablet Take 10 mg by mouth daily.   Yes [provider]  montelukast (SINGULAIR) 10 MG tablet Take 10 mg by mouth daily.    Yes [provider]  Multiple Vitamin (MULTIVITAMIN WITH MINERALS) TABS tablet Take 1 tablet by mouth daily. 02/23/21  Yes Johnson, Clanford L, MD  ondansetron (ZOFRAN-ODT) 4 MG disintegrating tablet Take 1 tablet (4 mg total) by mouth every 8 (eight) hours as needed for nausea or vomiting. 11/06/22  Yes Godfrey Pick, MD  pantoprazole (PROTONIX) 40 MG tablet Take 1 tablet (40 mg total) by mouth daily. 02/22/21 11/06/22 Yes Wynetta Emery,  Clanford L, MD  sertraline (ZOLOFT) 100 MG tablet Take 100 mg by mouth daily.  10/25/17  Yes [provider]  tiZANidine (ZANAFLEX) 4 MG tablet Take 1 tablet by mouth at bedtime as needed for spasms Patient taking differently: Take 4 mg by mouth at bedtime. 02/04/22  Yes Carole Civil, MD  TRELEGY ELLIPTA 100-62.5-25 MCG/INH AEPB Inhale 1 puff into the lungs daily as needed (COPD).  09/25/18  Yes [provider]  hydrOXYzine (ATARAX) 25 MG tablet Take  by mouth. Patient not taking: Reported on 11/06/2022    [provider]      Allergies    Lisinopril, Codeine, Sulfa antibiotics, Sulfonamide derivatives, and Sulfamethoxazole    Review of Systems   Review of Systems  HENT:  Positive for nosebleeds and voice change.   Gastrointestinal:  Positive for diarrhea, nausea and vomiting.  Neurological:  Positive for weakness and headaches.  All other systems reviewed and are negative.   Physical Exam Updated Vital Signs BP (!) 120/90   Pulse 98   Temp 98.1 F (36.7 C) (Oral)   Resp 19   Ht 5' 5"$  (1.651 m)   Wt 59 kg   SpO2 97%   BMI 21.63 kg/m  Physical Exam Vitals and nursing note reviewed.  Constitutional:      General: She is not in acute distress.    Appearance: Normal appearance. She is well-developed. She is not toxic-appearing or diaphoretic.  HENT:     Head: Normocephalic and atraumatic.     Right Ear: External ear normal.     Left Ear: External ear normal.     Nose: Nose normal.     Comments: Rhino Rocket in place in right nares.  No active bleeding is present.    Mouth/Throat:     Mouth: Mucous membranes are moist.  Eyes:     Extraocular Movements: Extraocular movements intact.     Conjunctiva/sclera: Conjunctivae normal.  Cardiovascular:     Rate and Rhythm: Normal rate and regular rhythm.     Heart sounds: No murmur heard. Pulmonary:     Effort: Pulmonary effort is normal. No respiratory distress.     Breath sounds: Normal breath sounds. No wheezing or rales.  Chest:     Chest wall: No tenderness.  Abdominal:     General: There is no distension.     Palpations: Abdomen is soft.     Tenderness: There is no abdominal tenderness.  Musculoskeletal:        General: No swelling. Normal range of motion.     Cervical back: Normal range of motion and neck supple.     Right lower leg: No edema.     Left lower leg: No edema.  Skin:    General: Skin is warm and dry.     Capillary Refill: Capillary refill  takes less than 2 seconds.     Coloration: Skin is not jaundiced or pale.  Neurological:     General: No focal deficit present.     Mental Status: She is alert and oriented to person, place, and time.     Sensory: Sensory deficit (Diminished sensation in left hemibody) present.     Motor: Weakness (Left hemibody) present.     Comments: Slightly slurred speech.   Psychiatric:        Mood and Affect: Mood normal.        Behavior: Behavior normal.     ED Results / Procedures / Treatments   Labs (all labs  ordered are listed, but only abnormal results are displayed) Labs Reviewed  APTT - Abnormal; Notable for the following components:      Result Value   aPTT 37 (*)    All other components within normal limits  CBC - Abnormal; Notable for the following components:   Hemoglobin 11.4 (*)    HCT 35.7 (*)    RDW 18.2 (*)    All other components within normal limits  COMPREHENSIVE METABOLIC PANEL - Abnormal; Notable for the following components:   Potassium 3.4 (*)    CO2 20 (*)    Glucose, Bld 102 (*)    Calcium 8.8 (*)    All other components within normal limits  PROTIME-INR  DIFFERENTIAL  MAGNESIUM  ETHANOL  RAPID URINE DRUG SCREEN, HOSP PERFORMED  URINALYSIS, ROUTINE W REFLEX MICROSCOPIC  I-STAT CHEM 8, ED    EKG EKG Interpretation  Date/Time:  Wednesday November 06 2022 17:09:45 EST Ventricular Rate:  74 PR Interval:  169 QRS Duration: 84 QT Interval:  382 QTC Calculation: 424 R Axis:   65 Text Interpretation: Sinus rhythm Probable left atrial enlargement Anterior infarct, old Confirmed by Godfrey Pick [phone]) on 11/06/2022 6:09:57 PM  Radiology MR BRAIN WO CONTRAST  Result Date: 11/06/2022 CLINICAL DATA:  Neuro deficit, acute, stroke suspected EXAM: MRI HEAD WITHOUT CONTRAST TECHNIQUE: Multiplanar, multiecho pulse sequences of the brain and surrounding structures were obtained without intravenous contrast. COMPARISON:  CT head from the same day. FINDINGS: Brain: No  acute infarction, hemorrhage, hydrocephalus, extra-axial collection or mass lesion. Vascular: Major arterial flow voids are maintained skull base. Skull and upper cervical spine: Normal marrow signal. Sinuses/Orbits: Mild paranasal sinus mucosal thickening. No acute orbital findings. Other: Small right mastoid effusion. IMPRESSION: Unremarkable brain MRI for patient age.  No acute abnormality. Electronically Signed   By: Margaretha Sheffield M.D.   On: 11/06/2022 18:42   CT HEAD CODE STROKE WO CONTRAST`  Result Date: 11/06/2022 CLINICAL DATA:  Code stroke. Neuro deficit, acute, stroke suspected. EXAM: CT HEAD WITHOUT CONTRAST TECHNIQUE: Contiguous axial images were obtained from the base of the skull through the vertex without intravenous contrast. RADIATION DOSE REDUCTION: This exam was performed according to the departmental dose-optimization program which includes automated exposure control, adjustment of the mA and/or kV according to patient size and/or use of iterative reconstruction technique. COMPARISON:  MRI 05/31/2022 FINDINGS: Brain: Normal CT appearance of the brain. No evidence of old or acute stroke, mass, hemorrhage, hydrocephalus or extra-axial collection. Vascular: No abnormal vascular finding. Skull: Normal Sinuses/Orbits: Previous functional endoscopic sinus surgery. Some mucosal thickening. Orbits negative. Other: None ASPECTS ([place] Stroke Program Early CT Score) - Ganglionic level infarction (caudate, lentiform nuclei, internal capsule, insula, M1-M3 cortex): 7 - Supraganglionic infarction (M4-M6 cortex): 3 Total score (0-10 with 10 being normal): 10 IMPRESSION: 1. Normal head CT. 2. Aspects is 10. These results were called by telephone at the time of interpretation on 11/06/2022 at 5:37 pm to provider Godfrey Pick , who verbally acknowledged these results. Electronically Signed   By: Nelson Chimes M.D.   On: 11/06/2022 17:39    Procedures Procedures    Medications Ordered in  ED Medications  metoCLOPramide (REGLAN) injection 10 mg (10 mg Intravenous Given 11/06/22 1959)  diphenhydrAMINE (BENADRYL) injection 12.5 mg (12.5 mg Intravenous Given 11/06/22 1959)  acetaminophen (TYLENOL) tablet 650 mg (650 mg Oral Given 11/06/22 1959)  ketorolac (TORADOL) 15 MG/ML injection 15 mg (15 mg Intravenous Given 11/06/22 2207)    ED Course/ Medical Decision Making/  A&P                             Medical Decision Making Amount and/or Complexity of Data Reviewed Labs: ordered. Radiology: ordered.  Risk OTC drugs. Prescription drug management.   This patient presents to the ED for concern of multiple complaints, this involves an extensive number of treatment options, and is a complaint that carries with it a high risk of complications and morbidity.  The differential diagnosis includes intoxication, CVA, TIA, ICH, withdrawal   Co morbidities that complicate the patient evaluation  alcohol abuse, cocaine abuse, COPD, anxiety, depression, HTN, arthritis, pancreatitis   Additional history obtained:  Additional history obtained from patient's daughter External records from outside source obtained and reviewed including EMR   Lab Tests:  I Ordered, and personally interpreted labs.  The pertinent results include: Baseline anemia, no leukocytosis, normal kidney function, normal electrolytes   Imaging Studies ordered:  I ordered imaging studies including CT head, MRI brain I independently visualized and interpreted imaging which showed no acute findings I agree with the radiologist interpretation   Cardiac Monitoring: / EKG:  The patient was maintained on a cardiac monitor.  I personally viewed and interpreted the cardiac monitored which showed an underlying rhythm of: Sinus rhythm   Consultations Obtained:  I requested consultation with the neurologist, Dr. Rory Percy,  and discussed lab and imaging findings as well as pertinent plan - they recommend: Neurology to  sign off pending negative MRI   Problem List / ED Course / Critical interventions / Medication management  Patient presents initially for headache, epistaxis, nausea, vomiting, diarrhea.  On assessment, she does appear to have slurred speech.  Patient and daughter, who, is at bedside, states that this is not normal for her.  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  Patient states that her speech changed this morning upon wakening.  On further neuroexam, patient does have left hemibody sensory loss and weakness.  NIHSS is 6.  Patient states that she went to bed last night at 6 PM but was up throughout the night.  She did not notice any symptoms late last night.  Given timeline and concern of LVO, code stroke was initiated.  MRI and CTA imaging were ordered in addition to lab work.  At this time, right nostril is hemostatic.  Rhino Rocket is in place.  Will leave that in place pending stroke workup.  Patient underwent CT head which was negative for acute findings.  I spoke with neurologist on-call, who recommends MRI.  If MRI is negative, neurology will sign off.  MRI did not show any acute findings.  Patient was informed of reassuring workup results.  She was treated with headache cocktail.  Rhino Rocket was removed and nose is hemostatic.  Patient was discharged in stable condition. I ordered medication including Tylenol, Benadryl, Reglan, Toradol for headache Reevaluation of the patient after these medicines showed that the patient improved I have reviewed the patients home medicines and have made adjustments as needed   Social Determinants of Health:  Polysubstance abuse         Final Clinical Impression(s) / ED Diagnoses Final diagnoses:  Bad headache    Rx / DC Orders ED Discharge Orders          Ordered    ondansetron (ZOFRAN-ODT) 4 MG disintegrating tablet  Every 8 hours PRN  11/06/22 2256              Godfrey Pick,  MD 11/06/22 2257

## 2022-11-06 NOTE — ED Notes (Signed)
Patient transported to MRI 

## 2022-11-06 NOTE — Discharge Instructions (Addendum)
Take ibuprofen and Tylenol as needed for headaches.  A prescription for a medication called Zofran was sent to your pharmacy.  Take this as needed for nausea.  Return to the emergency department for any new or worsening symptoms of concern.

## 2022-11-06 NOTE — Consult Note (Addendum)
[facility] Telemedicine Consult  Requesting Provider: Dr Doren Custard Consult Participants: Dr. Jerelyn Charles, Telespecialist RN [facility]   Bedside [place] Location of the provider: [place] Location of the patient: [facility]  This consult was provided via telemedicine with 2-way video and audio communication. The patient/family was informed that care would be provided in this way and agreed to receive care in this manner.   Chief Complaint: code stroke for left sided weakness of unknown duration  HPI: 62 year old woman past medical history of alcohol abuse, anxiety, depression, COPD, recent evaluation at [facility] for nosebleed, history of avascular necrosis of both hips, who reports having undergone a biopsy for the fear of losing her vision recently and since has been having nosebleeds, was in her usual state of health per family on Monday and since started having more nosebleeds, initially packed at [facility] and then this morning complaint of nosebleed from the other side noted to have some left-sided weakness for which code stroke was activated.  The patient has been seen 2 days ago at the [facility] for bleeding from the nostrils likely secondary to cocaine abuse which was packed at [facility] and she was discharged home with recommendations for outpatient follow-up.She said she was tired and weak and this morning started bleeding from the other side and her daughter brought her in for evaluation of the nosebleed.  In the hospital at [facility], it was noted that she had some drift on the left leg on examination and a code stroke was activated. Patient did not notice focal weakness and complained of general weakness  The last known well time is extremely unclear and the daughter who I spoke with over the phone said that the patient did not complain of any weakness till 5 AM this morning which at best is a last known well. She said the patient has not been in her usual state of health since Monday  due to nosebleeds and general weakness. At best - I consider 0500 today as her LKW but as mentioned above, that is highly unreliable.  She appeared very worried about her condition and kept insisting that all her problems of started after the biopsy. Temporal artery Biopsy was at [facility] in 05/2022. GCA negative per report that I could see in care everywhere. She is supposed to f/u with Dr Tomi Likens at [facility] in May.   Past Medical History:  Diagnosis Date   Acid reflux    Alcohol abuse    Anxiety    Anxiety and depression    Asthma    Avascular necrosis of hip (HCC)    RIGHT   Chronic ankle pain    Chronic back pain    Chronic knee pain    COPD (chronic obstructive pulmonary disease) (HCC)    Depression    History of stomach ulcers    HTN (hypertension)    Left hip pain    Rash    RT UPPER ARM    No current facility-administered medications for this encounter.  Current Outpatient Medications:    albuterol (PROVENTIL HFA;VENTOLIN HFA) 108 (90 Base) MCG/ACT inhaler, Inhale 1-2 puffs into the lungs every 6 (six) hours as needed for wheezing or shortness of breath., Disp: 1 Inhaler, Rfl: 0   albuterol (PROVENTIL) (2.5 MG/3ML) 0.083% nebulizer solution, Take 2.5 mg by nebulization every 4 (four) hours as needed for wheezing. , Disp: , Rfl: 11   amLODipine (NORVASC) 10 MG tablet, Take 10 mg by mouth daily., Disp: , Rfl:  11   aspirin EC 325 MG tablet, Take 325 mg by mouth daily., Disp: , Rfl:    BIOTIN PO, Take 500 mg by mouth daily., Disp: , Rfl:    dicyclomine (BENTYL) 10 MG capsule, TAKE 1 CAPSULE BY MOUTH 2 TIMES DAILY AS NEEDED (ABDOMINAL PAIN)., Disp: 270 capsule, Rfl: 1   diphenhydrAMINE (BENADRYL) 25 MG tablet, Take 25 mg by mouth every 4 (four) hours as needed for allergies., Disp: , Rfl:    doxycycline (VIBRA-TABS) 100 MG tablet, Take 1 tablet (100 mg total) by mouth 2 (two) times daily., Disp: 14 tablet, Rfl: 0   estradiol (ESTRACE) 2 MG tablet, Take 1 tablet (2 mg total) by mouth  daily., Disp: 90 tablet, Rfl: 4   famotidine (PEPCID) 20 MG tablet, TAKE 1 TABLET BY MOUTH EVERYDAY AT BEDTIME (Patient taking differently: Take 20 mg by mouth at bedtime.), Disp: 30 tablet, Rfl: 1   fluconazole (DIFLUCAN) 150 MG tablet, Take 1 now and 1 in 3 days (Patient not taking: Reported on 03/04/2022), Disp: 2 tablet, Rfl: 1   fluticasone (FLONASE) 50 MCG/ACT nasal spray, Place 2 sprays into the nose daily as needed for allergies. , Disp: , Rfl:    ibuprofen (ADVIL) 800 MG tablet, Take 800 mg by mouth every 8 (eight) hours as needed for mild pain or moderate pain., Disp: , Rfl:    montelukast (SINGULAIR) 10 MG tablet, Take 10 mg by mouth daily. , Disp: , Rfl:    Multiple Vitamin (MULTIVITAMIN WITH MINERALS) TABS tablet, Take 1 tablet by mouth daily., Disp: , Rfl:    pantoprazole (PROTONIX) 40 MG tablet, Take 1 tablet (40 mg total) by mouth daily., Disp: , Rfl:    sertraline (ZOLOFT) 100 MG tablet, Take 100 mg by mouth daily. , Disp: , Rfl:    tiZANidine (ZANAFLEX) 4 MG tablet, Take 1 tablet by mouth at bedtime as needed for spasms, Disp: 30 tablet, Rfl: 11   TRELEGY ELLIPTA 100-62.5-25 MCG/INH AEPB, Inhale 1 puff into the lungs daily as needed (COPD). , Disp: , Rfl:   LKW: Unclear, at best at 5 AM today tpa given?: No, outside the window IR Thrombectomy? No, exam not consistent with LVO Modified Rankin Scale: 2-Slight disability-UNABLE to perform all activities but does not need assistance Time of teleneurologist evaluation: 1723 hrs.  Exam: Vitals:   11/06/22 1553 11/06/22 1630  BP:  124/89  Pulse: 64 77  Resp:  17  Temp:    SpO2: 96% 98%    General: Awake alert in no distress HEENT: Right nostril packed Neurological exam Awake alert oriented x 2 Mild dysarthria Hesitant speech No aphasia Cranial nerves II to XII intact Motor examination with no drift in the upper extremities.  Mild pendulous drift in both lower extremities. Sensation intact initially but then some  left-sided weakness was reported per the ED report No gross dysmetria  NIHSS 1A: Level of Consciousness - 0 1B: Ask Month and Age - 1 1C: 'Blink Eyes' & 'Squeeze Hands' - 0 2: Test Horizontal Extraocular Movements - 0 3: Test Visual Fields - 0 4: Test Facial Palsy - 0 5A: Test Left Arm Motor Drift - 0 5B: Test Right Arm Motor Drift - 0 6A: Test Left Leg Motor Drift - 1 6B: Test Right Leg Motor Drift - 1 7: Test Limb Ataxia - 0 8: Test Sensation - 0 9: Test Language/Aphasia- 0 10: Test Dysarthria - 1 11: Test Extinction/Inattention - 0 NIHSS score: 4   Imaging Reviewed: CT  head code stroke-normal  CareEverywhere surgical pathology result - negative for GCA (screenshot attached)    Labs reviewed in epic and pertinent values follow: CBC    Component Value Date/Time   WBC 8.2 02/21/2021 0520   RBC 3.81 (L) 02/21/2021 0520   HGB 11.3 (L) 02/21/2021 0520   HCT 35.5 (L) 02/21/2021 0520   PLT 291 02/21/2021 0520   MCV 93.2 02/21/2021 0520   MCH 29.7 02/21/2021 0520   MCHC 31.8 02/21/2021 0520   RDW 16.4 (H) 02/21/2021 0520   LYMPHSABS 3.5 02/21/2021 0520   MONOABS 1.0 02/21/2021 0520   EOSABS 0.3 02/21/2021 0520   BASOSABS 0.1 02/21/2021 0520   CMP     Component Value Date/Time   NA 140 02/21/2021 0520   K 3.4 (L) 02/21/2021 0520   CL 113 (H) 02/21/2021 0520   CO2 23 02/21/2021 0520   GLUCOSE 110 (H) 02/21/2021 0520   BUN 5 (L) 02/21/2021 0520   CREATININE 0.57 02/21/2021 0520   CREATININE 1.13 (H) 07/28/2019 1205   CALCIUM 8.5 (L) 02/21/2021 0520   PROT 6.4 (L) 02/21/2021 0520   ALBUMIN 3.2 (L) 02/21/2021 0520   AST 21 02/21/2021 0520   ALT 14 02/21/2021 0520   ALKPHOS 52 02/21/2021 0520   BILITOT 0.5 02/21/2021 0520   GFRNONAA >60 02/21/2021 0520   GFRAA >60 07/13/2019 1308   Assessment: 62 year old woman with a complex medical history including that of substance abuse and recent nosebleed presenting for evaluation of recurrent nosebleed and noted to have  possible left-sided weakness.  She reports history of avascular necrosis of both hips for which she is weak on both legs.  On my Examination, she had drift in both lower extremities.  She had no drift in the upper extremities.  No cranial nerve deficits. I do not think that her current presentation is consistent with a stroke at this time but given her risk factors including that of cocaine abuse, I would recommend an MRI to rule out an acute stroke. Primary issue at this time is epistaxis-management per ER   Recommendations:  Exam not consistent with LVO-no indication for emergent vessel studies Stat MRI brain without contrast-if she has a stroke, will need admission for further workup Management of nosebleed per emergency room Please call back if the MRI reveals any positive findings. She will need admission for stroke work up IF MRI is positive. Check tox screen If MRI negative, no inpatient neurological work up needed. Keep the appt with Dr. Tomi Likens at [facility] in May.  Plan was discussed with Dr. Doren Custard, ED provider Also d/w daughter, over the phone  -- Amie Portland, MD Neurologist [facility] Pager: [phone]

## 2022-11-06 NOTE — ED Triage Notes (Addendum)
Pt with nose bleed Monday morning and HA, seen at [facility], nasal packing to right nostril.  Family member states bleeding around packing. No bleeding noted in triage. Pt with continued HA.  + N/V and diarrhea today. Took tylenol about 2 hours ago.

## 2022-11-06 NOTE — ED Notes (Signed)
Attempted swallow screen with pt. Pt was able to follow commands and deal lips prior to swallow screening, A & O x 4; However, she did stop drinking and stated "there feels like a lump in my throat". Pt did cough afterwards, but states "my mouth is so dry and it feels like a lump in my throat". No evidence of facial drooping, no evidence of water running out of either side of mouth

## 2022-11-06 NOTE — ED Notes (Signed)
Pt had a very small BM. Pt has been cleaned up and placed in a clean brief and new purewick in place. Pt resting at this time.

## 2022-11-07 LAB — I-STAT CHEM 8, ED
BUN: 16 mg/dL (ref 6–20)
Calcium, Ion: 1.19 mmol/L (ref 1.15–1.40)
Chloride: 112 mmol/L — ABNORMAL HIGH (ref 98–111)
Creatinine, Ser: 0.9 mg/dL (ref 0.44–1.00)
Glucose, Bld: 98 mg/dL (ref 70–99)
HCT: 36 % (ref 36.0–46.0)
Hemoglobin: 12.2 g/dL (ref 12.0–15.0)
Potassium: 3.6 mmol/L (ref 3.5–5.1)
Sodium: 144 mmol/L (ref 135–145)
TCO2: 20 mmol/L — ABNORMAL LOW (ref 22–32)

## 2022-11-13 DIAGNOSIS — H52223 Regular astigmatism, bilateral: Secondary | ICD-10-CM | POA: Diagnosis not present

## 2022-11-13 DIAGNOSIS — H5203 Hypermetropia, bilateral: Secondary | ICD-10-CM | POA: Diagnosis not present

## 2022-12-07 ENCOUNTER — Other Ambulatory Visit: Payer: Self-pay | Admitting: Orthopedic Surgery
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
## 2022-12-19 ENCOUNTER — Ambulatory Visit (INDEPENDENT_AMBULATORY_CARE_PROVIDER_SITE_OTHER): Payer: 59 | Admitting: Neurology

## 2022-12-19 ENCOUNTER — Encounter: Payer: Self-pay | Admitting: Neurology

## 2022-12-19 VITALS — BP 135/84 | HR 70 | Ht 65.0 in | Wt 121.0 lb

## 2022-12-19 DIAGNOSIS — M542 Cervicalgia: Secondary | ICD-10-CM

## 2022-12-19 DIAGNOSIS — R519 Headache, unspecified: Secondary | ICD-10-CM | POA: Diagnosis not present

## 2022-12-19 MED ORDER — TOPIRAMATE 25 MG PO TABS: 25.0000 mg | ORAL_TABLET | Freq: Every day | ORAL | 5 refills | Status: DC

## 2022-12-19 NOTE — Patient Instructions (Addendum)
Start topiramate 25mg  at bedtime.  If no improvement in 4 weeks, contact me and we can increase dose.  Side effects may include tingling and weight loss.  Drink plenty of water because it may contribute to kidney stones as well Limit use of pain relievers (Tylenol) to no more than 2 days out of week to prevent risk of rebound or medication-overuse headache. Take tizanidine up to three times daily as needed for neck pain.  Caution for drowsiness. Let me know if you would like referral to physical therapy to treat neck pain

## 2022-12-20 ENCOUNTER — Encounter: Payer: Self-pay | Admitting: Neurology

## 2023-01-09 DIAGNOSIS — Z885 Allergy status to narcotic agent status: Secondary | ICD-10-CM | POA: Diagnosis not present

## 2023-01-09 DIAGNOSIS — R0689 Other abnormalities of breathing: Secondary | ICD-10-CM | POA: Diagnosis not present

## 2023-01-09 DIAGNOSIS — K219 Gastro-esophageal reflux disease without esophagitis: Secondary | ICD-10-CM | POA: Diagnosis not present

## 2023-01-09 DIAGNOSIS — N179 Acute kidney failure, unspecified: Secondary | ICD-10-CM | POA: Diagnosis not present

## 2023-01-09 DIAGNOSIS — F32A Depression, unspecified: Secondary | ICD-10-CM | POA: Diagnosis not present

## 2023-01-09 DIAGNOSIS — I10 Essential (primary) hypertension: Secondary | ICD-10-CM | POA: Diagnosis not present

## 2023-01-09 DIAGNOSIS — E785 Hyperlipidemia, unspecified: Secondary | ICD-10-CM | POA: Diagnosis not present

## 2023-01-09 DIAGNOSIS — Z743 Need for continuous supervision: Secondary | ICD-10-CM | POA: Diagnosis not present

## 2023-01-09 DIAGNOSIS — W19XXXA Unspecified fall, initial encounter: Secondary | ICD-10-CM | POA: Diagnosis not present

## 2023-01-09 DIAGNOSIS — Z720 Tobacco use: Secondary | ICD-10-CM | POA: Diagnosis not present

## 2023-01-09 DIAGNOSIS — E876 Hypokalemia: Secondary | ICD-10-CM | POA: Diagnosis not present

## 2023-01-09 DIAGNOSIS — G928 Other toxic encephalopathy: Secondary | ICD-10-CM | POA: Diagnosis not present

## 2023-01-09 DIAGNOSIS — Z79899 Other long term (current) drug therapy: Secondary | ICD-10-CM | POA: Diagnosis not present

## 2023-01-09 DIAGNOSIS — R55 Syncope and collapse: Secondary | ICD-10-CM | POA: Diagnosis not present

## 2023-01-09 DIAGNOSIS — T402X4A Poisoning by other opioids, undetermined, initial encounter: Secondary | ICD-10-CM | POA: Diagnosis not present

## 2023-01-09 DIAGNOSIS — F17210 Nicotine dependence, cigarettes, uncomplicated: Secondary | ICD-10-CM | POA: Diagnosis not present

## 2023-01-09 DIAGNOSIS — J449 Chronic obstructive pulmonary disease, unspecified: Secondary | ICD-10-CM | POA: Diagnosis not present

## 2023-01-09 DIAGNOSIS — G9340 Encephalopathy, unspecified: Secondary | ICD-10-CM | POA: Diagnosis not present

## 2023-01-09 DIAGNOSIS — I959 Hypotension, unspecified: Secondary | ICD-10-CM | POA: Diagnosis not present

## 2023-01-09 DIAGNOSIS — E872 Acidosis, unspecified: Secondary | ICD-10-CM | POA: Diagnosis not present

## 2023-01-09 DIAGNOSIS — R059 Cough, unspecified: Secondary | ICD-10-CM | POA: Diagnosis not present

## 2023-01-09 DIAGNOSIS — R404 Transient alteration of awareness: Secondary | ICD-10-CM | POA: Diagnosis not present

## 2023-01-09 DIAGNOSIS — J690 Pneumonitis due to inhalation of food and vomit: Secondary | ICD-10-CM | POA: Diagnosis not present

## 2023-01-09 DIAGNOSIS — Z7982 Long term (current) use of aspirin: Secondary | ICD-10-CM | POA: Diagnosis not present

## 2023-01-09 DIAGNOSIS — J9602 Acute respiratory failure with hypercapnia: Secondary | ICD-10-CM | POA: Diagnosis not present

## 2023-01-09 DIAGNOSIS — Z882 Allergy status to sulfonamides status: Secondary | ICD-10-CM | POA: Diagnosis not present

## 2023-01-09 DIAGNOSIS — T68XXXA Hypothermia, initial encounter: Secondary | ICD-10-CM | POA: Diagnosis not present

## 2023-01-09 DIAGNOSIS — J9601 Acute respiratory failure with hypoxia: Secondary | ICD-10-CM | POA: Diagnosis not present

## 2023-01-09 DIAGNOSIS — D649 Anemia, unspecified: Secondary | ICD-10-CM | POA: Diagnosis not present

## 2023-01-09 DIAGNOSIS — D638 Anemia in other chronic diseases classified elsewhere: Secondary | ICD-10-CM | POA: Diagnosis not present

## 2023-01-23 ENCOUNTER — Other Ambulatory Visit (HOSPITAL_COMMUNITY): Payer: Self-pay | Admitting: Gerontology

## 2023-01-23 DIAGNOSIS — J329 Chronic sinusitis, unspecified: Secondary | ICD-10-CM | POA: Diagnosis not present

## 2023-01-23 DIAGNOSIS — F17200 Nicotine dependence, unspecified, uncomplicated: Secondary | ICD-10-CM | POA: Diagnosis not present

## 2023-01-23 DIAGNOSIS — J3489 Other specified disorders of nose and nasal sinuses: Secondary | ICD-10-CM | POA: Diagnosis not present

## 2023-01-23 DIAGNOSIS — E785 Hyperlipidemia, unspecified: Secondary | ICD-10-CM | POA: Diagnosis not present

## 2023-01-23 DIAGNOSIS — K219 Gastro-esophageal reflux disease without esophagitis: Secondary | ICD-10-CM | POA: Diagnosis not present

## 2023-01-23 DIAGNOSIS — Z0001 Encounter for general adult medical examination with abnormal findings: Secondary | ICD-10-CM | POA: Diagnosis not present

## 2023-01-23 DIAGNOSIS — R519 Headache, unspecified: Secondary | ICD-10-CM | POA: Diagnosis not present

## 2023-01-23 DIAGNOSIS — F17210 Nicotine dependence, cigarettes, uncomplicated: Secondary | ICD-10-CM | POA: Diagnosis not present

## 2023-01-23 DIAGNOSIS — Z882 Allergy status to sulfonamides status: Secondary | ICD-10-CM | POA: Diagnosis not present

## 2023-01-23 DIAGNOSIS — J449 Chronic obstructive pulmonary disease, unspecified: Secondary | ICD-10-CM | POA: Diagnosis not present

## 2023-01-23 DIAGNOSIS — G8929 Other chronic pain: Secondary | ICD-10-CM | POA: Diagnosis not present

## 2023-01-23 DIAGNOSIS — Z7982 Long term (current) use of aspirin: Secondary | ICD-10-CM | POA: Diagnosis not present

## 2023-01-23 DIAGNOSIS — I10 Essential (primary) hypertension: Secondary | ICD-10-CM | POA: Diagnosis not present

## 2023-01-23 DIAGNOSIS — M1990 Unspecified osteoarthritis, unspecified site: Secondary | ICD-10-CM | POA: Diagnosis not present

## 2023-01-23 DIAGNOSIS — M542 Cervicalgia: Secondary | ICD-10-CM

## 2023-01-23 DIAGNOSIS — Z885 Allergy status to narcotic agent status: Secondary | ICD-10-CM | POA: Diagnosis not present

## 2023-01-23 DIAGNOSIS — Z1389 Encounter for screening for other disorder: Secondary | ICD-10-CM | POA: Diagnosis not present

## 2023-01-23 DIAGNOSIS — M316 Other giant cell arteritis: Secondary | ICD-10-CM | POA: Diagnosis not present

## 2023-01-27 DIAGNOSIS — I10 Essential (primary) hypertension: Secondary | ICD-10-CM | POA: Diagnosis not present

## 2023-01-27 DIAGNOSIS — M1990 Unspecified osteoarthritis, unspecified site: Secondary | ICD-10-CM | POA: Diagnosis not present

## 2023-01-27 DIAGNOSIS — Z0001 Encounter for general adult medical examination with abnormal findings: Secondary | ICD-10-CM | POA: Diagnosis not present

## 2023-02-06 DIAGNOSIS — M50321 Other cervical disc degeneration at C4-C5 level: Secondary | ICD-10-CM | POA: Diagnosis not present

## 2023-02-06 DIAGNOSIS — M47812 Spondylosis without myelopathy or radiculopathy, cervical region: Secondary | ICD-10-CM | POA: Diagnosis not present

## 2023-02-06 DIAGNOSIS — M542 Cervicalgia: Secondary | ICD-10-CM | POA: Diagnosis not present

## 2023-02-18 ENCOUNTER — Encounter: Payer: Self-pay | Admitting: Orthopaedic Surgery

## 2023-02-18 ENCOUNTER — Ambulatory Visit (INDEPENDENT_AMBULATORY_CARE_PROVIDER_SITE_OTHER): Payer: 59 | Admitting: Orthopaedic Surgery

## 2023-02-18 VITALS — BP 127/81 | HR 71 | Temp 98.5°F | Ht 65.0 in | Wt 117.0 lb

## 2023-02-18 DIAGNOSIS — M542 Cervicalgia: Secondary | ICD-10-CM | POA: Diagnosis not present

## 2023-02-18 NOTE — Progress Notes (Signed)
My neck hurts.  She has had increasing pain to her neck, more on the right side.  She has been seen at [facility] on 02-06-23.  I have copies of the notes.  She had X-rays of her neck showing multilevel degenerative disc and facet disease most pronounced at C4-C5 and C5-C6.  There is a grade I anterolisthesis C3 on C4.  I have reviewed the X-rays and report.  She is not getting any better.  She was placed on indocin for her chronic headaches.  I have reviewed the notes from [facility].  Neck is tender.  She has pain in the right lower side of her neck with limited motion to the right.  NV intact.  Grips normal.  DTRs normal.  Shoulder motion normal.  Encounter Diagnosis  Name Primary?   Cervicalgia Yes   I will get MRI of the neck.  Return in two weeks.  Continue present medicine.  Electronically Signed Darreld Mclean, MD 6/4/20242:49 PM

## 2023-02-21 ENCOUNTER — Telehealth: Payer: Self-pay | Admitting: Orthopaedic Surgery

## 2023-02-21 NOTE — Telephone Encounter (Signed)
Dr. Sanjuan Dame pt - [facility] [phone] lvm stating that they received a referral for a MRI for this patient and they do not have authorization on file.  She would like a call back to let her know if one is needed and if so, what is it.

## 2023-02-25 DIAGNOSIS — M4802 Spinal stenosis, cervical region: Secondary | ICD-10-CM | POA: Diagnosis not present

## 2023-02-25 DIAGNOSIS — M542 Cervicalgia: Secondary | ICD-10-CM | POA: Diagnosis not present

## 2023-03-04 ENCOUNTER — Ambulatory Visit (INDEPENDENT_AMBULATORY_CARE_PROVIDER_SITE_OTHER): Payer: 59 | Admitting: Orthopaedic Surgery

## 2023-03-04 NOTE — Progress Notes (Signed)
Her MRI report is not ready from radiology.  She will return after we get the report.  I did not see her.  No treatment.

## 2023-03-04 NOTE — Progress Notes (Signed)
Erroneous Encounter

## 2023-03-05 ENCOUNTER — Other Ambulatory Visit: Payer: Self-pay | Admitting: Adult Health

## 2023-03-06 ENCOUNTER — Telehealth: Payer: Self-pay

## 2023-03-06 NOTE — Telephone Encounter (Signed)
I found her MRI and printed it out and put it in your box.

## 2023-03-06 NOTE — Telephone Encounter (Signed)
Patient called wanting to see if we had her MRI report yet. I have looked and I don't see where she has had it done at [facility], so I am wondering where she was suppose to hsve

## 2023-03-07 ENCOUNTER — Telehealth: Payer: Self-pay | Admitting: Radiology

## 2023-03-07 ENCOUNTER — Telehealth: Payer: Self-pay

## 2023-03-07 NOTE — Telephone Encounter (Signed)
Spoke to patient and advised her to go to the ED until she is able to see Dr Hilda Lias on Tuesday

## 2023-03-07 NOTE — Telephone Encounter (Signed)
Patient called, asked if we have her MRI report, we do.  MRI done at [facility].   Report shows mild to moderate stenosis, I did tell her this and explain what that means.  She is hurting really bad, says her low back hurts badly now, and she is crying.   I told her we have [facility] walk in clinic available if she cannot wait until Tuesday to see Dr Hilda Lias.  Also reminder her ED and UC available.   She says she will try to wait til Tuesday.  FYI

## 2023-03-10 ENCOUNTER — Ambulatory Visit: Payer: 59 | Admitting: Neurology

## 2023-03-11 ENCOUNTER — Encounter: Payer: Self-pay | Admitting: Orthopaedic Surgery

## 2023-03-11 ENCOUNTER — Ambulatory Visit (INDEPENDENT_AMBULATORY_CARE_PROVIDER_SITE_OTHER): Payer: 59 | Admitting: Orthopaedic Surgery

## 2023-03-11 VITALS — BP 166/97 | HR 64 | Ht 65.0 in | Wt 117.0 lb

## 2023-03-11 DIAGNOSIS — M542 Cervicalgia: Secondary | ICD-10-CM | POA: Diagnosis not present

## 2023-03-11 MED ORDER — HYDROCODONE-ACETAMINOPHEN 5-325 MG PO TABS: ORAL_TABLET | ORAL | 0 refills | Status: DC

## 2023-03-11 NOTE — Progress Notes (Signed)
I still hurt.  She has neck pain. She had MRI at [facility] and we had a delay in getting the report.  She has mild foraminal stenosis at various levels but no HNP.  She is taking Zanaflex and Advil.  She has no new trauma, no weakness.  I have reviewed the MRI.  Neck has full ROM and more tender on the left side.  She has no spasm.  NV intact.  Encounter Diagnosis  Name Primary?   Cervicalgia Yes   I will begin Norco.  I have reviewed the [place] Controlled Substance Reporting System web site prior to prescribing narcotic medicine for this patient.  Return in three weeks.  Call if any problem.  Precautions discussed.  Electronically Signed Darreld Mclean, MD 6/25/20248:19 AM

## 2023-03-11 NOTE — Patient Instructions (Signed)
Steps to Quit Smoking Smoking tobacco is the leading cause of preventable death. It can affect almost every organ in the body. Smoking puts you and people around you at risk for many serious, long-lasting (chronic) diseases. Quitting smoking can be hard, but it is one of the best things that you can do for your health. It is never too late to quit. Do not give up if you cannot quit the first time. Some people need to try many times to quit. Do your best to stick to your quit plan, and talk with your doctor if you have any questions or concerns. How do I get ready to quit? Pick a date to quit. Set a date within the next 2 weeks to give you time to prepare. Write down the reasons why you are quitting. Keep this list in places where you will see it often. Tell your family, friends, and co-workers that you are quitting. Their support is important. Talk with your doctor about the choices that may help you quit. Find out if your health insurance will pay for these treatments. Know the people, places, things, and activities that make you want to smoke (triggers). Avoid them. What first steps can I take to quit smoking? Throw away all cigarettes at home, at work, and in your car. Throw away the things that you use when you smoke, such as ashtrays and lighters. Clean your car. Empty the ashtray. Clean your home, including curtains and carpets. What can I do to help me quit smoking? Talk with your doctor about taking medicines and seeing a counselor. You are more likely to succeed when you do both. If you are pregnant or breastfeeding: Talk with your doctor about counseling or other ways to quit smoking. Do not take medicine to help you quit smoking unless your doctor tells you to. Quit right away Quit smoking completely, instead of slowly cutting back on how much you smoke over a period of time. Stopping smoking right away may be more successful than slowly quitting. Go to counseling. In-person is best  if this is an option. You are more likely to quit if you go to counseling sessions regularly. Take medicine You may take medicines to help you quit. Some medicines need a prescription, and some you can buy over-the-counter. Some medicines may contain a drug called nicotine to replace the nicotine in cigarettes. Medicines may: Help you stop having the desire to smoke (cravings). Help to stop the problems that come when you stop smoking (withdrawal symptoms). Your doctor may ask you to use: Nicotine patches, gum, or lozenges. Nicotine inhalers or sprays. Non-nicotine medicine that you take by mouth. Find resources Find resources and other ways to help you quit smoking and remain smoke-free after you quit. They include: Online chats with a counselor. Phone quitlines. Printed self-help materials. Support groups or group counseling. Text messaging programs. Mobile phone apps. Use apps on your mobile phone or tablet that can help you stick to your quit plan. Examples of free services include Quit Guide from the CDC and smokefree.gov  What can I do to make it easier to quit?  Talk to your family and friends. Ask them to support and encourage you. Call a phone quitline, such as 1-800-QUIT-NOW, reach out to support groups, or work with a counselor. Ask people who smoke to not smoke around you. Avoid places that make you want to smoke, such as: Bars. Parties. Smoke-break areas at work. Spend time with people who do not smoke. Lower   the stress in your life. Stress can make you want to smoke. Try these things to lower stress: Getting regular exercise. Doing deep-breathing exercises. Doing yoga. Meditating. What benefits will I see if I quit smoking? Over time, you may have: A better sense of smell and taste. Less coughing and sore throat. A slower heart rate. Lower blood pressure. Clearer skin. Better breathing. Fewer sick days. Summary Quitting smoking can be hard, but it is one of  the best things that you can do for your health. Do not give up if you cannot quit the first time. Some people need to try many times to quit. When you decide to quit smoking, make a plan to help you succeed. Quit smoking right away, not slowly over a period of time. When you start quitting, get help and support to keep you smoke-free. This information is not intended to replace advice given to you by your health care provider. Make sure you discuss any questions you have with your health care provider. Document Revised: 08/24/2021 Document Reviewed: 08/24/2021 Elsevier Patient Education  2024 Elsevier Inc.  

## 2023-04-01 ENCOUNTER — Ambulatory Visit: Payer: 59 | Admitting: Orthopaedic Surgery

## 2023-04-01 ENCOUNTER — Encounter: Payer: Self-pay | Admitting: Orthopaedic Surgery

## 2023-04-01 ENCOUNTER — Ambulatory Visit (INDEPENDENT_AMBULATORY_CARE_PROVIDER_SITE_OTHER): Payer: 59 | Admitting: Orthopaedic Surgery

## 2023-04-01 VITALS — BP 180/101 | HR 76 | Ht 65.0 in | Wt 117.0 lb

## 2023-04-01 DIAGNOSIS — M542 Cervicalgia: Secondary | ICD-10-CM | POA: Diagnosis not present

## 2023-04-01 MED ORDER — HYDROCODONE-ACETAMINOPHEN 5-325 MG PO TABS: ORAL_TABLET | ORAL | 0 refills | Status: DC

## 2023-04-01 MED ORDER — TIZANIDINE HCL 4 MG PO TABS: ORAL_TABLET | ORAL | 3 refills | Status: DC

## 2023-04-01 NOTE — Progress Notes (Signed)
My neck hurts more.  She has more pain in the neck with spasm. She is out of her Zanaflex and her pain medicine. S he has no new trauma.  MRI has shown no defect.  I will renew her medicine.  I will have her begin PT.  ROM of neck is limited secondary to pain.  NV intact.  Encounter Diagnosis  Name Primary?   Cervicalgia Yes   Begin PT.  I have reviewed the [place] Controlled Substance Reporting System web site prior to prescribing narcotic medicine for this patient.  Return in three weeks.  Call if any problem.  Precautions discussed.  Electronically Signed Darreld Mclean, MD 7/16/20243:20 PM

## 2023-04-02 ENCOUNTER — Ambulatory Visit: Payer: 59 | Admitting: Orthopaedic Surgery

## 2023-04-08 ENCOUNTER — Telehealth: Payer: Self-pay | Admitting: Orthopaedic Surgery

## 2023-04-08 NOTE — Telephone Encounter (Signed)
[facility], orders not sent yet should be sent soon.

## 2023-04-08 NOTE — Telephone Encounter (Signed)
Dr. Sanjuan Dame pt - pt lvm stating that she hasn't heard from therapy.  She would like a call back [phone]

## 2023-04-08 NOTE — Telephone Encounter (Signed)
Dr. Sanjuan Dame pt - pt lvm stating she hasn't heard anything from PT to schedule.

## 2023-04-22 ENCOUNTER — Encounter: Payer: Self-pay | Admitting: Orthopaedic Surgery

## 2023-04-22 ENCOUNTER — Ambulatory Visit (INDEPENDENT_AMBULATORY_CARE_PROVIDER_SITE_OTHER): Payer: 59 | Admitting: Orthopaedic Surgery

## 2023-04-22 VITALS — BP 157/86 | HR 53 | Ht 65.0 in | Wt 117.0 lb

## 2023-04-22 DIAGNOSIS — M542 Cervicalgia: Secondary | ICD-10-CM

## 2023-04-22 MED ORDER — HYDROCODONE-ACETAMINOPHEN 5-325 MG PO TABS: ORAL_TABLET | ORAL | 0 refills | Status: DC

## 2023-04-22 NOTE — Progress Notes (Signed)
I am no better.  She did not go to PT as she did not get a call.  I will have her go to [facility] PT center today in person and make the appointment.  Her neck hurts.  She is out of pain medicine.  ROM of the neck is painful but full.  NV intact.. Grips normal.  No spasm.  Encounter Diagnosis  Name Primary?   Cervicalgia Yes   I have refilled her pain medicine.    Go to PT.  Return in three weeks.  Call if any problem.  Precautions discussed.  Electronically Signed Darreld Mclean, MD 8/6/202410:06 AM

## 2023-04-23 ENCOUNTER — Telehealth: Payer: Self-pay | Admitting: Orthopaedic Surgery

## 2023-04-23 MED ORDER — TRAMADOL HCL 50 MG PO TABS: 50.0000 mg | ORAL_TABLET | Freq: Four times a day (QID) | ORAL | 0 refills | Status: AC | PRN

## 2023-04-23 NOTE — Telephone Encounter (Signed)
DR. Hilda Lias  Patient has called twice one at 1:08 pm and again at 1:20 pm leaving voicemail.   She states her pharmacy does not have her pain medicine and it is on back order and she wants Dr. Hilda Lias to prescribe her something else for pain.  She states she needs her pain medicine

## 2023-04-24 ENCOUNTER — Telehealth: Payer: Self-pay

## 2023-04-24 MED ORDER — TRAMADOL HCL 50 MG PO TABS: 50.0000 mg | ORAL_TABLET | Freq: Four times a day (QID) | ORAL | 0 refills | Status: AC | PRN

## 2023-04-24 NOTE — Telephone Encounter (Signed)
Tramadol 50 MG  Qty 20 Tablets   PATIENT CALLED SAYING [facility] IN [place] HAS THE MEDICATION

## 2023-05-01 ENCOUNTER — Telehealth: Payer: Self-pay | Admitting: Orthopaedic Surgery

## 2023-05-01 NOTE — Telephone Encounter (Signed)
Dr. Sanjuan Dame pt - spoke w/the patient, she stated that she has not been contacted by PT and that she went to PT like Dr. Hilda Lias told her and they stated that they do not have a referral for her.  She would like a call back [phone].

## 2023-05-01 NOTE — Telephone Encounter (Signed)
I have faxed it again for her, and called her to advise. Mail box is full but the orders were faxed to her.

## 2023-05-13 ENCOUNTER — Encounter: Payer: Self-pay | Admitting: Orthopaedic Surgery

## 2023-05-13 ENCOUNTER — Ambulatory Visit (INDEPENDENT_AMBULATORY_CARE_PROVIDER_SITE_OTHER): Payer: 59 | Admitting: Orthopaedic Surgery

## 2023-05-13 VITALS — BP 158/83 | HR 73 | Ht 65.0 in | Wt 114.0 lb

## 2023-05-13 DIAGNOSIS — M25551 Pain in right hip: Secondary | ICD-10-CM | POA: Diagnosis not present

## 2023-05-13 DIAGNOSIS — M542 Cervicalgia: Secondary | ICD-10-CM

## 2023-05-13 MED ORDER — TRAMADOL HCL 50 MG PO TABS: 50.0000 mg | ORAL_TABLET | Freq: Four times a day (QID) | ORAL | 2 refills | Status: AC | PRN

## 2023-05-13 NOTE — Progress Notes (Signed)
I hurt.  She did not go to PT.  There was a mix-up.  I have completed a PT form for her to take to [facility] to the therapist.  She has neck pain with pain in most directions.  NV intact.  She complains of right upper pelvic rim pain laterally.  She is very tender here.  Procedure note: After permission from the patient and prep of the area, I injected the trigger point of the right lateral upper pelvis brim with 1% Xylocaine and 1 cc DepoMedrol 40 by sterile technique tolerated well.  Encounter Diagnoses  Name Primary?   Cervicalgia Yes   Right hip pain    Go to PT.  Return in two weeks.  I have reviewed the [place] Controlled Substance Reporting System web site prior to prescribing narcotic medicine for this patient.  Call if any problem.  Precautions discussed.  Electronically Signed Darreld Mclean, MD 8/27/20249:42 AM

## 2023-05-27 ENCOUNTER — Telehealth: Payer: Self-pay | Admitting: Orthopaedic Surgery

## 2023-05-27 ENCOUNTER — Encounter: Payer: Self-pay | Admitting: Orthopaedic Surgery

## 2023-05-27 ENCOUNTER — Ambulatory Visit (INDEPENDENT_AMBULATORY_CARE_PROVIDER_SITE_OTHER): Payer: 59 | Admitting: Orthopaedic Surgery

## 2023-05-27 VITALS — BP 134/77 | HR 68 | Wt 118.0 lb

## 2023-05-27 DIAGNOSIS — M542 Cervicalgia: Secondary | ICD-10-CM

## 2023-05-27 NOTE — Progress Notes (Signed)
My neck is better and so is the hip  She has had good results from the injection to the trigger point of the pelvis brim.  Her neck has less pain and better motion.  NV intact.  Grips are normal.  Encounter Diagnosis  Name Primary?   Cervicalgia Yes   I will have her stop the Tramadol as she did not "agree" with it.  Use ice.  Continue her exercises.  Return in three weeks.  Call if any problem.  Precautions discussed.  Electronically Signed Darreld Mclean, MD 9/10/202410:22 AM

## 2023-05-27 NOTE — Telephone Encounter (Signed)
At check out patient flagged me down and asked for different pain meds since she is "allergic to codeine"

## 2023-05-28 ENCOUNTER — Telehealth: Payer: Self-pay | Admitting: Orthopaedic Surgery

## 2023-05-28 DIAGNOSIS — M542 Cervicalgia: Secondary | ICD-10-CM | POA: Diagnosis not present

## 2023-05-28 NOTE — Telephone Encounter (Signed)
Dr. Sanjuan Dame pt - patient lvm requesting that something for pain be called in for her. [phone]

## 2023-05-29 NOTE — Telephone Encounter (Signed)
Dr. Sanjuan Dame pt - pt has lvm checking on the status of her refill request for something for pain.

## 2023-06-02 ENCOUNTER — Telehealth: Payer: Self-pay

## 2023-06-02 MED ORDER — TRAMADOL HCL 50 MG PO TABS: 50.0000 mg | ORAL_TABLET | Freq: Four times a day (QID) | ORAL | 0 refills | Status: AC | PRN

## 2023-06-02 NOTE — Telephone Encounter (Signed)
Patient left message asking for something for pain. Stated that she was in a lot of pain.  PATIENT USES [facility] IN [place]

## 2023-06-02 NOTE — Telephone Encounter (Signed)
Called patient about her request for pain meds and her mailbox was full could not leave a message

## 2023-06-04 ENCOUNTER — Telehealth: Payer: Self-pay | Admitting: Radiology

## 2023-06-04 ENCOUNTER — Encounter: Payer: Self-pay | Admitting: Radiology

## 2023-06-04 NOTE — Telephone Encounter (Signed)
Patient called, said she had d/c the tramadol per your instruction.  She is in pain and needs something to help with the pain.  Please advise.

## 2023-06-11 ENCOUNTER — Encounter (INDEPENDENT_AMBULATORY_CARE_PROVIDER_SITE_OTHER): Payer: Self-pay | Admitting: *Deleted

## 2023-06-12 DIAGNOSIS — M542 Cervicalgia: Secondary | ICD-10-CM | POA: Diagnosis not present

## 2023-06-12 NOTE — Progress Notes (Signed)
 This encounter was created in error - please disregard.  This encounter was created in error - please disregard.

## 2023-06-17 ENCOUNTER — Ambulatory Visit: Payer: 59 | Admitting: Orthopaedic Surgery

## 2023-06-18 ENCOUNTER — Ambulatory Visit (INDEPENDENT_AMBULATORY_CARE_PROVIDER_SITE_OTHER): Payer: 59 | Admitting: Orthopaedic Surgery

## 2023-06-18 ENCOUNTER — Encounter: Payer: Self-pay | Admitting: Orthopaedic Surgery

## 2023-06-18 VITALS — BP 145/81 | HR 70 | Ht 65.0 in | Wt 117.0 lb

## 2023-06-18 DIAGNOSIS — M542 Cervicalgia: Secondary | ICD-10-CM

## 2023-06-18 MED ORDER — TRAMADOL HCL 50 MG PO TABS: 50.0000 mg | ORAL_TABLET | Freq: Four times a day (QID) | ORAL | 0 refills | Status: AC | PRN

## 2023-06-18 NOTE — Progress Notes (Signed)
My pain is a little better.  She has been to PT once for her neck and will be seen later today.  She has less pain and no new trauma.  Neck motion is better but still has pain to the left.  She has no spasm.  NV intact.  Grips normal.  Encounter Diagnosis  Name Primary?   Cervicalgia Yes   I have reviewed the [place] Controlled Substance Reporting System web site prior to prescribing narcotic medicine for this patient.  Return in three weeks.  Continue PT.  Call if any problem.  Precautions discussed.  Electronically Signed Darreld Mclean, MD 10/2/20249:08 AM

## 2023-06-19 NOTE — Progress Notes (Signed)
NEUROLOGY FOLLOW UP OFFICE NOTE  Sarah Ochoa [id number]  Assessment/Plan:   Chronic left temporal headache. GCA ruled out.  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 Keep headache diary Follow up 6 months.  Subjective:  Sarah Ochoa is a 62 year old female with COPD, HTN, polysubstance abuse, asthma, chronic back pain, depression and anxiety who follows up for headache.  Accompanied by her daughter who supplements history.  UPDATE: Stopped topiramate due to weight loss.  She saw orthopedics for her neck pain.  X-ray from 02/06/23 showed multilevel degenerative disc and facet disease most pronounced at C4-5 and C5-6.  MRI of cervical spine on 02/25/2023 showed moderate left and mild right foraminal stenosis at C3-4, moderate left foraminal stenosis at C4-5 and mild to moderate left and mild right foraminal stenosis at C5-6 but no significant canal stenosis.  She has undergone PT which has helped.  Using TENS unit. PT has helped with headaches Intensity:  8-10/10 Duration:  20 minutes Frequency:  2 days a week.  No longer daily   Current NSAIDS/analgesics:  ASA 325mg  daily, Tylenol Current triptans:  none Current ergotamine:  none Current anti-emetic:  Zofran ODT 4mg  Current muscle relaxants:  tizanidine 4mg  QHS PRN Current Antihypertensive medications:  amlodipine Current Antidepressant medications:  sertraline 100mg  daily Current Anticonvulsant medications:  gabapentin 300mg  TID Current anti-CGRP:  none Current Antihistamines/Decongestants:  Benadryl, Flonase,  hydroxyzine  HISTORY:  In September 2023, she began having a 10/10 left temporal headache with intermittent blurred vision in her left eye.  Some associated nausea.  She was seen at [facility] where sed rate was found to be 99.   CT head was normal.  CTA head and neck was unremarkable.  MRI of brain revealed chronic paranasal sinus disease and nasal septal cortical erosion but no acute intracranial abnormalities.  She was admitted for temporal arteritis and started on Solu-Medrol 1g daily for 3 days followed by prednisone 60mg  daily for 10 days.  She had a temporal artery biopsy performed which later revealed to be negative for giant cell arteritis.  She was also found to be positive for COVID.     She still has headaches.  Again, left sharp temporal pain with blurred vision in left eye.  Sometimes nausea.  Sensitive to lights but not sounds.  Lasts 20-30 minutes.  They have been occurring daily.  Takes Tylenol daily to treat them.   Seen in [facility] ED on 11/06/2022 for headache and epistaxis.  She was previously seen for the same on 2/19 at [facility] where UDS was positive for cannabinoids and cocaine.  She was also noted to have slurred speech and left sided weakness and numbness.  CT head and follow up MRI of brain were negative for acute stroke.  Denies prior history of headaches.   She was previously admitted to [facility] in June 2019 for questionable stroke.  She presented with babbling speech, shouting, crying and tongue protruding to the right.  Evaluated by tele neurology.  Exam revealed that she was minimally cooperative, would not be able to move her arms but would  later start moving them.  CT head negative for acute findings.  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       PAST MEDICAL HISTORY: Past Medical History:  Diagnosis Date   Acid reflux    Alcohol abuse    Anxiety    Anxiety and depression    Asthma    Avascular necrosis of hip (HCC)     RIGHT   Chronic ankle pain    Chronic back pain    Chronic knee pain    COPD (chronic obstructive pulmonary disease) (HCC)    Depression    History of stomach ulcers    HTN (hypertension)    Left hip pain    Rash    RT UPPER ARM    MEDICATIONS: Current Outpatient Medications on File Prior to Visit  Medication Sig Dispense Refill   acetaminophen (TYLENOL) 500 MG tablet Take 1,000 mg by mouth every 8 (eight) hours as needed for moderate pain or fever.     albuterol (PROVENTIL HFA;VENTOLIN HFA) 108 (90 Base) MCG/ACT inhaler Inhale 1-2 puffs into the lungs every 6 (six) hours as needed for wheezing or shortness of breath. 1 Inhaler 0   albuterol (PROVENTIL) (2.5 MG/3ML) 0.083% nebulizer solution Take 2.5 mg by nebulization every 4 (four) hours as needed for wheezing.   11   amLODipine (NORVASC) 10 MG tablet Take 10 mg by mouth daily.  11   aspirin EC 325 MG tablet Take 325 mg by mouth daily.     BIOTIN PO Take 500 mg by mouth daily.     diphenhydrAMINE (BENADRYL) 25 MG tablet Take 25 mg by mouth every 4 (four) hours as needed for allergies.     estradiol (ESTRACE) 2 MG tablet TAKE 1 TABLET BY MOUTH EVERY DAY 90 tablet 4   famotidine (PEPCID) 20 MG tablet TAKE 1 TABLET BY MOUTH EVERYDAY AT BEDTIME (Patient taking differently: Take 20 mg by mouth at bedtime.) 30 tablet 1   fluticasone (FLONASE) 50 MCG/ACT nasal spray Place 2 sprays into the nose daily as needed for allergies.      gabapentin (NEURONTIN) 300 MG capsule Take 300 mg by mouth 3 (three) times daily.     guaiFENesin (MUCINEX) 600 MG 12 hr tablet Take 600 mg by mouth 2 (two) times daily as needed for cough or to loosen phlegm.     hydrochlorothiazide (HYDRODIURIL) 25 MG tablet Take 25 mg by mouth daily.     hydrOXYzine (ATARAX) 25 MG tablet Take by mouth.     ibuprofen (ADVIL) 800 MG tablet Take 800 mg by mouth every 8 (eight) hours as needed for mild pain or moderate pain.     ipratropium-albuterol (DUONEB) 0.5-2.5 (3) MG/3ML SOLN  Take 3 mLs by nebulization See admin instructions. 4-6 hours as needed for shortness of breath     metoprolol tartrate (LOPRESSOR) 25 MG tablet Take 25 mg by mouth 2 (two) times daily.     montelukast (SINGULAIR) 10 MG tablet Take 10 mg by mouth daily.      Multiple Vitamin (MULTIVITAMIN WITH MINERALS) TABS tablet Take 1 tablet by mouth daily.     ondansetron (ZOFRAN-ODT) 4 MG disintegrating tablet Take 1 tablet (4 mg total) by mouth every 8 (eight) hours as needed for nausea or vomiting. 20 tablet 0   pantoprazole (PROTONIX) 40 MG tablet Take 1 tablet (40 mg total) by mouth daily.     sertraline (ZOLOFT) 100 MG tablet Take 100 mg by mouth daily.  tiZANidine (ZANAFLEX) 4 MG tablet One by mouth every night before bed as needed for spasm 30 tablet 3   topiramate (TOPAMAX) 25 MG tablet Take 1 tablet (25 mg total) by mouth at bedtime. 30 tablet 5   traMADol (ULTRAM) 50 MG tablet Take 1 tablet (50 mg total) by mouth every 6 (six) hours as needed for up to 5 days. One tablet every six hours as needed for pain. 20 tablet 0   TRELEGY ELLIPTA 100-62.5-25 MCG/INH AEPB Inhale 1 puff into the lungs daily as needed (COPD).      No current facility-administered medications on file prior to visit.    ALLERGIES: Allergies  Allergen Reactions   Lisinopril     Angioedema   Codeine Hives   Sulfa Antibiotics Hives   Sulfonamide Derivatives Hives   Sulfamethoxazole Rash    FAMILY HISTORY: Family History  Problem Relation Age of Onset   Migraines Mother    Cancer Mother    Heart attack Father    Cancer Sister    Stroke Sister    Cancer Sister    Diabetes Sister    Cancer Brother        lung   Bursitis Daughter    Cancer Other    Diabetes Other       Objective:  Blood pressure (!) 162/83, pulse 84, height 5\' 5"  (1.651 m), weight 118 lb (53.5 kg), SpO2 100%. General: No acute distress.  Patient appears well-groomed.      Shon Millet, DO  CC: Tesfaye Theora Master, MD

## 2023-06-20 ENCOUNTER — Ambulatory Visit (INDEPENDENT_AMBULATORY_CARE_PROVIDER_SITE_OTHER): Payer: 59 | Admitting: Neurology

## 2023-06-20 ENCOUNTER — Encounter: Payer: Self-pay | Admitting: Neurology

## 2023-06-20 VITALS — BP 162/83 | HR 84 | Ht 65.0 in | Wt 118.0 lb

## 2023-06-20 DIAGNOSIS — R519 Headache, unspecified: Secondary | ICD-10-CM | POA: Diagnosis not present

## 2023-06-20 DIAGNOSIS — M47812 Spondylosis without myelopathy or radiculopathy, cervical region: Secondary | ICD-10-CM | POA: Diagnosis not present

## 2023-06-20 MED ORDER — TIZANIDINE HCL 4 MG PO TABS: ORAL_TABLET | ORAL | 5 refills | Status: DC

## 2023-06-20 NOTE — Patient Instructions (Signed)
Continue tizanidine 4mg  at bedtime but may take 1 tablet during day as needed for headache attack.  Caution for drowsiness Continue physical therapy and neck stretches for neck pain Follow up 6 months.

## 2023-06-24 DIAGNOSIS — M542 Cervicalgia: Secondary | ICD-10-CM | POA: Diagnosis not present

## 2023-07-03 DIAGNOSIS — M542 Cervicalgia: Secondary | ICD-10-CM | POA: Diagnosis not present

## 2023-07-09 ENCOUNTER — Ambulatory Visit: Payer: 59 | Admitting: Orthopaedic Surgery

## 2023-07-28 DIAGNOSIS — I10 Essential (primary) hypertension: Secondary | ICD-10-CM | POA: Diagnosis not present

## 2023-07-28 DIAGNOSIS — J449 Chronic obstructive pulmonary disease, unspecified: Secondary | ICD-10-CM | POA: Diagnosis not present

## 2023-07-28 DIAGNOSIS — M316 Other giant cell arteritis: Secondary | ICD-10-CM | POA: Diagnosis not present

## 2023-07-28 DIAGNOSIS — Z23 Encounter for immunization: Secondary | ICD-10-CM | POA: Diagnosis not present

## 2023-07-28 DIAGNOSIS — K219 Gastro-esophageal reflux disease without esophagitis: Secondary | ICD-10-CM | POA: Diagnosis not present

## 2023-08-25 DIAGNOSIS — R519 Headache, unspecified: Secondary | ICD-10-CM | POA: Diagnosis not present

## 2023-08-25 DIAGNOSIS — Z20822 Contact with and (suspected) exposure to covid-19: Secondary | ICD-10-CM | POA: Diagnosis not present

## 2023-08-25 DIAGNOSIS — J40 Bronchitis, not specified as acute or chronic: Secondary | ICD-10-CM | POA: Diagnosis not present

## 2023-08-27 DIAGNOSIS — I10 Essential (primary) hypertension: Secondary | ICD-10-CM | POA: Diagnosis not present

## 2023-08-27 DIAGNOSIS — J449 Chronic obstructive pulmonary disease, unspecified: Secondary | ICD-10-CM | POA: Diagnosis not present

## 2023-09-07 DIAGNOSIS — M79675 Pain in left toe(s): Secondary | ICD-10-CM | POA: Diagnosis not present

## 2023-09-07 DIAGNOSIS — M109 Gout, unspecified: Secondary | ICD-10-CM | POA: Diagnosis not present

## 2023-09-25 ENCOUNTER — Other Ambulatory Visit: Payer: Self-pay | Admitting: Neurology

## 2023-12-17 NOTE — Progress Notes (Deleted)
 NEUROLOGY FOLLOW UP OFFICE NOTE  Sarah Ochoa [id number]  Assessment/Plan:   Chronic left temporal headache. GCA ruled out.  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 Keep headache diary Follow up 6 months.  Subjective:  Sarah Ochoa is a 63 year old female with COPD, HTN, polysubstance abuse, asthma, chronic back pain, depression and anxiety who follows up for headache.  Accompanied by her daughter who supplements history.  UPDATE: Continued treatment of neck pain with PT, home stretches and tizanidine. *** Intensity:  8-10/10 Duration:  20 minutes Frequency:  2 days a week.  No longer daily   Current NSAIDS/analgesics:  ASA 325mg  daily, Tylenol Current triptans:  none Current ergotamine:  none Current anti-emetic:  Zofran ODT 4mg  Current muscle relaxants:  tizanidine 4mg  QHS PRN Current Antihypertensive medications:  amlodipine Current Antidepressant medications:  sertraline 100mg  daily Current Anticonvulsant medications:  gabapentin 300mg  TID Current anti-CGRP:  none Current Antihistamines/Decongestants:  Benadryl, Flonase, hydroxyzine  HISTORY:  In September 2023, she began having a 10/10 left temporal headache with intermittent blurred vision in her left eye.  Some associated nausea.  She was seen at [facility] where sed rate was found to be 99.   CT head was normal.  CTA head and neck was unremarkable.  MRI of brain revealed chronic paranasal sinus disease and nasal septal cortical erosion but no acute intracranial abnormalities.  She was admitted for temporal arteritis and started on Solu-Medrol 1g daily for 3 days followed  by prednisone 60mg  daily for 10 days.  She had a temporal artery biopsy performed which later revealed to be negative for giant cell arteritis.  She was also found to be positive for COVID.     She still has headaches.  Again, left sharp temporal pain with blurred vision in left eye.  Sometimes nausea.  Sensitive to lights but not sounds.  Lasts 20-30 minutes.  They have been occurring daily.  Takes Tylenol daily to treat them.   Seen in [facility] ED on 11/06/2022 for headache and epistaxis.  She was previously seen for the same on 2/19 at [facility] where UDS was positive for cannabinoids and cocaine.  She was also noted to have slurred speech and left sided weakness and numbness.  CT head and follow up MRI of brain were negative for acute stroke.  Denies prior history of headaches.  She saw orthopedics for her neck pain.  X-ray from 02/06/23 showed multilevel degenerative disc and facet disease most pronounced at C4-5 and C5-6.  MRI of cervical spine on 02/25/2023 showed moderate left and mild right foraminal stenosis at C3-4, moderate left foraminal stenosis at C4-5 and mild to moderate left and mild right foraminal stenosis at C5-6 but no significant canal stenosis.  She has undergone PT which has helped.  Using TENS unit. PT has helped with headaches   She was previously admitted to [facility] in June 2019 for questionable stroke.  She presented with babbling speech, shouting, crying and tongue protruding to the right.  Evaluated by tele neurology.  Exam revealed that she was minimally cooperative, would not be able to move her arms but would later start moving them.  CT head negative for acute findings.  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   Previous medications: Past antiepileptic:  topiramate (weight loss)    PAST MEDICAL HISTORY: Past Medical History:  Diagnosis Date   Acid reflux    Alcohol abuse    Anxiety    Anxiety and depression    Asthma    Avascular  necrosis of hip (HCC)    RIGHT   Chronic ankle pain    Chronic back pain    Chronic knee pain    COPD (chronic obstructive pulmonary disease) (HCC)    Depression    History of stomach ulcers    HTN (hypertension)    Left hip pain    Rash    RT UPPER ARM    MEDICATIONS: Current Outpatient Medications on File Prior to Visit  Medication Sig Dispense Refill   acetaminophen (TYLENOL) 500 MG tablet Take 1,000 mg by mouth every 8 (eight) hours as needed for moderate pain or fever.     albuterol (PROVENTIL HFA;VENTOLIN HFA) 108 (90 Base) MCG/ACT inhaler Inhale 1-2 puffs into the lungs every 6 (six) hours as needed for wheezing or shortness of breath. 1 Inhaler 0   albuterol (PROVENTIL) (2.5 MG/3ML) 0.083% nebulizer solution Take 2.5 mg by nebulization every 4 (four) hours as needed for wheezing.   11   amLODipine (NORVASC) 10 MG tablet Take 10 mg by mouth daily.  11   aspirin EC 81 MG tablet Take 81 mg by mouth daily. Swallow whole.     BIOTIN PO Take 500 mg by mouth daily.     diphenhydrAMINE (BENADRYL) 25 MG tablet Take 25 mg by mouth every 4 (four) hours as needed for allergies.     estradiol (ESTRACE) 2 MG tablet TAKE 1 TABLET BY MOUTH EVERY DAY 90 tablet 4   famotidine (PEPCID) 20 MG tablet TAKE 1 TABLET BY MOUTH EVERYDAY AT BEDTIME (Patient taking differently: Take 20 mg by mouth at bedtime.) 30 tablet 1   fluticasone (FLONASE) 50 MCG/ACT nasal spray Place 2 sprays into the nose daily as needed for allergies.      gabapentin (NEURONTIN) 300 MG capsule Take 300 mg by mouth 3 (three) times daily.     guaiFENesin (MUCINEX) 600 MG 12 hr tablet Take 600 mg by mouth 2 (two) times daily as needed for cough or to loosen phlegm.     hydrochlorothiazide (HYDRODIURIL) 25 MG tablet Take 25 mg by mouth daily.     hydrOXYzine (ATARAX) 25 MG tablet Take by mouth.     ibuprofen (ADVIL) 800 MG tablet Take 800 mg by mouth every 8 (eight) hours as needed for mild pain or moderate pain.      ipratropium-albuterol (DUONEB) 0.5-2.5 (3) MG/3ML SOLN Take 3 mLs by nebulization See admin instructions. 4-6 hours as needed for shortness of breath     metoprolol tartrate (LOPRESSOR) 25 MG tablet Take 25 mg by mouth 2 (two) times daily.     montelukast (SINGULAIR) 10 MG tablet Take 10 mg by mouth daily.      Multiple Vitamin (MULTIVITAMIN WITH MINERALS) TABS tablet Take 1 tablet by mouth daily.     ondansetron (ZOFRAN-ODT) 4 MG disintegrating tablet Take 1 tablet (4 mg total) by mouth every 8 (eight) hours as needed for nausea or vomiting. 20 tablet 0   pantoprazole (PROTONIX) 40 MG tablet Take 1 tablet (40 mg total) by mouth daily.     sertraline (ZOLOFT) 100 MG tablet Take 100 mg by mouth daily.  tiZANidine (ZANAFLEX) 4 MG tablet Take 1 tab at bedtime & 1 tab during the day as needed for headache or neck pain 60 tablet 11   TRELEGY ELLIPTA 100-62.5-25 MCG/INH AEPB Inhale 1 puff into the lungs daily as needed (COPD).      No current facility-administered medications on file prior to visit.    ALLERGIES: Allergies  Allergen Reactions   Lisinopril     Angioedema   Codeine Hives   Sulfa Antibiotics Hives   Sulfonamide Derivatives Hives   Sulfamethoxazole Rash    FAMILY HISTORY: Family History  Problem Relation Age of Onset   Migraines Mother    Cancer Mother    Heart attack Father    Cancer Sister    Stroke Sister    Cancer Sister    Diabetes Sister    Cancer Brother        lung   Bursitis Daughter    Cancer Other    Diabetes Other       Objective:  *** General: No acute distress.  Patient appears well-groomed.   Head:  Normocephalic/atraumatic Neck:  Supple.  No paraspinal tenderness.  Full range of motion. Heart:  Regular rate and rhythm. Neuro:  Alert and oriented.  Speech fluent and not dysarthric.  Language intact.  CN II-XII intact.  Bulk and tone normal.  Muscle strength 5/5 throughout.  Deep tendon reflexes 2+ throughout.  Gait normal.  Romberg  negative.    Shon Millet, DO  CC: Tesfaye Theora Master, MD

## 2023-12-22 ENCOUNTER — Ambulatory Visit: Payer: 59 | Admitting: Neurology

## 2024-02-18 ENCOUNTER — Other Ambulatory Visit (HOSPITAL_COMMUNITY): Payer: Self-pay | Admitting: Gerontology

## 2024-02-18 DIAGNOSIS — K5900 Constipation, unspecified: Secondary | ICD-10-CM

## 2024-03-02 ENCOUNTER — Encounter (HOSPITAL_COMMUNITY): Payer: Self-pay

## 2024-03-02 ENCOUNTER — Ambulatory Visit (HOSPITAL_COMMUNITY): Attending: Gerontology

## 2024-03-27 ENCOUNTER — Other Ambulatory Visit: Payer: Self-pay | Admitting: Adult Health

## 2024-04-01 ENCOUNTER — Ambulatory Visit (HOSPITAL_COMMUNITY): Admission: RE | Admit: 2024-04-01 | Source: Ambulatory Visit

## 2024-07-27 ENCOUNTER — Other Ambulatory Visit: Payer: Self-pay | Admitting: Neurology

## 2024-08-19 ENCOUNTER — Other Ambulatory Visit (HOSPITAL_COMMUNITY): Payer: Self-pay | Admitting: Internal Medicine

## 2024-08-19 ENCOUNTER — Ambulatory Visit (HOSPITAL_COMMUNITY)
Admission: RE | Admit: 2024-08-19 | Discharge: 2024-08-19 | Disposition: A | Source: Ambulatory Visit | Attending: Internal Medicine | Admitting: Internal Medicine

## 2024-08-19 DIAGNOSIS — M25522 Pain in left elbow: Secondary | ICD-10-CM | POA: Insufficient documentation
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
## 2024-09-02 ENCOUNTER — Inpatient Hospital Stay (HOSPITAL_COMMUNITY): Admission: RE | Admit: 2024-09-02 | Discharge: 2024-09-02 | Attending: Ophthalmology

## 2024-09-02 ENCOUNTER — Encounter (HOSPITAL_COMMUNITY): Payer: Self-pay

## 2024-09-02 ENCOUNTER — Other Ambulatory Visit: Payer: Self-pay

## 2024-09-06 ENCOUNTER — Encounter: Payer: Self-pay | Admitting: Neurology

## 2024-09-06 ENCOUNTER — Ambulatory Visit (HOSPITAL_COMMUNITY)
Admission: RE | Admit: 2024-09-06 | Discharge: 2024-09-06 | Disposition: A | Discharge status: Home / Self Care | Attending: Ophthalmology | Admitting: Ophthalmology

## 2024-09-06 ENCOUNTER — Ambulatory Visit (HOSPITAL_COMMUNITY): Admitting: Anesthesiology

## 2024-09-06 ENCOUNTER — Telehealth: Payer: Self-pay | Admitting: Neurology

## 2024-09-06 ENCOUNTER — Encounter (HOSPITAL_COMMUNITY): Payer: Self-pay | Admitting: Ophthalmology

## 2024-09-06 ENCOUNTER — Encounter (HOSPITAL_COMMUNITY): Admission: RE | Payer: Self-pay | Source: Home / Self Care

## 2024-09-06 DIAGNOSIS — I10 Essential (primary) hypertension: Secondary | ICD-10-CM

## 2024-09-06 DIAGNOSIS — F32A Depression, unspecified: Secondary | ICD-10-CM | POA: Diagnosis not present

## 2024-09-06 DIAGNOSIS — J4489 Other specified chronic obstructive pulmonary disease: Secondary | ICD-10-CM | POA: Insufficient documentation

## 2024-09-06 DIAGNOSIS — H52223 Regular astigmatism, bilateral: Secondary | ICD-10-CM | POA: Insufficient documentation

## 2024-09-06 DIAGNOSIS — H40033 Anatomical narrow angle, bilateral: Secondary | ICD-10-CM | POA: Insufficient documentation

## 2024-09-06 DIAGNOSIS — K219 Gastro-esophageal reflux disease without esophagitis: Secondary | ICD-10-CM | POA: Insufficient documentation

## 2024-09-06 DIAGNOSIS — F17200 Nicotine dependence, unspecified, uncomplicated: Secondary | ICD-10-CM | POA: Diagnosis not present

## 2024-09-06 DIAGNOSIS — H2512 Age-related nuclear cataract, left eye: Secondary | ICD-10-CM | POA: Insufficient documentation

## 2024-09-06 DIAGNOSIS — F17210 Nicotine dependence, cigarettes, uncomplicated: Secondary | ICD-10-CM

## 2024-09-06 DIAGNOSIS — F419 Anxiety disorder, unspecified: Secondary | ICD-10-CM | POA: Diagnosis not present

## 2024-09-06 HISTORY — PX: CATARACT EXTRACTION W/PHACO: SHX586

## 2024-09-06 SURGERY — PHACOEMULSIFICATION, CATARACT, WITH IOL INSERTION
Anesthesia: Monitor Anesthesia Care | Site: Eye | Laterality: Left

## 2024-09-06 MED ORDER — LIDOCAINE HCL 3.5 % OP GEL
1.0000 | Freq: Once | OPHTHALMIC | Status: AC
  Administered 2024-09-06: 1 via OPHTHALMIC

## 2024-09-06 MED ORDER — LACTATED RINGERS IV SOLN: INTRAVENOUS | Status: DC

## 2024-09-06 MED ORDER — TETRACAINE HCL 0.5 % OP SOLN
1.0000 [drp] | OPHTHALMIC | Status: AC | PRN
  Administered 2024-09-06 (×3): 1 [drp] via OPHTHALMIC

## 2024-09-06 MED ORDER — SODIUM HYALURONATE 23MG/ML IO SOSY
PREFILLED_SYRINGE | INTRAOCULAR | Status: DC | PRN
  Administered 2024-09-06: .6 mL via INTRAOCULAR

## 2024-09-06 MED ORDER — MOXIFLOXACIN HCL 5 MG/ML IO SOLN
INTRAOCULAR | Status: DC | PRN
  Administered 2024-09-06: .3 mL via INTRACAMERAL

## 2024-09-06 MED ORDER — STERILE WATER FOR IRRIGATION IR SOLN
Status: DC | PRN
  Administered 2024-09-06: 1

## 2024-09-06 MED ORDER — TROPICAMIDE 1 % OP SOLN
1.0000 [drp] | OPHTHALMIC | Status: AC | PRN
  Administered 2024-09-06 (×3): 1 [drp] via OPHTHALMIC

## 2024-09-06 MED ORDER — LIDOCAINE HCL (PF) 1 % IJ SOLN
INTRAMUSCULAR | Status: DC | PRN
  Administered 2024-09-06: 1 mL

## 2024-09-06 MED ORDER — PHENYLEPHRINE HCL 2.5 % OP SOLN
1.0000 [drp] | OPHTHALMIC | Status: AC | PRN
  Administered 2024-09-06 (×3): 1 [drp] via OPHTHALMIC

## 2024-09-06 MED ORDER — POVIDONE-IODINE 5 % OP SOLN
OPHTHALMIC | Status: DC | PRN
  Administered 2024-09-06: 1 via OPHTHALMIC

## 2024-09-06 MED ORDER — PHENYLEPHRINE-KETOROLAC 1-0.3 % IO SOLN
INTRAOCULAR | Status: DC | PRN
  Administered 2024-09-06: 500 mL via OPHTHALMIC

## 2024-09-06 MED ORDER — BSS IO SOLN
INTRAOCULAR | Status: DC | PRN
  Administered 2024-09-06: 15 mL via INTRAOCULAR

## 2024-09-06 MED ADMIN — Sodium Hyaluronate Intraocular Soln Pref Syr 5.5 MG/0.55ML: .85 mL | INTRAOCULAR | NDC 5047463697

## 2024-09-06 SURGICAL SUPPLY — 11 items
CLOTH BEACON ORANGE TIMEOUT ST (SAFETY) ×1 IMPLANT
EYE SHIELD UNIVERSAL CLEAR (GAUZE/BANDAGES/DRESSINGS) IMPLANT
FEE CATARACT SUITE SIGHTPATH (MISCELLANEOUS) ×1 IMPLANT
GLOVE BIOGEL PI IND STRL 7.0 (GLOVE) ×2 IMPLANT
LENS IOL TECNIS EYHANCE 25.5 (Intraocular Lens) IMPLANT
NDL HYPO 18GX1.5 BLUNT FILL (NEEDLE) ×1 IMPLANT
NEEDLE HYPO 18GX1.5 BLUNT FILL (NEEDLE) ×1 IMPLANT
PAD ARMBOARD POSITIONER FOAM (MISCELLANEOUS) ×1 IMPLANT
SYR TB 1ML LL NO SAFETY (SYRINGE) ×1 IMPLANT
TAPE SURG TRANSPORE 1 IN (GAUZE/BANDAGES/DRESSINGS) IMPLANT
WATER STERILE IRR 250ML POUR (IV SOLUTION) ×1 IMPLANT

## 2024-09-06 NOTE — Anesthesia Preprocedure Evaluation (Signed)
"                                    Anesthesia Evaluation  Patient identified by MRN, date of birth, ID band Patient awake    Reviewed: Allergy & Precautions, H&P , NPO status , Patient's Chart, lab work & pertinent test results, reviewed documented beta blocker date and time   Airway Mallampati: II  TM Distance: >3 FB Neck ROM: full    Dental no notable dental hx.    Pulmonary asthma , COPD, Current Smoker   Pulmonary exam normal breath sounds clear to auscultation       Cardiovascular Exercise Tolerance: Good hypertension,  Rhythm:regular Rate:Normal     Neuro/Psych  Headaches PSYCHIATRIC DISORDERS Anxiety Depression     Neuromuscular disease    GI/Hepatic Neg liver ROS,GERD  ,,  Endo/Other  negative endocrine ROS    Renal/GU negative Renal ROS  negative genitourinary   Musculoskeletal   Abdominal   Peds  Hematology negative hematology ROS (+)   Anesthesia Other Findings   Reproductive/Obstetrics negative OB ROS                              Anesthesia Physical Anesthesia Plan  ASA: 3  Anesthesia Plan: MAC   Post-op Pain Management:    Induction:   PONV Risk Score and Plan:   Airway Management Planned:   Additional Equipment:   Intra-op Plan:   Post-operative Plan:   Informed Consent: I have reviewed the patients History and Physical, chart, labs and discussed the procedure including the risks, benefits and alternatives for the proposed anesthesia with the patient or authorized representative who has indicated his/her understanding and acceptance.     Dental Advisory Given  Plan Discussed with: CRNA  Anesthesia Plan Comments:         Anesthesia Quick Evaluation  "

## 2024-09-06 NOTE — Interval H&P Note (Signed)
 History and Physical Interval Note:  09/06/2024 8:50 AM  Sarah Ochoa  has presented today for surgery, with the diagnosis of nuclear sclerotic cataract, left eye.  The various methods of treatment have been discussed with the patient and family. After consideration of risks, benefits and other options for treatment, the patient has consented to  Procedures: PHACOEMULSIFICATION, CATARACT, WITH IOL INSERTION (Left) as a surgical intervention.  The patient's history has been reviewed, patient examined, no change in status, stable for surgery.  I have reviewed the patient's chart and labs.  Questions were answered to the patient's satisfaction.     HARRIE AGENT

## 2024-09-06 NOTE — Transfer of Care (Signed)
 Immediate Anesthesia Transfer of Care Note  Patient: Sarah Ochoa  Procedure(s) Performed: PHACOEMULSIFICATION, CATARACT, WITH IOL INSERTION (Left: Eye)  Patient Location: PACU  Anesthesia Type:General  Level of Consciousness: awake, alert , and patient cooperative  Airway & Oxygen  Therapy: Patient Spontanous Breathing  Post-op Assessment: Report given to RN, Post -op Vital signs reviewed and stable, and Patient moving all extremities X 4  Post vital signs: Reviewed and stable  Last Vitals:  Vitals Value Taken Time  BP 182/87 09/06/24 09:22  Temp 37.1 C 09/06/24 09:22  Pulse 49 09/06/24 09:22  Resp 14 09/06/24 09:22  SpO2 100 % 09/06/24 09:22    Last Pain:  Vitals:   09/06/24 0922  TempSrc: Oral  PainSc: 0-No pain      Patients Stated Pain Goal: 5 (09/06/24 0837)  Complications: No notable events documented.

## 2024-09-06 NOTE — Telephone Encounter (Signed)
 [facility] call ID: [id number]  Caller states she missed a call from the office.   Ph: [phone]

## 2024-09-06 NOTE — Discharge Instructions (Addendum)
 Please discharge patient when stable, will follow up today with Dr. June Leap at the [facility] office immediately following discharge.  Leave shield in place until visit.  All paperwork with discharge instructions will be given at the office.  [facility] Address:  [address]  [place], [place] [address]

## 2024-09-06 NOTE — Op Note (Signed)
 Date of procedure: 09/06/2024  Pre-operative diagnosis: Visually significant age-related nuclear cataract, Left Eye (H25.12)  Post-operative diagnosis: Visually significant age-related nuclear cataract, Left Eye  Procedure: Removal of cataract via phacoemulsification and insertion of intra-ocular lens Vicci and Johnson DIB00 +25.5D into the capsular bag of the Left Eye  Attending surgeon: Lynwood LABOR. Rhylee Nunn, MD, MA  Anesthesia: MAC, Topical Akten   Complications: None  Estimated Blood Loss: <26mL (minimal)  Specimens: None  Implants: As above  Indications:  Visually significant age-related cataract, Left Eye  Procedure:  The patient was seen and identified in the pre-operative area. The operative eye was identified and dilated.  The operative eye was marked.  Topical anesthesia was administered to the operative eye.     The patient was then to the operative suite and placed in the supine position.  A timeout was performed confirming the patient, procedure to be performed, and all other relevant information.   The patient's face was prepped and draped in the usual fashion for intra-ocular surgery.  A lid speculum was placed into the operative eye and the surgical microscope moved into place and focused.  An inferotemporal paracentesis was created using a 20 gauge paracentesis blade. Omidria  was injected into the anterior chamber. Shugarcaine was injected into the anterior chamber.  Viscoelastic was injected into the anterior chamber.  A temporal clear-corneal main wound incision was created using a 2.57mm microkeratome.  A continuous curvilinear capsulorrhexis was initiated using an irrigating cystitome and completed using capsulorrhexis forceps.  Hydrodissection and hydrodeliniation were performed.  Viscoelastic was injected into the anterior chamber.  A phacoemulsification handpiece and a chopper as a second instrument were used to remove the nucleus and epinucleus. The irrigation/aspiration  handpiece was used to remove any remaining cortical material.   The capsular bag was reinflated with viscoelastic, checked, and found to be intact.  The intraocular lens was inserted into the capsular bag.  The irrigation/aspiration handpiece was used to remove any remaining viscoelastic.  The clear corneal wound and paracentesis wounds were then hydrated and checked with Weck-Cels to be watertight. 0.1mL of moxifloxacin  was injected into the anterior chamber. The lid-speculum and drape was removed, and the patient's face was cleaned with a wet and dry 4x4. A clear shield was taped over the eye. The patient was taken to the post-operative care unit in good condition, having tolerated the procedure well.  Post-Op Instructions: The patient will follow up at [facility] for a same day post-operative evaluation and will receive all other orders and instructions.

## 2024-09-07 ENCOUNTER — Encounter (HOSPITAL_COMMUNITY): Payer: Self-pay | Admitting: Ophthalmology

## 2024-09-10 NOTE — Anesthesia Postprocedure Evaluation (Signed)
"   Anesthesia Post Note  Patient: Sarah Ochoa  Procedure(s) Performed: PHACOEMULSIFICATION, CATARACT, WITH IOL INSERTION (Left: Eye)  Patient location during evaluation: Phase II Anesthesia Type: MAC Level of consciousness: awake Pain management: pain level controlled Vital Signs Assessment: post-procedure vital signs reviewed and stable Respiratory status: spontaneous breathing and respiratory function stable Cardiovascular status: blood pressure returned to baseline and stable Postop Assessment: no headache and no apparent nausea or vomiting Anesthetic complications: no Comments: Late entry   No notable events documented.   Last Vitals:  Vitals:   09/06/24 0837 09/06/24 0922  BP: (!) 185/86 (!) 182/87  Pulse: (!) 54 (!) 49  Resp: 14 14  Temp: 36.4 C 37.1 C  SpO2: 100% 100%    Last Pain:  Vitals:   09/07/24 1436  TempSrc:   PainSc: 1                  Yvonna PARAS Lashante Fryberger      "

## 2024-09-14 ENCOUNTER — Encounter (INDEPENDENT_AMBULATORY_CARE_PROVIDER_SITE_OTHER): Payer: Self-pay | Admitting: *Deleted

## 2024-09-20 ENCOUNTER — Encounter (HOSPITAL_COMMUNITY)
Admission: RE | Admit: 2024-09-20 | Discharge: 2024-09-20 | Disposition: A | Discharge status: Home / Self Care | Source: Ambulatory Visit | Attending: Ophthalmology | Admitting: Ophthalmology
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
## 2024-09-27 ENCOUNTER — Encounter (HOSPITAL_COMMUNITY): Payer: Self-pay | Admitting: Ophthalmology

## 2024-09-27 ENCOUNTER — Other Ambulatory Visit: Payer: Self-pay

## 2024-09-27 ENCOUNTER — Ambulatory Visit (HOSPITAL_COMMUNITY)
Admission: RE | Admit: 2024-09-27 | Discharge: 2024-09-27 | Disposition: A | Discharge status: Home / Self Care | Attending: Ophthalmology | Admitting: Ophthalmology

## 2024-09-27 ENCOUNTER — Encounter (HOSPITAL_COMMUNITY)
Admission: RE | Disposition: A | Discharge status: Home / Self Care | Payer: Self-pay | Source: Home / Self Care | Attending: Ophthalmology

## 2024-09-27 ENCOUNTER — Encounter (HOSPITAL_COMMUNITY): Admitting: Anesthesiology

## 2024-09-27 ENCOUNTER — Ambulatory Visit (HOSPITAL_COMMUNITY): Admitting: Anesthesiology

## 2024-09-27 DIAGNOSIS — F418 Other specified anxiety disorders: Secondary | ICD-10-CM | POA: Insufficient documentation

## 2024-09-27 DIAGNOSIS — Z9842 Cataract extraction status, left eye: Secondary | ICD-10-CM | POA: Insufficient documentation

## 2024-09-27 DIAGNOSIS — F17200 Nicotine dependence, unspecified, uncomplicated: Secondary | ICD-10-CM | POA: Insufficient documentation

## 2024-09-27 DIAGNOSIS — I10 Essential (primary) hypertension: Secondary | ICD-10-CM | POA: Insufficient documentation

## 2024-09-27 DIAGNOSIS — H2511 Age-related nuclear cataract, right eye: Secondary | ICD-10-CM | POA: Insufficient documentation

## 2024-09-27 DIAGNOSIS — J4489 Other specified chronic obstructive pulmonary disease: Secondary | ICD-10-CM | POA: Diagnosis not present

## 2024-09-27 DIAGNOSIS — K219 Gastro-esophageal reflux disease without esophagitis: Secondary | ICD-10-CM | POA: Insufficient documentation

## 2024-09-27 DIAGNOSIS — G709 Myoneural disorder, unspecified: Secondary | ICD-10-CM | POA: Insufficient documentation

## 2024-09-27 HISTORY — PX: CATARACT EXTRACTION W/PHACO: SHX586

## 2024-09-27 SURGERY — PHACOEMULSIFICATION, CATARACT, WITH IOL INSERTION
Anesthesia: Monitor Anesthesia Care | Site: Eye | Laterality: Right

## 2024-09-27 MED ORDER — PROPOFOL 10 MG/ML IV BOLUS
INTRAVENOUS | Status: DC | PRN
  Administered 2024-09-27: 200 mg via INTRAVENOUS

## 2024-09-27 MED ORDER — PHENYLEPHRINE HCL 2.5 % OP SOLN
1.0000 [drp] | OPHTHALMIC | Status: AC | PRN
  Administered 2024-09-27 (×3): 1 [drp] via OPHTHALMIC

## 2024-09-27 MED ORDER — STERILE WATER FOR IRRIGATION IR SOLN
Status: DC | PRN
  Administered 2024-09-27: 1

## 2024-09-27 MED ORDER — BSS IO SOLN
INTRAOCULAR | Status: DC | PRN
  Administered 2024-09-27: 15 mL via INTRAOCULAR

## 2024-09-27 MED ORDER — MIDAZOLAM HCL 2 MG/2ML IJ SOLN
INTRAMUSCULAR | Status: AC
  Filled 2024-09-27: qty 2

## 2024-09-27 MED ORDER — MIDAZOLAM HCL (PF) 2 MG/2ML IJ SOLN
INTRAMUSCULAR | Status: DC | PRN
  Administered 2024-09-27 (×2): 1 mg via INTRAVENOUS

## 2024-09-27 MED ORDER — MOXIFLOXACIN HCL 5 MG/ML IO SOLN
INTRAOCULAR | Status: DC | PRN
  Administered 2024-09-27: .2 mL via INTRACAMERAL

## 2024-09-27 MED ORDER — TETRACAINE HCL 0.5 % OP SOLN
1.0000 [drp] | OPHTHALMIC | Status: AC | PRN
  Administered 2024-09-27 (×3): 1 [drp] via OPHTHALMIC

## 2024-09-27 MED ORDER — PROPOFOL 10 MG/ML IV BOLUS
INTRAVENOUS | Status: AC
  Filled 2024-09-27: qty 20

## 2024-09-27 MED ORDER — LIDOCAINE HCL (PF) 1 % IJ SOLN
INTRAMUSCULAR | Status: DC | PRN
  Administered 2024-09-27: 1 mL

## 2024-09-27 MED ORDER — SODIUM HYALURONATE 23MG/ML IO SOSY
PREFILLED_SYRINGE | INTRAOCULAR | Status: DC | PRN
  Administered 2024-09-27: .6 mL via INTRAOCULAR

## 2024-09-27 MED ORDER — LACTATED RINGERS IV SOLN: INTRAVENOUS | Status: DC

## 2024-09-27 MED ORDER — FLUMAZENIL 0.5 MG/5ML IV SOLN
INTRAVENOUS | Status: AC
  Filled 2024-09-27: qty 5

## 2024-09-27 MED ORDER — HYDRALAZINE HCL 20 MG/ML IJ SOLN
INTRAMUSCULAR | Status: AC
  Filled 2024-09-27: qty 1

## 2024-09-27 MED ORDER — SODIUM HYALURONATE 10 MG/ML IO SOLUTION
PREFILLED_SYRINGE | INTRAOCULAR | Status: DC | PRN
  Administered 2024-09-27: .85 mL via INTRAOCULAR

## 2024-09-27 MED ORDER — HYDRALAZINE HCL 20 MG/ML IJ SOLN
10.0000 mg | Freq: Once | INTRAMUSCULAR | Status: AC
  Administered 2024-09-27: 10 mg via INTRAVENOUS

## 2024-09-27 MED ORDER — PHENYLEPHRINE-KETOROLAC 1-0.3 % IO SOLN
INTRAOCULAR | Status: DC | PRN
  Administered 2024-09-27: 500 mL via OPHTHALMIC

## 2024-09-27 MED ORDER — PHENYLEPHRINE 80 MCG/ML (10ML) SYRINGE FOR IV PUSH (FOR BLOOD PRESSURE SUPPORT)
PREFILLED_SYRINGE | INTRAVENOUS | Status: DC | PRN
  Administered 2024-09-27: 160 ug via INTRAVENOUS
  Administered 2024-09-27: 80 ug via INTRAVENOUS
  Administered 2024-09-27: 160 ug via INTRAVENOUS

## 2024-09-27 MED ORDER — SODIUM CHLORIDE 0.9% FLUSH
INTRAVENOUS | Status: DC | PRN
  Administered 2024-09-27: 10 mL via INTRAVENOUS
  Administered 2024-09-27 (×2): 3 mL via INTRAVENOUS

## 2024-09-27 MED ORDER — POVIDONE-IODINE 5 % OP SOLN
OPHTHALMIC | Status: DC | PRN
  Administered 2024-09-27: 1 via OPHTHALMIC

## 2024-09-27 MED ORDER — LIDOCAINE HCL 3.5 % OP GEL
1.0000 | Freq: Once | OPHTHALMIC | Status: AC
  Administered 2024-09-27: 1 via OPHTHALMIC

## 2024-09-27 MED ORDER — TROPICAMIDE 1 % OP SOLN
1.0000 [drp] | OPHTHALMIC | Status: AC | PRN
  Administered 2024-09-27 (×3): 1 [drp] via OPHTHALMIC

## 2024-09-27 SURGICAL SUPPLY — 11 items
CLOTH BEACON ORANGE TIMEOUT ST (SAFETY) ×1 IMPLANT
EYE SHIELD UNIVERSAL CLEAR (GAUZE/BANDAGES/DRESSINGS) IMPLANT
FEE CATARACT SUITE SIGHTPATH (MISCELLANEOUS) ×1 IMPLANT
GLOVE BIOGEL PI IND STRL 7.0 (GLOVE) ×2 IMPLANT
LENS IOL TECNIS EYHANCE 25.0 (Intraocular Lens) IMPLANT
NEEDLE HYPO 18GX1.5 BLUNT FILL (NEEDLE) ×1 IMPLANT
PAD ARMBOARD POSITIONER FOAM (MISCELLANEOUS) ×1 IMPLANT
SYR TB 1ML LL NO SAFETY (SYRINGE) ×1 IMPLANT
TAPE SURG TRANSPORE 1 IN (GAUZE/BANDAGES/DRESSINGS) IMPLANT
TIP IRRIGATON/ASPIRATION (MISCELLANEOUS) IMPLANT
WATER STERILE IRR 250ML POUR (IV SOLUTION) ×1 IMPLANT

## 2024-09-27 NOTE — Anesthesia Preprocedure Evaluation (Signed)
"                                    Anesthesia Evaluation  Patient identified by MRN, date of birth, ID band Patient awake    Reviewed: Allergy & Precautions, H&P , NPO status , Patient's Chart, lab work & pertinent test results, reviewed documented beta blocker date and time   Airway Mallampati: II  TM Distance: >3 FB Neck ROM: full    Dental no notable dental hx.    Pulmonary asthma , COPD, Current Smoker   Pulmonary exam normal breath sounds clear to auscultation       Cardiovascular Exercise Tolerance: Good hypertension,  Rhythm:regular Rate:Normal     Neuro/Psych  Headaches PSYCHIATRIC DISORDERS Anxiety Depression     Neuromuscular disease    GI/Hepatic Neg liver ROS,GERD  ,,  Endo/Other  negative endocrine ROS    Renal/GU negative Renal ROS  negative genitourinary   Musculoskeletal   Abdominal   Peds  Hematology negative hematology ROS (+)   Anesthesia Other Findings   Reproductive/Obstetrics negative OB ROS                              Anesthesia Physical Anesthesia Plan  ASA: 3  Anesthesia Plan: MAC   Post-op Pain Management:    Induction:   PONV Risk Score and Plan:   Airway Management Planned:   Additional Equipment:   Intra-op Plan:   Post-operative Plan:   Informed Consent: I have reviewed the patients History and Physical, chart, labs and discussed the procedure including the risks, benefits and alternatives for the proposed anesthesia with the patient or authorized representative who has indicated his/her understanding and acceptance.     Dental Advisory Given  Plan Discussed with: CRNA  Anesthesia Plan Comments:         Anesthesia Quick Evaluation  "

## 2024-09-27 NOTE — Interval H&P Note (Signed)
 History and Physical Interval Note:  09/27/2024 8:49 AM  Sarah Ochoa  has presented today for surgery, with the diagnosis of nuclear sclerotic cataract, right eye.  The various methods of treatment have been discussed with the patient and family. After consideration of risks, benefits and other options for treatment, the patient has consented to  Procedures: PHACOEMULSIFICATION, CATARACT, WITH IOL INSERTION (Right) as a surgical intervention.  The patient's history has been reviewed, patient examined, no change in status, stable for surgery.  I have reviewed the patient's chart and labs.  Questions were answered to the patient's satisfaction.     HARRIE AGENT

## 2024-09-27 NOTE — Transfer of Care (Signed)
 Immediate Anesthesia Transfer of Care Note  Patient: Sarah Ochoa  Procedure(s) Performed: PHACOEMULSIFICATION, CATARACT, WITH IOL INSERTION (Right: Eye)  Patient Location: PACU  Anesthesia Type:General  Level of Consciousness: drowsy  Airway & Oxygen  Therapy: Patient Spontanous Breathing and Patient connected to nasal cannula oxygen   Post-op Assessment: Report given to RN and Post -op Vital signs reviewed and stable  Post vital signs: Reviewed and stable  Last Vitals:  Vitals Value Taken Time  BP 134/89 09/27/24 09:31  Temp    Pulse 74 09/27/24 09:32  Resp 21 09/27/24 09:32  SpO2 100 % 09/27/24 09:32  Vitals shown include unfiled device data.  Last Pain:  Vitals:   09/27/24 0828  TempSrc: Oral  PainSc: 0-No pain         Complications: No notable events documented.

## 2024-09-27 NOTE — Op Note (Signed)
 Date of procedure: 09/27/2024  Pre-operative diagnosis: Visually significant age-related nuclear cataract, Right Eye (H25.11)  Post-operative diagnosis: Visually significant age-related nuclear cataract, Right Eye  Procedure: Removal of cataract via phacoemulsification and insertion of intra-ocular lens Vicci and Johnson DIB00 +25.0D into the capsular bag of the Right Eye  Attending surgeon: Lynwood LABOR. Mayara Paulson, MD, MA  Anesthesia: MAC, Topical Akten ; General  Complications: None  Estimated Blood Loss: <72mL (minimal)  Specimens: None  Implants: As above  Indications:  Visually significant age-related cataract, Right Eye  Procedure:  The patient was seen and identified in the pre-operative area. The operative eye was identified and dilated.  The operative eye was marked.  Topical anesthesia was administered to the operative eye.     The patient was then to the operative suite and placed in the supine position.  A timeout was performed confirming the patient, procedure to be performed, and all other relevant information.   The patient's face was prepped and draped in the usual fashion for intra-ocular surgery.  A lid speculum was placed into the operative eye and the surgical microscope moved into place and focused.  A superotemporal paracentesis was created using a 20 gauge paracentesis blade. Omidria  was injected into the anterior chamber. Shugarcaine was injected into the anterior chamber.  Viscoelastic was injected into the anterior chamber.  A temporal clear-corneal main wound incision was created using a 2.22mm microkeratome.  A continuous curvilinear capsulorrhexis was initiated using an irrigating cystitome and completed using capsulorrhexis forceps.    At this point, the patient had become uncomfortable and unable to hold still for the surgery. The decision was made to convert to general anesthesia with an LMA. This was accomplished by the anesthesia team. The patient's face was again  fully prepped and draped in a sterile fashion.  Hydrodissection and hydrodeliniation were performed.  Viscoelastic was injected into the anterior chamber.  A phacoemulsification handpiece and a chopper as a second instrument were used to remove the nucleus and epinucleus. The irrigation/aspiration handpiece was used to remove any remaining cortical material.   The capsular bag was reinflated with viscoelastic, checked, and found to be intact.  The intraocular lens was inserted into the capsular bag.  The irrigation/aspiration handpiece was used to remove any remaining viscoelastic.  The clear corneal wound and paracentesis wounds were then hydrated and checked with Weck-Cels to be watertight. 0.1mL of moxifloxacin  was injected into the anterior chamber. The lid-speculum and drape was removed, and the patient's face was cleaned with a wet and dry 4x4.  A clear shield was taped over the eye. The patient was taken to the post-operative care unit in good condition, having tolerated the procedure well.  Post-Op Instructions: The patient will follow up at [facility] for a same day post-operative evaluation and will receive all other orders and instructions.

## 2024-09-27 NOTE — Anesthesia Procedure Notes (Signed)
 Procedure Name: LMA Insertion Date/Time: 09/27/2024 9:09 AM  Performed by: Augusta Daved SAILOR, CRNAPre-anesthesia Checklist: Patient identified, Emergency Drugs available, Suction available and Patient being monitored Patient Re-evaluated:Patient Re-evaluated prior to induction Oxygen  Delivery Method: Circle System Utilized Preoxygenation: Pre-oxygenation with 100% oxygen  Induction Type: IV induction Ventilation: Mask ventilation without difficulty LMA: LMA inserted LMA Size: 4.0 Number of attempts: 1 Airway Equipment and Method: Bite block Placement Confirmation: positive ETCO2 Tube secured with: Tape Dental Injury: Teeth and Oropharynx as per pre-operative assessment

## 2024-09-27 NOTE — Discharge Instructions (Addendum)
 Please discharge patient when stable, will follow up today with Dr. June Leap at the [facility] office immediately following discharge.  Leave shield in place until visit.  All paperwork with discharge instructions will be given at the office.  [facility] Address:  [address]  [place], [place] [address]

## 2024-09-28 ENCOUNTER — Encounter (HOSPITAL_COMMUNITY): Payer: Self-pay | Admitting: Ophthalmology

## 2024-10-01 NOTE — Anesthesia Postprocedure Evaluation (Signed)
"   Anesthesia Post Note  Patient: Sarah Ochoa  Procedure(s) Performed: PHACOEMULSIFICATION, CATARACT, WITH IOL INSERTION (Right: Eye)  Patient location during evaluation: Phase II Anesthesia Type: MAC Level of consciousness: awake Pain management: pain level controlled Vital Signs Assessment: post-procedure vital signs reviewed and stable Respiratory status: spontaneous breathing and respiratory function stable Cardiovascular status: blood pressure returned to baseline and stable Postop Assessment: no headache and no apparent nausea or vomiting Anesthetic complications: no Comments: Late entry   No notable events documented.   Last Vitals:  Vitals:   09/27/24 1021 09/27/24 1023  BP:  (!) 185/89  Pulse: 67 76  Resp: (!) 21 20  Temp:  36.6 C  SpO2: 100% 100%    Last Pain:  Vitals:   09/28/24 1408  TempSrc:   PainSc: 0-No pain                 Yvonna PARAS Kawena Lyday      "

## 2024-10-11 ENCOUNTER — Ambulatory Visit (INDEPENDENT_AMBULATORY_CARE_PROVIDER_SITE_OTHER): Admitting: Gastroenterology

## 2024-10-20 ENCOUNTER — Encounter: Payer: Self-pay | Admitting: Neurology

## 2024-10-20 ENCOUNTER — Ambulatory Visit: Payer: Self-pay | Admitting: Neurology
